# Patient Record
Sex: Female | Born: 1990 | Race: Black or African American | Hispanic: No | Marital: Single | Smoking: Former smoker
Health system: Southern US, Community
[De-identification: ages and names within clinical notes are randomized; demographics above are authoritative.]

## PROBLEM LIST (undated history)

## (undated) DIAGNOSIS — F4310 Post-traumatic stress disorder, unspecified: Secondary | ICD-10-CM

## (undated) DIAGNOSIS — Z789 Other specified health status: Secondary | ICD-10-CM

## (undated) DIAGNOSIS — F419 Anxiety disorder, unspecified: Secondary | ICD-10-CM

## (undated) DIAGNOSIS — F32A Depression, unspecified: Secondary | ICD-10-CM

## (undated) DIAGNOSIS — S4990XA Unspecified injury of shoulder and upper arm, unspecified arm, initial encounter: Secondary | ICD-10-CM

## (undated) HISTORY — DX: Depression, unspecified: F32.A

## (undated) HISTORY — PX: OTHER SURGICAL HISTORY: SHX169

## (undated) HISTORY — DX: Post-traumatic stress disorder, unspecified: F43.10

## (undated) HISTORY — PX: TONSILLECTOMY: SUR1361

## (undated) HISTORY — DX: Anxiety disorder, unspecified: F41.9

---

## 2012-08-28 ENCOUNTER — Emergency Department (HOSPITAL_COMMUNITY)
Admission: EM | Admit: 2012-08-28 | Discharge: 2012-08-29 | Disposition: A | Discharge status: Home / Self Care | Payer: Self-pay | Attending: Emergency Medicine | Admitting: Emergency Medicine

## 2012-08-28 ENCOUNTER — Encounter (HOSPITAL_COMMUNITY): Payer: Self-pay | Admitting: *Deleted

## 2012-08-28 DIAGNOSIS — F17200 Nicotine dependence, unspecified, uncomplicated: Secondary | ICD-10-CM | POA: Insufficient documentation

## 2012-08-28 DIAGNOSIS — R21 Rash and other nonspecific skin eruption: Secondary | ICD-10-CM | POA: Insufficient documentation

## 2012-08-28 MED ORDER — DIPHENHYDRAMINE HCL 25 MG PO CAPS
25.0000 mg | ORAL_CAPSULE | Freq: Once | ORAL | Status: AC
  Administered 2012-08-28: 25 mg via ORAL
  Filled 2012-08-28: qty 1

## 2012-08-28 MED ORDER — ACYCLOVIR 200 MG PO CAPS
400.0000 mg | ORAL_CAPSULE | Freq: Once | ORAL | Status: AC
  Administered 2012-08-28: 400 mg via ORAL
  Filled 2012-08-28: qty 2

## 2012-08-28 NOTE — ED Provider Notes (Signed)
History     CSN: [id number]  Arrival date & time 08/28/12  2151   First MD Initiated Contact with Patient 08/28/12 2307      Chief Complaint  Patient presents with  . Rash    (Consider location/radiation/quality/duration/timing/severity/associated sxs/prior treatment) HPI Comments: Patient with progressive rash on R arm, R breast and R upper back for the past 2 days they start as a small slightly raised area with a white tip than progress to a slightly larger raised bump with a clear fluid filled vesical that ruptures after several hours.   Denies fever, ST recent illness. Only new change is a tattoo to medial R upper arm   Patient is a 21 y.o. female presenting with rash. The history is provided by the patient.  Rash  This is a new problem. The current episode started yesterday. The problem has been gradually worsening. There has been no fever.    History reviewed. No pertinent past medical history.  History reviewed. No pertinent past surgical history.  No family history on file.  History  Substance Use Topics  . Smoking status: Current Some Day Smoker  . Smokeless tobacco: Not on file  . Alcohol Use: Yes    OB History    Grav Para Term Preterm Abortions TAB SAB Ect Mult Living                  Review of Systems  Constitutional: Negative for fever and chills.  HENT: Negative for sore throat.   Respiratory: Negative for shortness of breath.   Gastrointestinal: Negative for nausea.  Musculoskeletal: Negative for myalgias.  Skin: Positive for rash. Negative for wound.  Neurological: Negative for dizziness and headaches.    Allergies  Review of patient's allergies indicates no known allergies.  Home Medications   Current Outpatient Rx  Name  Route  Sig  Dispense  Refill  . HYDROCORTISONE 1 % EX CREA   Topical   Apply 1 application topically at bedtime as needed. For rash         . ACYCLOVIR 200 MG PO CAPS   Oral   Take 2 capsules (400 mg total) by  mouth 5 (five) times daily.   24 capsule   0     BP 115/68  Pulse 78  Temp 98.4 F (36.9 C) (Oral)  Resp 16  SpO2 100%  Physical Exam  Constitutional: She appears well-developed and well-nourished.  HENT:  Head: Normocephalic.  Eyes: Pupils are equal, round, and reactive to light.    Neck: Normal range of motion.  Cardiovascular: Normal rate.   Pulmonary/Chest: Effort normal.  Musculoskeletal: Normal range of motion.  Neurological: She is alert.  Skin: Rash noted. Rash is vesicular. No erythema.       ED Course  Procedures (including critical care time)  Labs Reviewed - No data to display No results found.   1. Rash       MDM  Vesicular rash will start acyclovir         Arman Filter, NP 08/29/12 0003

## 2012-08-28 NOTE — ED Notes (Signed)
Rash on her rt arm  For 2 days with lt eye redness also for one day.  Both itch

## 2012-08-29 ENCOUNTER — Encounter (HOSPITAL_COMMUNITY): Payer: Self-pay | Admitting: Emergency Medicine

## 2012-08-29 ENCOUNTER — Emergency Department (HOSPITAL_COMMUNITY)
Admission: EM | Admit: 2012-08-29 | Discharge: 2012-08-29 | Disposition: A | Discharge status: Home / Self Care | Payer: No Typology Code available for payment source | Attending: Emergency Medicine | Admitting: Emergency Medicine

## 2012-08-29 DIAGNOSIS — B958 Unspecified staphylococcus as the cause of diseases classified elsewhere: Secondary | ICD-10-CM | POA: Insufficient documentation

## 2012-08-29 DIAGNOSIS — F17200 Nicotine dependence, unspecified, uncomplicated: Secondary | ICD-10-CM | POA: Insufficient documentation

## 2012-08-29 MED ORDER — HYDROCODONE-ACETAMINOPHEN 5-325 MG PO TABS: 1.0000 | ORAL_TABLET | ORAL | Status: DC | PRN

## 2012-08-29 MED ORDER — ACYCLOVIR 200 MG PO CAPS: 400.0000 mg | ORAL_CAPSULE | Freq: Every day | ORAL | Status: DC

## 2012-08-29 MED ORDER — SULFAMETHOXAZOLE-TRIMETHOPRIM 800-160 MG PO TABS: 1.0000 | ORAL_TABLET | Freq: Two times a day (BID) | ORAL | Status: DC

## 2012-08-29 NOTE — ED Provider Notes (Signed)
History     CSN: [id number]  Arrival date & time 08/29/12  1845   First MD Initiated Contact with Patient 08/29/12 1913      No chief complaint on file.   (Consider location/radiation/quality/duration/timing/severity/associated sxs/prior treatment) Patient is a 21 y.o. female presenting with rash. The history is provided by the patient.  Rash  This is a new problem. The current episode started 2 days ago. There has been no fever. Associated symptoms comments: Painful rash to the right upper extremity. No swelling of extremity. No fever. She has a tattoo to the arm that was new 2 weeks ago. No fever.Marland Kitchen    History reviewed. No pertinent past medical history.  History reviewed. No pertinent past surgical history.  No family history on file.  History  Substance Use Topics  . Smoking status: Current Some Day Smoker  . Smokeless tobacco: Not on file  . Alcohol Use: Yes    OB History    Grav Para Term Preterm Abortions TAB SAB Ect Mult Living                  Review of Systems  Constitutional: Negative for fever and chills.  Respiratory: Negative.   Cardiovascular: Negative.   Skin: Positive for rash.  Neurological: Negative.     Allergies  Review of patient's allergies indicates no known allergies.  Home Medications   Current Outpatient Rx  Name  Route  Sig  Dispense  Refill  . ACYCLOVIR 200 MG PO CAPS   Oral   Take 400 mg by mouth 5 (five) times daily.         Marland Kitchen HYDROCORTISONE 1 % EX CREA   Topical   Apply 1 application topically at bedtime as needed. For rash           BP 99/60  Pulse 96  Temp 98.3 F (36.8 C)  Resp 20  SpO2 100%  LMP 08/01/2012  Physical Exam  Constitutional: She is oriented to person, place, and time. She appears well-developed and well-nourished.  Neck: Normal range of motion.  Pulmonary/Chest: Effort normal.  Neurological: She is alert and oriented to person, place, and time.  Skin: Skin is warm and dry.       Rash  consisting of singular, erythematous pustules that are tender. No crusting or drainage. Rash extends along volar surface mid-upper arm to mid-forearm. Most lesions are near the new tattoo.  Psychiatric: She has a normal mood and affect.    ED Course  Procedures (including critical care time)  Labs Reviewed - No data to display No results found.   No diagnosis found.  1. Staph infection   MDM  Patient placed on Acyclovir yesterday for ?early shingles. Today the rash appears concerning from staph infection. Will treat with oral abx and recommend topical supportive measures.         Rodena Medin, PA-C 08/29/12 2102

## 2012-08-29 NOTE — ED Provider Notes (Signed)
Medical screening examination/treatment/procedure(s) were performed by non-physician practitioner and as supervising physician I was immediately available for consultation/collaboration.  Maryanna Stuber R. Damyra Luscher, MD 08/29/12 0356 

## 2012-08-29 NOTE — ED Notes (Signed)
Pt has raised bumps on upper right arm that itch and are painful. Was seen yesterday at [facility].

## 2012-08-29 NOTE — ED Notes (Signed)
Pt started Acyclovir started today at 12pm.

## 2012-08-30 NOTE — ED Provider Notes (Signed)
Medical screening examination/treatment/procedure(s) were performed by non-physician practitioner and as supervising physician I was immediately available for consultation/collaboration.  Derwood Kaplan, MD 08/30/12 [phone]

## 2012-10-29 ENCOUNTER — Emergency Department (HOSPITAL_COMMUNITY)
Admission: EM | Admit: 2012-10-29 | Discharge: 2012-10-30 | Disposition: A | Discharge status: Home / Self Care | Payer: Self-pay | Attending: Emergency Medicine | Admitting: Emergency Medicine

## 2012-10-29 ENCOUNTER — Encounter (HOSPITAL_COMMUNITY): Payer: Self-pay | Admitting: *Deleted

## 2012-10-29 DIAGNOSIS — J40 Bronchitis, not specified as acute or chronic: Secondary | ICD-10-CM | POA: Insufficient documentation

## 2012-10-29 DIAGNOSIS — R079 Chest pain, unspecified: Secondary | ICD-10-CM | POA: Insufficient documentation

## 2012-10-29 DIAGNOSIS — R1110 Vomiting, unspecified: Secondary | ICD-10-CM | POA: Insufficient documentation

## 2012-10-29 DIAGNOSIS — Z3202 Encounter for pregnancy test, result negative: Secondary | ICD-10-CM | POA: Insufficient documentation

## 2012-10-29 DIAGNOSIS — Z87891 Personal history of nicotine dependence: Secondary | ICD-10-CM | POA: Insufficient documentation

## 2012-10-29 NOTE — ED Notes (Signed)
Pt c/o cough x 1.5 weeks.  States she coughs until he vomits and chest hurts.  Intermittent fevers at home.

## 2012-10-30 ENCOUNTER — Emergency Department (HOSPITAL_COMMUNITY): Payer: Self-pay

## 2012-10-30 LAB — POCT PREGNANCY, URINE: Preg Test, Ur: NEGATIVE

## 2012-10-30 LAB — PREGNANCY, URINE: Preg Test, Ur: NEGATIVE

## 2012-10-30 MED ORDER — HYDROCOD POLST-CHLORPHEN POLST 10-8 MG/5ML PO LQCR: 5.0000 mL | Freq: Two times a day (BID) | ORAL | Status: DC

## 2012-10-30 MED ORDER — HYDROCOD POLST-CHLORPHEN POLST 10-8 MG/5ML PO LQCR
5.0000 mL | Freq: Once | ORAL | Status: AC
  Administered 2012-10-30: 5 mL via ORAL
  Filled 2012-10-30: qty 5

## 2012-10-30 NOTE — ED Provider Notes (Signed)
History     CSN: [id number]  Arrival date & time 10/29/12  2339   First MD Initiated Contact with Patient 10/29/12 2354      Chief Complaint  Patient presents with  . Cough    (Consider location/radiation/quality/duration/timing/severity/associated sxs/prior treatment) HPI Comments: Patient states, that about a week ago.  She started with URI, symptoms, with a cough, nonproductive.  Her cold symptoms have improved, but the cough has been persistent, intermittent, not related to position.  She is coughing to the point where she is post tussive emesis and, now she's having upper bilateral chest discomfort with coughing.  Denies fever, history of, asthma, or smoking should not use any kind of birth control  Patient is a 22 y.o. female presenting with cough. The history is provided by the patient.  Cough This is a new problem. The current episode started more than 1 week ago. The problem occurs every few minutes. The cough is non-productive. There has been no fever. Associated symptoms include chest pain. Pertinent negatives include no shortness of breath and no wheezing. She has tried cough syrup for the symptoms. The treatment provided no relief.    History reviewed. No pertinent past medical history.  Past Surgical History  Procedure Date  . Tonsillectomy     History reviewed. No pertinent family history.  History  Substance Use Topics  . Smoking status: Former Games developer  . Smokeless tobacco: Not on file  . Alcohol Use: Yes    OB History    Grav Para Term Preterm Abortions TAB SAB Ect Mult Living                  Review of Systems  Constitutional: Negative for fever.  HENT: Negative.   Respiratory: Positive for cough. Negative for shortness of breath and wheezing.   Cardiovascular: Positive for chest pain.  Gastrointestinal: Positive for vomiting. Negative for nausea.  Genitourinary: Negative.   Musculoskeletal: Negative for back pain.  Neurological: Negative for  weakness and numbness.    Allergies  Review of patient's allergies indicates no known allergies.  Home Medications   Current Outpatient Rx  Name  Route  Sig  Dispense  Refill  . OVER THE COUNTER MEDICATION   Oral   Take 1 tablet by mouth every 6 (six) hours as needed. OTC cold medication         . HYDROCOD POLST-CPM POLST ER 10-8 MG/5ML PO LQCR   Oral   Take 5 mLs by mouth every 12 (twelve) hours.   140 mL   0     BP 105/65  Pulse 73  Temp 98.5 F (36.9 C) (Oral)  Resp 20  SpO2 100%  LMP 10/09/2012  Physical Exam  Constitutional: She appears well-developed.  HENT:  Head: Normocephalic.  Mouth/Throat: Oropharynx is clear and moist.  Eyes: Pupils are equal, round, and reactive to light.  Neck: Normal range of motion.  Cardiovascular: Normal rate.   Pulmonary/Chest: Effort normal and breath sounds normal. She has no wheezes. She exhibits tenderness.  Abdominal: Soft.  Musculoskeletal: Normal range of motion.  Neurological: She is alert.  Skin: Skin is warm.    ED Course  Procedures (including critical care time)   Labs Reviewed  PREGNANCY, URINE   Dg Chest 2 View  10/30/2012  *RADIOLOGY REPORT*  Clinical Data: Cough  CHEST - 2 VIEW  Comparison: None.  Findings: Lungs are clear. No pleural effusion or pneumothorax. The cardiomediastinal contours are within normal limits. The visualized bones and soft  tissues are without significant appreciable abnormality.  IMPRESSION: No radiographic evidence of acute cardiopulmonary process.   Original Report Authenticated By: Jearld Lesch, M.D.      1. Bronchitis       MDM  Her symptoms are most consistent with bronchitis.  Will obtain chest x-ray to limit the possibility of a pneumonia        Arman Filter, NP 10/30/12 [phone]

## 2012-10-30 NOTE — ED Provider Notes (Signed)
Medical screening examination/treatment/procedure(s) were performed by non-physician practitioner and as supervising physician I was immediately available for consultation/collaboration.  Tawonna Esquer, MD 10/30/12 0532 

## 2012-12-05 ENCOUNTER — Encounter (HOSPITAL_COMMUNITY): Payer: Self-pay | Admitting: Emergency Medicine

## 2012-12-05 ENCOUNTER — Emergency Department (HOSPITAL_COMMUNITY)
Admission: EM | Admit: 2012-12-05 | Discharge: 2012-12-05 | Disposition: A | Discharge status: Home / Self Care | Payer: Self-pay | Attending: Emergency Medicine | Admitting: Emergency Medicine

## 2012-12-05 DIAGNOSIS — N390 Urinary tract infection, site not specified: Secondary | ICD-10-CM | POA: Insufficient documentation

## 2012-12-05 DIAGNOSIS — R34 Anuria and oliguria: Secondary | ICD-10-CM | POA: Insufficient documentation

## 2012-12-05 DIAGNOSIS — Z87891 Personal history of nicotine dependence: Secondary | ICD-10-CM | POA: Insufficient documentation

## 2012-12-05 LAB — URINALYSIS, ROUTINE W REFLEX MICROSCOPIC
Bilirubin Urine: NEGATIVE
Ketones, ur: NEGATIVE mg/dL
Nitrite: NEGATIVE
Protein, ur: NEGATIVE mg/dL
Urobilinogen, UA: 0.2 mg/dL (ref 0.0–1.0)

## 2012-12-05 LAB — URINE MICROSCOPIC-ADD ON

## 2012-12-05 MED ORDER — CIPROFLOXACIN HCL 500 MG PO TABS: 500.0000 mg | ORAL_TABLET | Freq: Two times a day (BID) | ORAL | Status: DC

## 2012-12-05 MED ORDER — NAPROXEN 375 MG PO TABS: 375.0000 mg | ORAL_TABLET | Freq: Two times a day (BID) | ORAL | Status: DC

## 2012-12-05 NOTE — ED Notes (Addendum)
Pt reports burning after urination for the last few days. Denies fever.

## 2012-12-05 NOTE — ED Provider Notes (Signed)
History    This chart was scribed for non-physician practitioner working with Joya Gaskins, MD by Sofie Rower, ED Scribe. This patient was seen in room TR05C/TR05C and the patient's care was started at 4:21PM.   CSN: [id number]  Arrival date & time 12/05/12  1352   First MD Initiated Contact with Patient 12/05/12 1621      Chief Complaint  Patient presents with  . Dysuria    (Consider location/radiation/quality/duration/timing/severity/associated sxs/prior treatment) The history is provided by the patient. No language interpreter was used.    Rhonda [name] is a 22 y.o. female , with a hx of tonsilectomy, who presents to the Emergency Department complaining of sudden, progressively worsening, dysuria, onset yesterday (12/04/12). Associated symptoms include decreased urinary output. The pt reports she has been experiencing a painful, burning sensation while urinating, since yesterday (12/04/12). The pt has not taken any medications PTA to relieve her Dysuria.  The pt denies nausea, vomiting, back pain, and vaginal discharge. Furthermore, the pt denies any hx of allergies to any medications.  The pt does not smoke, however, she does drink alcohol.      History reviewed. No pertinent past medical history.  Past Surgical History  Procedure Laterality Date  . Tonsillectomy      History reviewed. No pertinent family history.  History  Substance Use Topics  . Smoking status: Former Games developer  . Smokeless tobacco: Not on file  . Alcohol Use: Yes    OB History   Grav Para Term Preterm Abortions TAB SAB Ect Mult Living                  Review of Systems  Genitourinary: Positive for dysuria.  All other systems reviewed and are negative.    Allergies  Review of patient's allergies indicates no known allergies.  Home Medications  No current outpatient prescriptions on file.  BP 105/71  Pulse 70  Temp(Src) 98.2 F (36.8 C) (Oral)  Resp 18  SpO2 100%  LMP  12/01/2012  Physical Exam  Nursing note and vitals reviewed. Constitutional: She is oriented to person, place, and time. She appears well-developed and well-nourished. No distress.  HENT:  Head: Normocephalic and atraumatic.  Eyes: EOM are normal. Pupils are equal, round, and reactive to light.  Neck: Neck supple. No tracheal deviation present.  Cardiovascular: Normal rate.   Pulmonary/Chest: Effort normal. No respiratory distress.  Abdominal: Soft. She exhibits no distension. There is no tenderness.  Musculoskeletal: Normal range of motion. She exhibits no edema.  Neurological: She is alert and oriented to person, place, and time. No sensory deficit.  Skin: Skin is warm and dry.  Psychiatric: She has a normal mood and affect. Her behavior is normal.    ED Course  Procedures (including critical care time)  DIAGNOSTIC STUDIES: Oxygen Saturation is 100% on room air, normal by my interpretation.    COORDINATION OF CARE:  4:42 PM- Treatment plan discussed with patient. Pt agrees with treatment.      Labs Reviewed  URINALYSIS, ROUTINE W REFLEX MICROSCOPIC - Abnormal; Notable for the following:    APPearance HAZY (*)    Hgb urine dipstick MODERATE (*)    Leukocytes, UA SMALL (*)    All other components within normal limits  URINE MICROSCOPIC-ADD ON - Abnormal; Notable for the following:    Squamous Epithelial / LPF FEW (*)    Bacteria, UA MANY (*)    All other components within normal limits  URINE CULTURE   No results found.  No diagnosis found.    MDM  UTI Pt has been diagnosed with a UTI. Pt is afebrile, no CVA tenderness, normotensive, and denies N/V. Pt to be dc home with antibiotics and instructions to follow up with PCP if symptoms persist.       I personally performed the services described in this documentation, which was scribed in my presence. The recorded information has been reviewed and is accurate.      Jaci Carrel, [place] 12/05/12 2302

## 2012-12-07 LAB — URINE CULTURE: Colony Count: >=100000

## 2012-12-07 NOTE — ED Provider Notes (Signed)
Medical screening examination/treatment/procedure(s) were performed by non-physician practitioner and as supervising physician I was immediately available for consultation/collaboration.   Joya Gaskins, MD 12/07/12 [phone]

## 2012-12-08 ENCOUNTER — Telehealth (HOSPITAL_COMMUNITY): Payer: Self-pay | Admitting: Emergency Medicine

## 2012-12-08 NOTE — ED Notes (Signed)
+  Urine. Patient treated with Cipro. Sensitive to same. Per protocol MD. °

## 2012-12-08 NOTE — ED Notes (Signed)
Patient has +Urine culture. Checking to see if treated appropriately. °

## 2013-01-18 ENCOUNTER — Emergency Department (HOSPITAL_COMMUNITY)
Admission: EM | Admit: 2013-01-18 | Discharge: 2013-01-18 | Disposition: A | Discharge status: Home / Self Care | Payer: PRIVATE HEALTH INSURANCE | Attending: Emergency Medicine | Admitting: Emergency Medicine

## 2013-01-18 ENCOUNTER — Encounter (HOSPITAL_COMMUNITY): Payer: Self-pay | Admitting: Emergency Medicine

## 2013-01-18 ENCOUNTER — Emergency Department (HOSPITAL_COMMUNITY): Payer: PRIVATE HEALTH INSURANCE

## 2013-01-18 DIAGNOSIS — Z87891 Personal history of nicotine dependence: Secondary | ICD-10-CM | POA: Insufficient documentation

## 2013-01-18 DIAGNOSIS — W230XXA Caught, crushed, jammed, or pinched between moving objects, initial encounter: Secondary | ICD-10-CM | POA: Insufficient documentation

## 2013-01-18 DIAGNOSIS — S61219A Laceration without foreign body of unspecified finger without damage to nail, initial encounter: Secondary | ICD-10-CM

## 2013-01-18 DIAGNOSIS — S6000XA Contusion of unspecified finger without damage to nail, initial encounter: Secondary | ICD-10-CM

## 2013-01-18 DIAGNOSIS — Y929 Unspecified place or not applicable: Secondary | ICD-10-CM | POA: Insufficient documentation

## 2013-01-18 DIAGNOSIS — Y939 Activity, unspecified: Secondary | ICD-10-CM | POA: Insufficient documentation

## 2013-01-18 DIAGNOSIS — S61209A Unspecified open wound of unspecified finger without damage to nail, initial encounter: Secondary | ICD-10-CM | POA: Insufficient documentation

## 2013-01-18 MED ORDER — HYDROCODONE-ACETAMINOPHEN 5-325 MG PO TABS: 1.0000 | ORAL_TABLET | ORAL | Status: DC | PRN

## 2013-01-18 MED ORDER — OXYCODONE-ACETAMINOPHEN 5-325 MG PO TABS
1.0000 | ORAL_TABLET | Freq: Once | ORAL | Status: AC
  Administered 2013-01-18: 1 via ORAL
  Filled 2013-01-18: qty 1
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
## 2013-01-19 NOTE — ED Provider Notes (Signed)
Medical screening examination/treatment/procedure(s) were performed by non-physician practitioner and as supervising physician I was immediately available for consultation/collaboration.  Raeford Razor, MD 01/19/13 [phone]

## 2013-01-26 ENCOUNTER — Emergency Department (HOSPITAL_COMMUNITY): Admission: EM | Admit: 2013-01-26 | Payer: Self-pay | Source: Home / Self Care

## 2013-02-01 ENCOUNTER — Encounter (HOSPITAL_COMMUNITY): Payer: Self-pay | Admitting: Emergency Medicine

## 2013-02-01 ENCOUNTER — Emergency Department (HOSPITAL_COMMUNITY)
Admission: EM | Admit: 2013-02-01 | Discharge: 2013-02-01 | Disposition: A | Discharge status: Home / Self Care | Payer: No Typology Code available for payment source | Attending: Emergency Medicine | Admitting: Emergency Medicine

## 2013-02-01 DIAGNOSIS — Z4802 Encounter for removal of sutures: Secondary | ICD-10-CM | POA: Insufficient documentation

## 2013-02-01 DIAGNOSIS — Z87891 Personal history of nicotine dependence: Secondary | ICD-10-CM | POA: Insufficient documentation

## 2013-02-01 NOTE — ED Notes (Signed)
Pt alert, returns to have stiches removed from left hand, area appears healed, no s/s of infection noted

## 2013-02-01 NOTE — ED Provider Notes (Signed)
History    This chart was scribed for non-physician practitioner Francee Piccolo working with Lyanne Co, MD by Quintella Reichert, ED Scribe. This patient was seen in room WTR7/WTR7 and the patient's care was started at 10:32 PM .   CSN: [id number]  Arrival date & time 02/01/13  2109      Chief Complaint  Patient presents with  . Suture / Staple Removal    The history is provided by the patient. No language interpreter was used.   Rhonda [name] is a 22 y.o. female who presents to the Emergency Department for suture removal.  Pt had sutures placed in ED on 01/18/2013, and is returning today to have them removed.  She reports mild soreness in the site, but denies numbness, tingling, or weakness in the area.  She has attempted to use warm soaks to relieve pain, with no relief.  Pt denies fever, chills, CP, SOB, abdominal pain, nausea, emesis, diarrhea, weakness, numbness or any other associated symptoms.     History reviewed. No pertinent past medical history.  Past Surgical History  Procedure Laterality Date  . Tonsillectomy      No family history on file.  History  Substance Use Topics  . Smoking status: Former Games developer  . Smokeless tobacco: Not on file  . Alcohol Use: Yes    OB History   Grav Para Term Preterm Abortions TAB SAB Ect Mult Living                  Review of Systems  Constitutional: Negative for fever and chills.  Respiratory: Negative for shortness of breath.   Cardiovascular: Negative for chest pain.  Gastrointestinal: Negative for nausea, vomiting, abdominal pain and diarrhea.  Skin:       Sutures in left hand  Neurological: Negative for weakness and numbness.  All other systems reviewed and are negative.     Allergies  Review of patient's allergies indicates no known allergies.  Home Medications   Current Outpatient Rx  Name  Route  Sig  Dispense  Refill  . ibuprofen (ADVIL,MOTRIN) 200 MG tablet   Oral   Take 600-800 mg by mouth every  8 (eight) hours as needed for pain.         Marland Kitchen HYDROcodone-acetaminophen (NORCO/VICODIN) 5-325 MG per tablet   Oral   Take 1 tablet by mouth every 6 (six) hours as needed for pain.           BP 99/58  Pulse 80  Temp(Src) 98 F (36.7 C)  Resp 16  Ht 5\' 5"  (1.651 m)  Wt 120 lb (54.432 kg)  BMI 19.97 kg/m2  SpO2 99%  LMP 01/29/2013  Physical Exam  Nursing note and vitals reviewed. Constitutional: She is oriented to person, place, and time. She appears well-developed and well-nourished. No distress.  HENT:  Head: Normocephalic and atraumatic.  Eyes: EOM are normal.  Neck: Neck supple. No tracheal deviation present.  Cardiovascular: Normal rate.   Pulmonary/Chest: Effort normal. No respiratory distress.  Musculoskeletal: Normal range of motion.  Neurological: She is alert and oriented to person, place, and time.  Skin: Skin is warm and dry.  Wounds CDI  Psychiatric: She has a normal mood and affect. Her behavior is normal.     ED Course  Procedures (including critical care time)  DIAGNOSTIC STUDIES: Oxygen Saturation is 99% on room air, normal by my interpretation.    COORDINATION OF CARE: 10:33 PM-Began to perform suture removal procedure.  Pt reported that procedure  was too painful, and refused to continue before completion.  Recommended that sutures be removed.  Offered medication for pain and anxiety management in order that procedure may be better-tolerated.  Pt declined. Pt advised she should have the stitches removed to reduce rate of infection or other complications from leaving the sutures in for prolonged period of time. Pt verbalizes understanding and still declines removal.     Labs Reviewed - No data to display No results found.   1. Visit for suture removal       MDM  Pt returns to have sutures attempted to be removed for second time. Left hand wound appears to be healed w/o signs of infection. Attempt was made to remove sutures, but d/t discomfort  pt declined to continue with suture removal. Offered medication for pain and anxiety management in order that procedure may be better-tolerated.  Pt declined. Pt advised she should have the stitches removed to reduce rate of infection or other complications from leaving the sutures in for prolonged period of time. Pt verbalizes understanding and still declines removal. Patient is stable at time of discharge        I personally performed the services described in this documentation, which was scribed in my presence. The recorded information has been reviewed and is accurate.     Lise Auer Athanasia Stanwood, PA-C 02/02/13 0101

## 2013-02-04 NOTE — ED Provider Notes (Signed)
Medical screening examination/treatment/procedure(s) were performed by non-physician practitioner and as supervising physician I was immediately available for consultation/collaboration.   Tomorrow Dehaas M Deysi Soldo, MD 02/04/13 0006 

## 2013-03-14 ENCOUNTER — Emergency Department (HOSPITAL_COMMUNITY)
Admission: EM | Admit: 2013-03-14 | Discharge: 2013-03-14 | Disposition: A | Discharge status: Home / Self Care | Payer: No Typology Code available for payment source | Attending: Emergency Medicine | Admitting: Emergency Medicine

## 2013-03-14 ENCOUNTER — Encounter (HOSPITAL_COMMUNITY): Payer: Self-pay | Admitting: *Deleted

## 2013-03-14 DIAGNOSIS — S161XXA Strain of muscle, fascia and tendon at neck level, initial encounter: Secondary | ICD-10-CM

## 2013-03-14 DIAGNOSIS — Z87891 Personal history of nicotine dependence: Secondary | ICD-10-CM | POA: Insufficient documentation

## 2013-03-14 DIAGNOSIS — S139XXA Sprain of joints and ligaments of unspecified parts of neck, initial encounter: Secondary | ICD-10-CM | POA: Insufficient documentation

## 2013-03-14 DIAGNOSIS — Y92410 Unspecified street and highway as the place of occurrence of the external cause: Secondary | ICD-10-CM | POA: Insufficient documentation

## 2013-03-14 DIAGNOSIS — S0990XA Unspecified injury of head, initial encounter: Secondary | ICD-10-CM | POA: Insufficient documentation

## 2013-03-14 DIAGNOSIS — Y9389 Activity, other specified: Secondary | ICD-10-CM | POA: Insufficient documentation

## 2013-03-14 MED ORDER — IBUPROFEN 400 MG PO TABS: 600.0000 mg | ORAL_TABLET | Freq: Once | ORAL | Status: DC

## 2013-03-14 MED ORDER — IBUPROFEN 400 MG PO TABS
600.0000 mg | ORAL_TABLET | Freq: Once | ORAL | Status: AC
  Administered 2013-03-14: 600 mg via ORAL
  Filled 2013-03-14: qty 2

## 2013-03-14 MED ORDER — IBUPROFEN 600 MG PO TABS: 600.0000 mg | ORAL_TABLET | Freq: Three times a day (TID) | ORAL | Status: DC | PRN

## 2013-03-14 MED ORDER — OXYCODONE-ACETAMINOPHEN 5-325 MG PO TABS
1.0000 | ORAL_TABLET | Freq: Once | ORAL | Status: AC
  Administered 2013-03-14: 1 via ORAL
  Filled 2013-03-14: qty 1
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
## 2014-01-18 ENCOUNTER — Encounter (HOSPITAL_COMMUNITY): Payer: Self-pay | Admitting: Emergency Medicine

## 2014-01-18 ENCOUNTER — Emergency Department (HOSPITAL_COMMUNITY)
Admission: EM | Admit: 2014-01-18 | Discharge: 2014-01-18 | Disposition: A | Discharge status: Home / Self Care | Payer: No Typology Code available for payment source | Attending: Emergency Medicine | Admitting: Emergency Medicine

## 2014-01-18 DIAGNOSIS — E860 Dehydration: Secondary | ICD-10-CM | POA: Insufficient documentation

## 2014-01-18 DIAGNOSIS — Z87891 Personal history of nicotine dependence: Secondary | ICD-10-CM | POA: Insufficient documentation

## 2014-01-18 DIAGNOSIS — K2920 Alcoholic gastritis without bleeding: Secondary | ICD-10-CM

## 2014-01-18 DIAGNOSIS — Z3202 Encounter for pregnancy test, result negative: Secondary | ICD-10-CM | POA: Insufficient documentation

## 2014-01-18 LAB — URINALYSIS, ROUTINE W REFLEX MICROSCOPIC
Bilirubin Urine: NEGATIVE
Glucose, UA: NEGATIVE mg/dL
KETONES UR: 40 mg/dL — AB
Leukocytes, UA: NEGATIVE
NITRITE: NEGATIVE
Protein, ur: NEGATIVE mg/dL
Specific Gravity, Urine: 1.014 (ref 1.005–1.030)
UROBILINOGEN UA: 0.2 mg/dL (ref 0.0–1.0)
pH: 6.5 (ref 5.0–8.0)

## 2014-01-18 LAB — COMPREHENSIVE METABOLIC PANEL
ALBUMIN: 4.3 g/dL (ref 3.5–5.2)
ALT: 43 U/L — ABNORMAL HIGH (ref 0–35)
AST: 52 U/L — ABNORMAL HIGH (ref 0–37)
Alkaline Phosphatase: 65 U/L (ref 39–117)
BILIRUBIN TOTAL: 0.4 mg/dL (ref 0.3–1.2)
BUN: 9 mg/dL (ref 6–23)
CHLORIDE: 103 meq/L (ref 96–112)
CO2: 22 mEq/L (ref 19–32)
CREATININE: 0.75 mg/dL (ref 0.50–1.10)
Calcium: 9.8 mg/dL (ref 8.4–10.5)
GFR calc non Af Amer: >90 mL/min (ref >90)
GLUCOSE: 91 mg/dL (ref 70–99)
Potassium: 4.1 mEq/L (ref 3.7–5.3)
Sodium: 143 mEq/L (ref 137–147)
Total Protein: 7.6 g/dL (ref 6.0–8.3)

## 2014-01-18 LAB — CBC WITH DIFFERENTIAL/PLATELET
BASOS PCT: 0 % (ref 0–1)
Basophils Absolute: 0 10*3/uL (ref 0.0–0.1)
Eosinophils Absolute: 0 10*3/uL (ref 0.0–0.7)
Eosinophils Relative: 0 % (ref 0–5)
HCT: 40 % (ref 36.0–46.0)
HEMOGLOBIN: 14.2 g/dL (ref 12.0–15.0)
Lymphocytes Relative: 8 % — ABNORMAL LOW (ref 12–46)
Lymphs Abs: 1.4 10*3/uL (ref 0.7–4.0)
MCH: 31.3 pg (ref 26.0–34.0)
MCHC: 35.5 g/dL (ref 30.0–36.0)
MCV: 88.1 fL (ref 78.0–100.0)
MONO ABS: 0.4 10*3/uL (ref 0.1–1.0)
Monocytes Relative: 2 % — ABNORMAL LOW (ref 3–12)
Neutro Abs: 14.8 10*3/uL — ABNORMAL HIGH (ref 1.7–7.7)
Neutrophils Relative %: 89 % — ABNORMAL HIGH (ref 43–77)
Platelets: 316 10*3/uL (ref 150–400)
RBC: 4.54 MIL/uL (ref 3.87–5.11)
RDW: 12.6 % (ref 11.5–15.5)
WBC: 16.6 10*3/uL — ABNORMAL HIGH (ref 4.0–10.5)

## 2014-01-18 LAB — POC URINE PREG, ED: Preg Test, Ur: NEGATIVE

## 2014-01-18 LAB — LIPASE, BLOOD: LIPASE: 14 U/L (ref 11–59)

## 2014-01-18 LAB — URINE MICROSCOPIC-ADD ON

## 2014-01-18 MED ORDER — MORPHINE SULFATE 4 MG/ML IJ SOLN
4.0000 mg | Freq: Once | INTRAMUSCULAR | Status: AC
  Administered 2014-01-18: 4 mg via INTRAVENOUS
  Filled 2014-01-18: qty 1

## 2014-01-18 MED ORDER — ONDANSETRON HCL 4 MG/2ML IJ SOLN
4.0000 mg | Freq: Once | INTRAMUSCULAR | Status: AC
  Administered 2014-01-18: 4 mg via INTRAVENOUS
  Filled 2014-01-18: qty 2

## 2014-01-18 MED ORDER — SODIUM CHLORIDE 0.9 % IV BOLUS (SEPSIS)
1000.0000 mL | Freq: Once | INTRAVENOUS | Status: AC
  Administered 2014-01-18: 1000 mL via INTRAVENOUS

## 2014-01-18 NOTE — ED Notes (Signed)
Patient tolerating po well.

## 2014-01-18 NOTE — ED Notes (Signed)
Pt states that she drank too much last night and began throwing up this morning.

## 2014-01-18 NOTE — Discharge Instructions (Signed)
Dehydration, Adult Dehydration means your body does not have as much fluid as it needs. Your kidneys, brain, and heart will not work properly without the right amount of fluids and salt.  HOME CARE  Ask your doctor how to replace body fluid losses (rehydrate).  Drink enough fluids to keep your pee (urine) clear or pale yellow.  Drink small amounts of fluids often if you feel sick to your stomach (nauseous) or throw up (vomit).  Eat like you normally do.  Avoid:  Foods or drinks high in sugar.  Bubbly (carbonated) drinks.  Juice.  Very hot or cold fluids.  Drinks with caffeine.  Fatty, greasy foods.  Alcohol.  Tobacco.  Eating too much.  Gelatin desserts.  Wash your hands to avoid spreading germs (bacteria, viruses).  Only take medicine as told by your doctor.  Keep all doctor visits as told. GET HELP RIGHT AWAY IF:   You cannot drink something without throwing up.  You get worse even with treatment.  Your vomit has blood in it or looks greenish.  Your poop (stool) has blood in it or looks black and tarry.  You have not peed in 6 to 8 hours.  You pee a small amount of very dark pee.  You have a fever.  You pass out (faint).  You have belly (abdominal) pain that gets worse or stays in one spot (localizes).  You have a rash, stiff neck, or bad headache.  You get easily annoyed, sleepy, or are hard to wake up.  You feel weak, dizzy, or very thirsty. MAKE SURE YOU:   Understand these instructions.  Will watch your condition.  Will get help right away if you are not doing well or get worse. Document Released: 07/16/2009 Document Revised: 12/12/2011 Document Reviewed: 05/09/2011 [facility] Patient Information 2014 [place], [place].  Gastritis, Adult Gastritis is soreness and puffiness (inflammation) of the lining of the stomach. If you do not get help, gastritis can cause bleeding and sores (ulcers) in the stomach. HOME CARE   Only take medicine as  told by your doctor.  If you were given antibiotic medicines, take them as told. Finish the medicines even if you start to feel better.  Drink enough fluids to keep your pee (urine) clear or pale yellow.  Avoid foods and drinks that make your problems worse. Foods you may want to avoid include:  Caffeine or alcohol.  Chocolate.  Mint.  Garlic and onions.  Spicy foods.  Citrus fruits, including oranges, lemons, or limes.  Food containing tomatoes, including sauce, chili, salsa, and pizza.  Fried and fatty foods.  Eat small meals throughout the day instead of large meals. GET HELP RIGHT AWAY IF:   You have black or dark red poop (stools).  You throw up (vomit) blood. It may look like coffee grounds.  You cannot keep fluids down.  Your belly (abdominal) pain gets worse.  You have a fever.  You do not feel better after 1 week.  You have any other questions or concerns. MAKE SURE YOU:   Understand these instructions.  Will watch your condition.  Will get help right away if you are not doing well or get worse. Document Released: 03/07/2008 Document Revised: 12/12/2011 Document Reviewed: 11/02/2011 [facility] Patient Information 2014 [place] Summit[place]Care, [place]LLC.   Emergency Department Resource Guide 1) Find a Doctor and Pay Out of Pocket Although you won't have to find out who is covered by your insurance plan, it is a good idea to ask around and get  recommendations. You will then need to call the office and see if the doctor you have chosen will accept you as a new patient and what types of options they offer for patients who are self-pay. Some doctors offer discounts or will set up payment plans for their patients who do not have insurance, but you will need to ask so you aren't surprised when you get to your appointment.  2) Contact Your Local Health Department Not all health departments have doctors that can see patients for sick visits, but many do, so it is worth a call to  see if yours does. If you don't know where your local health department is, you can check in your phone book. The CDC also has a tool to help you locate your state's health department, and many state websites also have listings of all of their local health departments.  3) Find a Walk-in Clinic If your illness is not likely to be very severe or complicated, you may want to try a walk in clinic. These are popping up all over the country in pharmacies, drugstores, and shopping centers. They're usually staffed by nurse practitioners or physician assistants that have been trained to treat common illnesses and complaints. They're usually fairly quick and inexpensive. However, if you have serious medical issues or chronic medical problems, these are probably not your best option.  No Primary Care Doctor: - Call Health Connect at  [phone] - they can help you locate a primary care doctor that  accepts your insurance, provides certain services, etc. - Physician Referral Service- [phone]  Chronic Pain Problems: Organization         Address  Phone   Notes  [facility]  [phone] Patients need to be referred by their primary care doctor.   Medication Assistance: Organization         Address  Phone   Notes  [facility] Medication [facility] [address] [place] [place], [place] [address] [phone] --Must be a resident of [facility] -- Must have NO insurance coverage whatsoever (no Medicaid/ Medicare, etc.) -- The pt. MUST have a primary care doctor that directs their care regularly and follows them in the community   Med[facility]ist  [phone]   [employer]  [phone]    Agencies that provide inexpensive medical care: Organization         Address  Phone   Notes  [facility]  [phone]   [facility]    [phone]   [facility] [address] [place], [place] [address] [phone]   [facility] [address] [place], [place] [phone]   Planned Parenthood    [phone]   [name]    [phone]   [facility]  [address], [place] Phone:  [phone], Fax:  [phone] Hours of Operation:  9 am - 6 pm, M-F.  Also accepts Medicaid/Medicare and self-pay.  [facility]  [address], [place] Phone: [phone], Fax: [phone]. Hours of Operation:  8:30 am - 5:30 pm, M-F.  Also accepts Medicaid and self-pay.  [facility] [place] [address], [place] Phone: [phone]   [facility] [address] Natasha Bence [place], [place] [phone], Ext. [phone] Mondays & Thursdays: 7-9 AM.  First 15 patients are seen on a first  come, first serve basis.    Medicaid-accepting [facility] Providers:  Organization         Address  Phone   Notes  [facility] [address], [place] [phone] Also accepts self-pay patients.  [facility] [address] [name] [place], [place]  [phone]   [facility] [address], [place] [phone]   [facility] [address], [place] [phone]   Renaye Rakers [address], [place]   [phone] Only accepts [place] Access [place] patients after they have their name applied to their card.   Self-Pay (no insurance) in [facility]:  Organization         Address  Phone   Notes  Sickle Cell Patients, [facility] [address] [place], [place] [phone]   [facility] [address] [place], [place] [phone]   [facility] [place]  [address], [place] [phone]   [facility]/Dr. Osei-Bonsu  [address], [place] or [address], [place] [phone] Phone number for both [place] and [place] locations is the same.  [facility] [address] [place] [phone]   [facility] [address] [place] or [address] [phone]   [facility] [address], [place] [phone], phone; [phone], fax Sees patients 1st and 3rd Saturday of every month.  Must not qualify for public or private insurance (i.e. Medicaid, Medicare, [place] Health Choice, Veterans' Benefits)  Household income should be no more than 200% of the poverty level The clinic cannot treat you if you are pregnant or think you are pregnant  Sexually transmitted diseases are not treated at the clinic.    Dental Care: Organization         Address  Phone  Notes  [facility] [address] [place], [place] [phone] Accepts children up to age 58 who are enrolled in [place] or [place] Health Choice; pregnant women with a Medicaid card; and children who have applied for Medicaid or [place] Health Choice, but were declined, whose parents can pay a reduced fee at time of service.  [facility]  [address], [place] [phone] Accepts children up to age 73 who are enrolled in [place] or [place] Health Choice; pregnant women with a Medicaid card; and children who have applied for Medicaid or [place] Health Choice, but were declined, whose parents can pay a reduced fee at time of service.  [name]  [address] [place], [place] [phone] Patients are seen by appointment only. Walk-ins are not accepted. [name] will see patients 92 years of age and older. Monday - Tuesday (8am-5pm) Most Wednesdays (8:30-5pm) $30 per visit, cash only  [facility] Adult [facility] Access PRO[facility]  [address], [facility] [phone] Patients are  seen by appointment only. Walk-ins are not accepted. [name] will see patients 2 years of age and older. One Wednesday Evening (Monthly: Volunteer Based).  $30 per visit, cash only  [employer]  [phone] for adults; Children under age 62, call Graduate Pediatric Dentistry at [phone]. Children aged 69-14,  please call [phone] to request a pediatric application.  Dental services are provided in all areas of dental care including fillings, crowns and bridges, complete and partial dentures, implants, gum treatment, root canals, and extractions. Preventive care is also provided. Treatment is provided to both adults and children. Patients are selected via a lottery and there is often a waiting list.   [facility] [address], [place]  [phone] [email or web address]   [facility] [address] [place], [place] [phone], Ext. [phone] Second and Fourth Thursday of each month, opens at 6:30 AM; Clinic ends at 9 AM.  Patients are seen on a first-come first-served basis, and a limited number are seen during each clinic.   [facility]  [address] Griffins [place], [place] [phone]   Eligibility Requirements You must have lived in [place], [place], or [place] counties for at least the last three months.   You cannot be eligible for state or federal sponsored [place], including [employer], [place], or [employer].   You generally cannot be eligible for healthcare insurance through your employer.    How to apply: Eligibility screenings are held every Tuesday and Wednesday afternoon from 1:00 pm until 4:00 pm. You do not need an appointment for the interview!  [facility] [address], [place], [place] [phone]   [facility] Health Department  [phone]   [facility] Health Department  [phone]   [facility] Health Department  [phone]     Behavioral Health Resources in the Community: Intensive Outpatient Programs Organization         Address  Phone  Notes  [facility] [address] [place], [place] [phone]   [facility] Outpatient [address] [place], [place] [phone]   [facility] [address] [place], [place]  [phone]   [facility] Mental Health [address]  [place], [place] [phone] or [phone]   Substance Abuse Resources Organization         Address  Phone  Notes  [facility] Drug Services  [phone]   [facility]  [phone]   [facility]  [phone]   Floydene Flock  [phone]   Residential & Outpatient Substance Abuse Program  [phone]   Psychological Services Organization         Address  Phone  Notes  [facility] Behavioral Health  [phone]   [facility]  [phone]   [facility] Mental Health [address], [place] [phone] or [phone]    Mobile Crisis Teams Organization         Address  Phone  Notes  [facility], Mobile Crisis Care Unit  [phone]   [facility]  [address] [place], [place] [phone]   Doristine Locks [address] [place] [phone]    Self-Help/Support Groups Organization         Address  Phone             Notes  [facility] [place] - variety of support groups  [phone]- [phone] Call for more information  Narcotics Anonymous (NA), [facility] [address], [employer] [place]  2 meetings at this location   Statistician         Address  Phone  Notes  [facility] [address],    [place]  [phone]   [facility]  [address], [place] [phone], [place], [place] [phone]   [facility] [address], [place] [phone] Admissions: 8am-3pm M-F  Incentives  [facility] Treatment [facility] [address]    [place], [place] [phone]   [facility] [address], [place], [place] [phone]   [facility] [address]  [place], [place] [phone]   [facility] - Intensive Outpatient [address], [place], [place] [phone]   [facility] ([facility] [address]  [place], [place] [phone] or [phone]   [facility] (RTS) [address] [place], [place] [phone] Accepts Medicaid  [facility] [address]  [place] [phone] Substance Abuse/Addiction Treatment   [facility] Behavioral Health Resources Organization         Address  Phone  Notes  [facility]  [phone](888) [phone]   Angie FavaJulie Brannon, PhD [address] [place], [place]   [phone] or [phone]) [phone]   [facility]   [address] [place], [place] [phone]   [facility] [address], [place], [place] [phone]) [phone] Insurance/Medicaid/sponsorship through [facility]  [facility] [address]                                    [place], [place] [phone] Therapy/tele-psych/case  [facility] [address]   [place], [place] [phone]    Dr. Lolly MustacheArfeen  [phone]) [phone]   [facility]  [employer] [facility] 1) [address], [place] 2) [address], [place] 3)  [address], [place] [phone]) [phone]  [phone]) [phone]   [facility] Child Abuse Hotline [phone](336) [phone] or [phone]) [phone] (After Hours)

## 2014-01-18 NOTE — ED Provider Notes (Addendum)
CSN: [id number]     Arrival date & time 01/18/14  1444 History   First MD Initiated Contact with Patient 01/18/14 1459     Chief Complaint  Patient presents with  . Nausea  . Emesis     (Consider location/radiation/quality/duration/timing/severity/associated sxs/prior Treatment) Patient is a 23 y.o. female presenting with vomiting. The history is provided by the patient.  Emesis Severity:  Severe Duration:  8 hours Timing:  Constant Number of daily episodes:  Numerous Quality:  Stomach contents Progression:  Unchanged Chronicity:  New Recent urination:  Normal Relieved by:  Nothing Worsened by:  Liquids Associated symptoms: abdominal pain and diarrhea   Associated symptoms: no chills, no cough, no fever and no headaches   Risk factors: alcohol use   Risk factors: no diabetes, not pregnant now, no prior abdominal surgery, no sick contacts and no suspect food intake     History reviewed. No pertinent past medical history. Past Surgical History  Procedure Laterality Date  . Tonsillectomy     No family history on file. History  Substance Use Topics  . Smoking status: Former Games developermoker  . Smokeless tobacco: Not on file  . Alcohol Use: No   OB History   Grav Para Term Preterm Abortions TAB SAB Ect Mult Living                 Review of Systems  Constitutional: Negative for chills.  Gastrointestinal: Positive for vomiting, abdominal pain and diarrhea.  Neurological: Negative for headaches.  All other systems reviewed and are negative.     Allergies  Review of patient's allergies indicates no known allergies.  Home Medications   Prior to Admission medications   Medication Sig Start Date End Date Taking? Authorizing Provider  ibuprofen (ADVIL,MOTRIN) 600 MG tablet Take 1 tablet (600 mg total) by mouth every 8 (eight) hours as needed for pain. 03/14/13   Lyanne CoKevin M Campos, MD   BP 126/87  Pulse 60  Temp(Src) 96.2 F (35.7 C) (Rectal)  Resp 18  SpO2 100%  LMP  01/15/2014 Physical Exam  Nursing note and vitals reviewed. Constitutional: She is oriented to person, place, and time. She appears well-developed and well-nourished. No distress.  HENT:  Head: Normocephalic and atraumatic.  Mouth/Throat: Oropharynx is clear and moist. Mucous membranes are dry.  Eyes: Conjunctivae and EOM are normal. Pupils are equal, round, and reactive to light.  Neck: Normal range of motion. Neck supple.  Cardiovascular: Normal rate, regular rhythm and intact distal pulses.   No murmur heard. Pulmonary/Chest: Effort normal and breath sounds normal. No respiratory distress. She has no wheezes. She has no rales.  Abdominal: Soft. She exhibits no distension. There is tenderness in the epigastric area. There is no rebound and no guarding.  Musculoskeletal: Normal range of motion. She exhibits no edema and no tenderness.  Neurological: She is alert and oriented to person, place, and time.  Skin: No rash noted. No erythema.  Cool and clammy  Psychiatric: She has a normal mood and affect. Her behavior is normal.    ED Course  Procedures (including critical care time) Labs Review Labs Reviewed  CBC WITH DIFFERENTIAL - Abnormal; Notable for the following:    WBC 16.6 (*)    Neutrophils Relative % 89 (*)    Neutro Abs 14.8 (*)    Lymphocytes Relative 8 (*)    Monocytes Relative 2 (*)    All other components within normal limits  COMPREHENSIVE METABOLIC PANEL - Abnormal; Notable for the following:  AST 52 (*)    ALT 43 (*)    All other components within normal limits  URINALYSIS, ROUTINE W REFLEX MICROSCOPIC - Abnormal; Notable for the following:    APPearance CLOUDY (*)    Hgb urine dipstick LARGE (*)    Ketones, ur 40 (*)    All other components within normal limits  LIPASE, BLOOD  URINE MICROSCOPIC-ADD ON  POC URINE PREG, ED    Imaging Review No results found.   EKG Interpretation None      MDM   Final diagnoses:  None    Pt states she drank  4-5 or more mixed drinks last night and when she woke up this am she started vomiting with nausea.  Also c/o of epigastric pain.  Symptomatically stable but appears dehydrated. She has mild epigastric tenderness but no left upper quadrant or right upper corner tenderness concerning for pancreatitis or cholecystitis. History several days ago with no suspicion for pregnancy. Will hydrate patient and po challenge.  5:15 PM Pt po challenged and started vomiting again. Will get CBC, CMP and lipase, UA and UPT.  Given second round of zofran and morphine this time.  7:22 PM Labs significant for leukocytosis and mild transaminitis.  On repeat eval pt feeling much better.  Will po challenge.   9:15 PM Pt po challenged adn feels much better.  Will d/c home.  She is also requestiong information and resources about anxiety. Gwyneth SproutWhitney Derk Doubek, MD 01/18/14 1716  Gwyneth SproutWhitney Arryana Tolleson, MD 01/18/14 [id number]  Gwyneth SproutWhitney Camillia Marcy, MD 01/18/14 2115

## 2014-09-29 ENCOUNTER — Encounter (HOSPITAL_COMMUNITY): Payer: Self-pay | Admitting: *Deleted

## 2014-09-29 ENCOUNTER — Inpatient Hospital Stay (HOSPITAL_COMMUNITY)
Admission: AD | Admit: 2014-09-29 | Discharge: 2014-09-30 | Disposition: A | Discharge status: Home / Self Care | Payer: No Typology Code available for payment source | Source: Ambulatory Visit | Attending: Obstetrics & Gynecology | Admitting: Obstetrics & Gynecology

## 2014-09-29 DIAGNOSIS — S39012A Strain of muscle, fascia and tendon of lower back, initial encounter: Secondary | ICD-10-CM | POA: Insufficient documentation

## 2014-09-29 DIAGNOSIS — B9689 Other specified bacterial agents as the cause of diseases classified elsewhere: Secondary | ICD-10-CM

## 2014-09-29 DIAGNOSIS — Z87891 Personal history of nicotine dependence: Secondary | ICD-10-CM | POA: Insufficient documentation

## 2014-09-29 DIAGNOSIS — N760 Acute vaginitis: Secondary | ICD-10-CM | POA: Insufficient documentation

## 2014-09-29 HISTORY — DX: Other specified health status: Z78.9

## 2014-09-29 LAB — POCT PREGNANCY, URINE: Preg Test, Ur: NEGATIVE

## 2014-09-29 NOTE — MAU Note (Signed)
PT SAYS SHE HAS HAD ABD  PAIN -  STARTED IN NOV-   STARTED   WITH HER  BACK-    NO MED.   HAS NOT  SEEN  DR.  LAST SEX-    YESTERDAY-    NO BIRTH  CONTROL/

## 2014-09-30 ENCOUNTER — Encounter (HOSPITAL_COMMUNITY): Payer: Self-pay | Admitting: *Deleted

## 2014-09-30 DIAGNOSIS — N760 Acute vaginitis: Secondary | ICD-10-CM

## 2014-09-30 DIAGNOSIS — A499 Bacterial infection, unspecified: Secondary | ICD-10-CM

## 2014-09-30 DIAGNOSIS — M545 Low back pain: Secondary | ICD-10-CM

## 2014-09-30 LAB — WET PREP, GENITAL
Trich, Wet Prep: NONE SEEN
WBC, Wet Prep HPF POC: NONE SEEN
YEAST WET PREP: NONE SEEN

## 2014-09-30 LAB — URINALYSIS, ROUTINE W REFLEX MICROSCOPIC
BILIRUBIN URINE: NEGATIVE
Glucose, UA: NEGATIVE mg/dL
KETONES UR: NEGATIVE mg/dL
Leukocytes, UA: NEGATIVE
Nitrite: NEGATIVE
PH: 6 (ref 5.0–8.0)
Protein, ur: NEGATIVE mg/dL
SPECIFIC GRAVITY, URINE: 1.02 (ref 1.005–1.030)
Urobilinogen, UA: 0.2 mg/dL (ref 0.0–1.0)

## 2014-09-30 LAB — GC/CHLAMYDIA PROBE AMP
CT Probe RNA: NEGATIVE
GC Probe RNA: NEGATIVE

## 2014-09-30 LAB — HIV ANTIBODY (ROUTINE TESTING W REFLEX): HIV: NONREACTIVE

## 2014-09-30 LAB — URINE MICROSCOPIC-ADD ON

## 2014-09-30 MED ORDER — METRONIDAZOLE 500 MG PO TABS: 500.0000 mg | ORAL_TABLET | Freq: Two times a day (BID) | ORAL | Status: DC

## 2014-09-30 NOTE — Discharge Instructions (Signed)
Bacterial Vaginosis Bacterial vaginosis is a vaginal infection that occurs when the normal balance of bacteria in the vagina is disrupted. It results from an overgrowth of certain bacteria. This is the most common vaginal infection in women of childbearing age. Treatment is important to prevent complications, especially in pregnant women, as it can cause a premature delivery. CAUSES  Bacterial vaginosis is caused by an increase in harmful bacteria that are normally present in smaller amounts in the vagina. Several different kinds of bacteria can cause bacterial vaginosis. However, the reason that the condition develops is not fully understood. RISK FACTORS Certain activities or behaviors can put you at an increased risk of developing bacterial vaginosis, including:  Having a new sex partner or multiple sex partners.  Douching.  Using an intrauterine device (IUD) for contraception. Women do not get bacterial vaginosis from toilet seats, bedding, swimming pools, or contact with objects around them. SIGNS AND SYMPTOMS  Some women with bacterial vaginosis have no signs or symptoms. Common symptoms include:  Grey vaginal discharge.  A fishlike odor with discharge, especially after sexual intercourse.  Itching or burning of the vagina and vulva.  Burning or pain with urination. DIAGNOSIS  Your health care provider will take a medical history and examine the vagina for signs of bacterial vaginosis. A sample of vaginal fluid may be taken. Your health care provider will look at this sample under a microscope to check for bacteria and abnormal cells. A vaginal pH test may also be done.  TREATMENT  Bacterial vaginosis may be treated with antibiotic medicines. These may be given in the form of a pill or a vaginal cream. A second round of antibiotics may be prescribed if the condition comes back after treatment.  HOME CARE INSTRUCTIONS   Only take over-the-counter or prescription medicines as  directed by your health care provider.  If antibiotic medicine was prescribed, take it as directed. Make sure you finish it even if you start to feel better.  Do not have sex until treatment is completed.  Tell all sexual partners that you have a vaginal infection. They should see their health care provider and be treated if they have problems, such as a mild rash or itching.  Practice safe sex by using condoms and only having one sex partner. SEEK MEDICAL CARE IF:   Your symptoms are not improving after 3 days of treatment.  You have increased discharge or pain.  You have a fever. MAKE SURE YOU:   Understand these instructions.  Will watch your condition.  Will get help right away if you are not doing well or get worse. FOR MORE INFORMATION  Centers for Disease Control and Prevention, Division of STD Prevention: www.cdc.gov/std American Sexual Health Association (ASHA): www.ashastd.org  Document Released: 09/19/2005 Document Revised: 07/10/2013 Document Reviewed: 05/01/2013 Exit[employer] Patient Information 2015 [employer]. This information is not intended to replace advice given to you by your health care provider. Make sure you discuss any questions you have with your health care provider.  

## 2014-09-30 NOTE — MAU Provider Note (Signed)
History     CSN: [id number]  Arrival date and time: 09/29/14 2231   First Provider Initiated Contact with Patient 09/30/14 0056      No chief complaint on file.  HPI  Rhonda [name] is a 23 y.o. G0P0000 who presents to MAU today with complaint of lower abdominal pain x 1 month and lower back pain since yesterday. She states normal LMP of 09/11/14. She denies vaginal bleeding, discharge, fever, N/V/D or constipation. She is sexually active with a female partner.   OB History    Gravida Para Term Preterm AB TAB SAB Ectopic Multiple Living   0 0 0 0 0 0 0 0 0 0       Past Medical History  Diagnosis Date  . Medical history non-contributory     Past Surgical History  Procedure Laterality Date  . Tonsillectomy      History reviewed. No pertinent family history.  History  Substance Use Topics  . Smoking status: Former Smoker    Types: Cigarettes    Quit date: 09/30/2012  . Smokeless tobacco: Not on file  . Alcohol Use: No    Allergies: No Known Allergies  Prescriptions prior to admission  Medication Sig Dispense Refill Last Dose  . ibuprofen (ADVIL,MOTRIN) 800 MG tablet Take 800 mg by mouth every 8 (eight) hours as needed.   09/29/2014 at Unknown time    Review of Systems  Constitutional: Negative for fever and malaise/fatigue.  Gastrointestinal: Positive for abdominal pain. Negative for nausea, vomiting, diarrhea and constipation.  Genitourinary: Negative for dysuria, urgency and frequency.       Neg - vaginal bleeding, discharge  Musculoskeletal: Positive for back pain.   Physical Exam   Blood pressure 114/69, pulse 65, temperature 98.8 F (37.1 C), temperature source Oral, resp. rate 16, height 5\' 5"  (1.651 m), weight 137 lb 4 oz (62.256 kg), last menstrual period 09/11/2014.  Physical Exam  Constitutional: She is oriented to person, place, and time. She appears well-developed and well-nourished. No distress.  HENT:  Head: Normocephalic.  Cardiovascular:  Normal rate.   Respiratory: Effort normal.  GI: Soft. Bowel sounds are normal. She exhibits no distension and no mass. There is tenderness (mild suprapubic tenderness to palpation). There is no rebound, no guarding and no CVA tenderness.  Genitourinary: No bleeding in the vagina. Vaginal discharge (scant thin, white discharge noted) found.  Patient refused bimanual exam  Musculoskeletal:       Lumbar back: She exhibits tenderness (mild). She exhibits normal range of motion, no bony tenderness, no swelling, no edema, no pain and no spasm.  Neurological: She is alert and oriented to person, place, and time.  Skin: Skin is warm and dry. No erythema.  Psychiatric: She has a normal mood and affect.   Results for orders placed or performed during the hospital encounter of 09/29/14 (from the past 24 hour(s))  Urinalysis, Routine w reflex microscopic     Status: Abnormal   Collection Time: 09/29/14 11:00 PM  Result Value Ref Range   Color, Urine YELLOW YELLOW   APPearance CLEAR CLEAR   Specific Gravity, Urine 1.020 1.005 - 1.030   pH 6.0 5.0 - 8.0   Glucose, UA NEGATIVE NEGATIVE mg/dL   Hgb urine dipstick SMALL (A) NEGATIVE   Bilirubin Urine NEGATIVE NEGATIVE   Ketones, ur NEGATIVE NEGATIVE mg/dL   Protein, ur NEGATIVE NEGATIVE mg/dL   Urobilinogen, UA 0.2 0.0 - 1.0 mg/dL   Nitrite NEGATIVE NEGATIVE   Leukocytes, UA NEGATIVE NEGATIVE  Urine microscopic-add on     Status: Abnormal   Collection Time: 09/29/14 11:00 PM  Result Value Ref Range   Squamous Epithelial / LPF FEW (A) RARE   WBC, UA 0-2 <3 WBC/hpf   RBC / HPF 0-2 <3 RBC/hpf   Bacteria, UA FEW (A) RARE   Urine-Other MUCOUS PRESENT   Pregnancy, urine POC     Status: None   Collection Time: 09/29/14 11:08 PM  Result Value Ref Range   Preg Test, Ur NEGATIVE NEGATIVE  Wet prep, genital     Status: Abnormal   Collection Time: 09/30/14  1:05 AM  Result Value Ref Range   Yeast Wet Prep HPF POC NONE SEEN NONE SEEN   Trich, Wet Prep  NONE SEEN NONE SEEN   Clue Cells Wet Prep HPF POC FEW (A) NONE SEEN   WBC, Wet Prep HPF POC NONE SEEN NONE SEEN    MAU Course  Procedures None  MDM UPT - negative UA, wet prep, GC/Chlamydia, HIV today  Assessment and Plan  A: Bacterial vaginosis Muscle strain of low back  P: Discharge home Rx for Flagyl given to patient Advised Ibuprofen PRN pain Discussed safe sex practices for same sex relationships GC/chlamydia pending Patient may return to MAU as needed or if her condition were to change or worsen   Marny LowensteinJulie N Wenzel, PA-C  09/30/2014, 2:10 AM

## 2014-12-30 IMAGING — CR DG CHEST 2V
2 series · 2 of 2 positions shown · non-contrast
Comparison: None.

CLINICAL DATA: Cough

CHEST - 2 VIEW

[w chest pa]
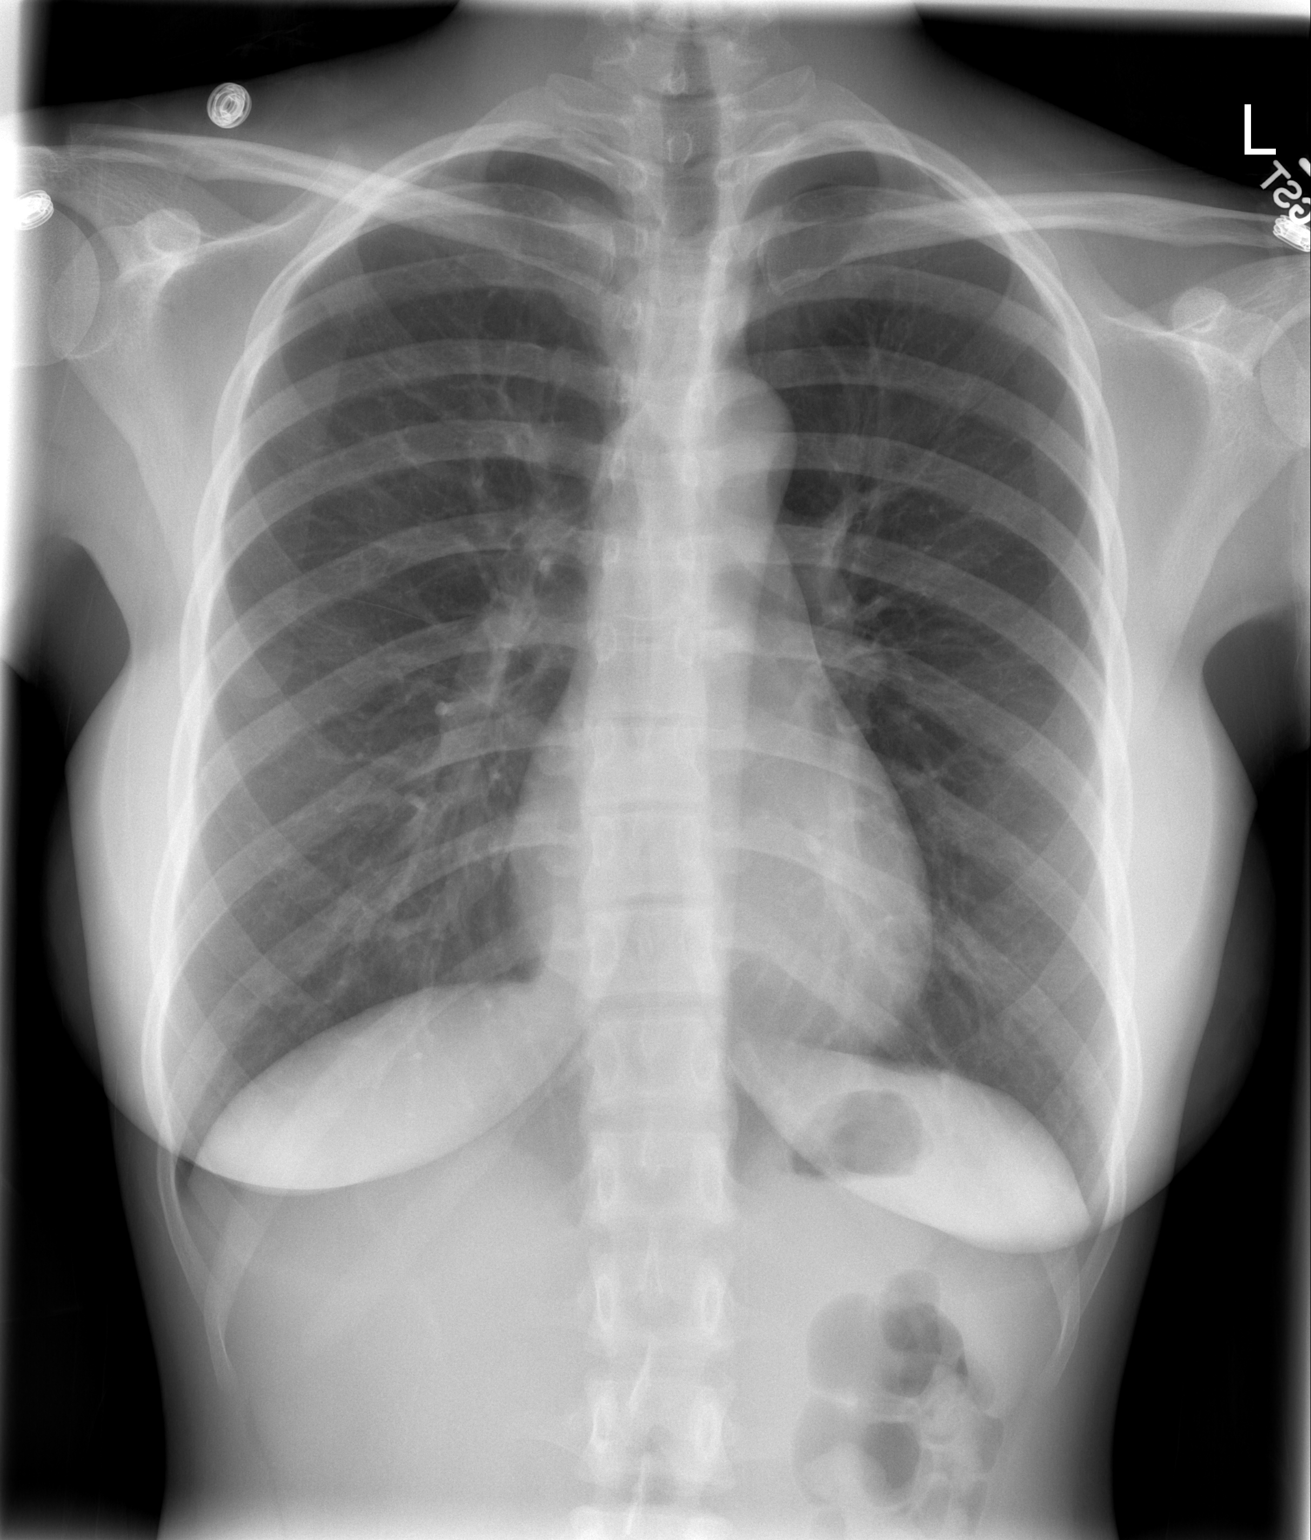

[w chest lat]
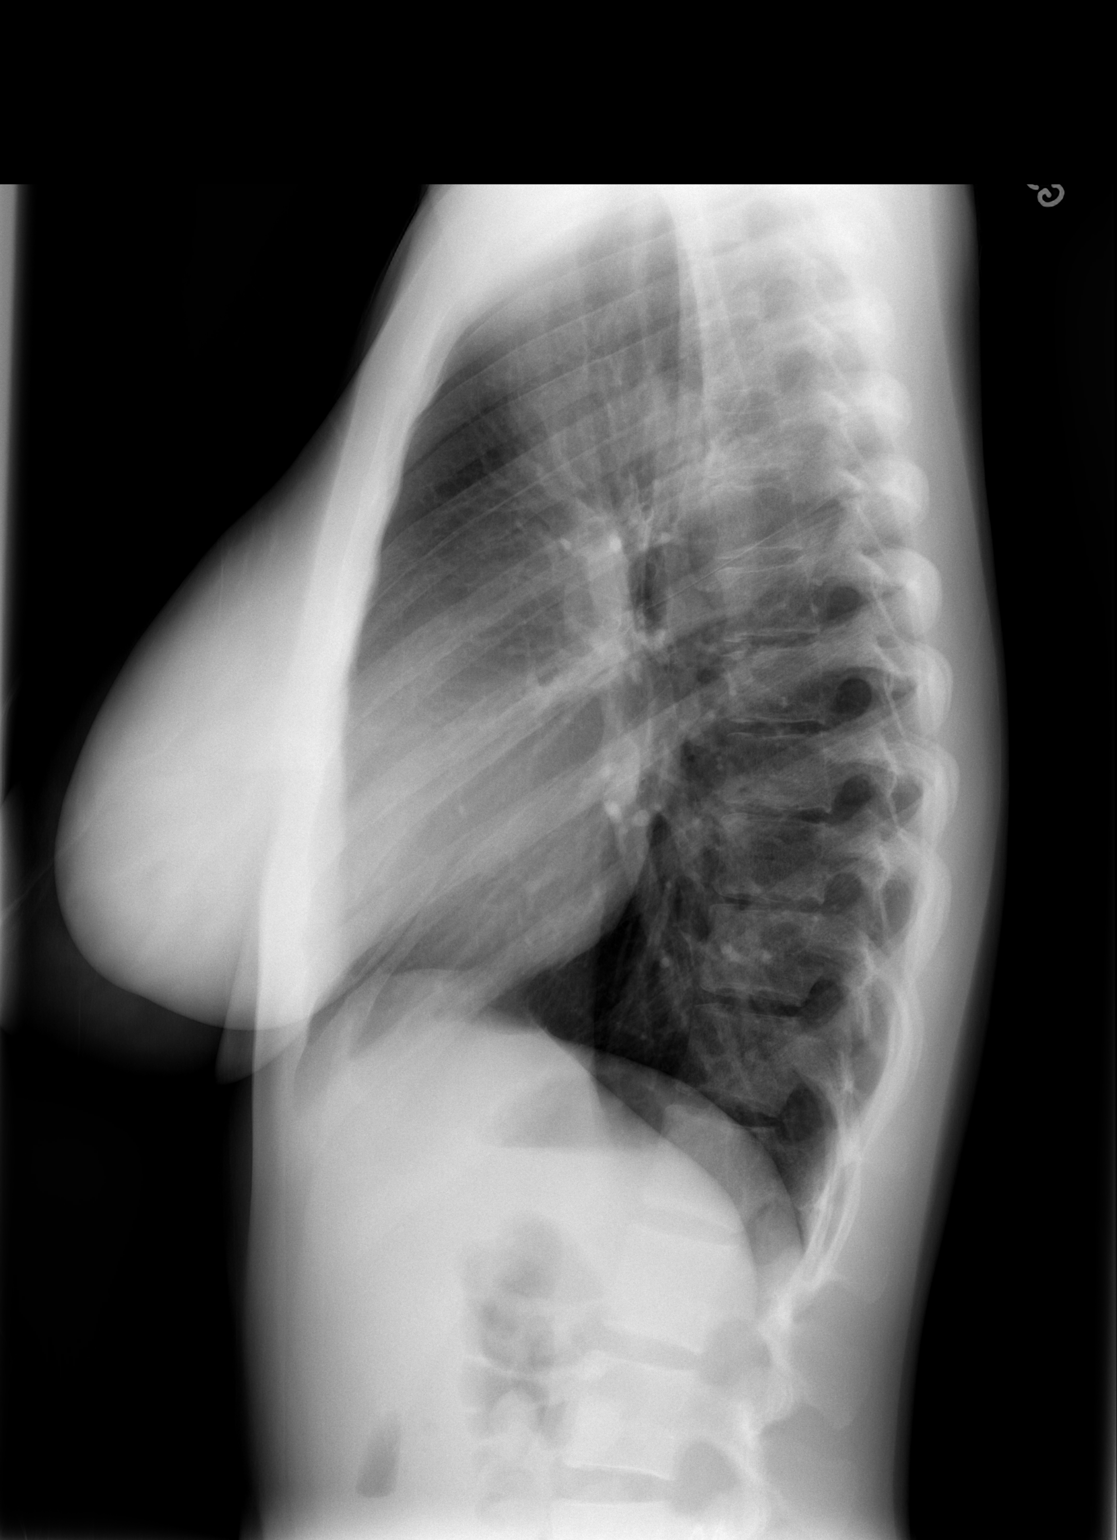

[2 of 2 positions shown; findings below may reference images not displayed]

FINDINGS: Lungs are clear. No pleural effusion or pneumothorax. The
cardiomediastinal contours are within normal limits. The visualized
bones and soft tissues are without significant appreciable
abnormality.
IMPRESSION: No radiographic evidence of acute cardiopulmonary process.

## 2015-12-12 ENCOUNTER — Emergency Department (HOSPITAL_COMMUNITY)
Admission: EM | Admit: 2015-12-12 | Discharge: 2015-12-13 | Disposition: A | Discharge status: Home / Self Care | Payer: No Typology Code available for payment source | Attending: Emergency Medicine | Admitting: Emergency Medicine

## 2015-12-12 ENCOUNTER — Encounter (HOSPITAL_COMMUNITY): Payer: Self-pay | Admitting: *Deleted

## 2015-12-12 DIAGNOSIS — R52 Pain, unspecified: Secondary | ICD-10-CM | POA: Diagnosis not present

## 2015-12-12 DIAGNOSIS — F17210 Nicotine dependence, cigarettes, uncomplicated: Secondary | ICD-10-CM | POA: Diagnosis not present

## 2015-12-12 DIAGNOSIS — R112 Nausea with vomiting, unspecified: Secondary | ICD-10-CM | POA: Insufficient documentation

## 2015-12-12 LAB — LIPASE, BLOOD: Lipase: 22 U/L (ref 11–51)

## 2015-12-12 LAB — COMPREHENSIVE METABOLIC PANEL
ALT: 60 U/L — ABNORMAL HIGH (ref 14–54)
AST: 66 U/L — ABNORMAL HIGH (ref 15–41)
Albumin: 4.6 g/dL (ref 3.5–5.0)
Alkaline Phosphatase: 52 U/L (ref 38–126)
Anion gap: 12 (ref 5–15)
BUN: 12 mg/dL (ref 6–20)
CHLORIDE: 110 mmol/L (ref 101–111)
CO2: 23 mmol/L (ref 22–32)
Calcium: 9.1 mg/dL (ref 8.9–10.3)
Creatinine, Ser: 0.79 mg/dL (ref 0.44–1.00)
Glucose, Bld: 120 mg/dL — ABNORMAL HIGH (ref 65–99)
POTASSIUM: 3.8 mmol/L (ref 3.5–5.1)
SODIUM: 145 mmol/L (ref 135–145)
Total Bilirubin: 0.7 mg/dL (ref 0.3–1.2)
Total Protein: 7.8 g/dL (ref 6.5–8.1)

## 2015-12-12 LAB — I-STAT BETA HCG BLOOD, ED (MC, WL, AP ONLY): I-stat hCG, quantitative: <5 m[IU]/mL (ref <5)

## 2015-12-12 LAB — CBC
HEMATOCRIT: 38 % (ref 36.0–46.0)
Hemoglobin: 13.5 g/dL (ref 12.0–15.0)
MCH: 31.6 pg (ref 26.0–34.0)
MCHC: 35.5 g/dL (ref 30.0–36.0)
MCV: 89 fL (ref 78.0–100.0)
Platelets: 320 10*3/uL (ref 150–400)
RBC: 4.27 MIL/uL (ref 3.87–5.11)
RDW: 12.5 % (ref 11.5–15.5)
WBC: 18.1 10*3/uL — AB (ref 4.0–10.5)

## 2015-12-12 MED ORDER — ONDANSETRON 4 MG PO TBDP
4.0000 mg | ORAL_TABLET | Freq: Once | ORAL | Status: DC | PRN
  Filled 2015-12-12: qty 1

## 2015-12-12 NOTE — ED Notes (Signed)
Pt reports n/v/d and generalized - pt states she is unsure if she has the flu or if she has alcohol poisoning as the pt admits to drink a large amount of alcohol last night. Pt denies any recent sick contacts or fever.

## 2015-12-12 NOTE — ED Notes (Signed)
Pt has been called three times,  No answer

## 2015-12-12 NOTE — ED Notes (Signed)
No answer, pt called twice

## 2016-03-03 ENCOUNTER — Emergency Department (HOSPITAL_COMMUNITY)
Admission: EM | Admit: 2016-03-03 | Discharge: 2016-03-03 | Discharge status: Against Medical Advice | Payer: No Typology Code available for payment source

## 2016-03-03 NOTE — ED Notes (Signed)
Called for two other times out in lobby, removing patient from the track board.

## 2016-03-03 NOTE — ED Notes (Signed)
Called for patient once in lobby, no response

## 2016-03-11 ENCOUNTER — Encounter (HOSPITAL_COMMUNITY): Payer: Self-pay

## 2016-03-11 ENCOUNTER — Emergency Department (HOSPITAL_COMMUNITY)
Admission: EM | Admit: 2016-03-11 | Discharge: 2016-03-11 | Discharge status: Against Medical Advice | Payer: No Typology Code available for payment source | Attending: Emergency Medicine | Admitting: Emergency Medicine

## 2016-03-11 DIAGNOSIS — M549 Dorsalgia, unspecified: Secondary | ICD-10-CM | POA: Diagnosis not present

## 2016-03-11 DIAGNOSIS — Y999 Unspecified external cause status: Secondary | ICD-10-CM | POA: Insufficient documentation

## 2016-03-11 DIAGNOSIS — M542 Cervicalgia: Secondary | ICD-10-CM | POA: Diagnosis present

## 2016-03-11 DIAGNOSIS — Y939 Activity, unspecified: Secondary | ICD-10-CM | POA: Insufficient documentation

## 2016-03-11 DIAGNOSIS — F17210 Nicotine dependence, cigarettes, uncomplicated: Secondary | ICD-10-CM | POA: Diagnosis not present

## 2016-03-11 DIAGNOSIS — Y92410 Unspecified street and highway as the place of occurrence of the external cause: Secondary | ICD-10-CM | POA: Insufficient documentation

## 2016-03-11 MED ORDER — CYCLOBENZAPRINE HCL 10 MG PO TABS
5.0000 mg | ORAL_TABLET | Freq: Once | ORAL | Status: DC
Start: 2016-03-11 — End: 2016-03-11
  Filled 2016-03-11: qty 1
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
## 2017-02-09 ENCOUNTER — Inpatient Hospital Stay (HOSPITAL_COMMUNITY)
Admission: RE | Admit: 2017-02-09 | Discharge: 2017-02-09 | Disposition: A | Discharge status: Home / Self Care | Payer: No Typology Code available for payment source | Source: Ambulatory Visit | Attending: Obstetrics and Gynecology | Admitting: Obstetrics and Gynecology

## 2017-02-09 DIAGNOSIS — N3001 Acute cystitis with hematuria: Secondary | ICD-10-CM | POA: Insufficient documentation

## 2017-02-09 DIAGNOSIS — R300 Dysuria: Secondary | ICD-10-CM | POA: Insufficient documentation

## 2017-02-09 DIAGNOSIS — Z3202 Encounter for pregnancy test, result negative: Secondary | ICD-10-CM | POA: Insufficient documentation

## 2017-02-09 DIAGNOSIS — F17210 Nicotine dependence, cigarettes, uncomplicated: Secondary | ICD-10-CM | POA: Insufficient documentation

## 2017-02-09 LAB — RAPID URINE DRUG SCREEN, HOSP PERFORMED
Amphetamines: NOT DETECTED
BARBITURATES: NOT DETECTED
Benzodiazepines: NOT DETECTED
COCAINE: NOT DETECTED
OPIATES: NOT DETECTED
TETRAHYDROCANNABINOL: POSITIVE — AB

## 2017-02-09 LAB — URINALYSIS, ROUTINE W REFLEX MICROSCOPIC
Bilirubin Urine: NEGATIVE
GLUCOSE, UA: NEGATIVE mg/dL
KETONES UR: NEGATIVE mg/dL
NITRITE: NEGATIVE
PROTEIN: 30 mg/dL — AB
Specific Gravity, Urine: 1.015 (ref 1.005–1.030)
pH: 5 (ref 5.0–8.0)

## 2017-02-09 LAB — POCT PREGNANCY, URINE: Preg Test, Ur: NEGATIVE

## 2017-02-09 MED ORDER — SULFAMETHOXAZOLE-TRIMETHOPRIM 800-160 MG PO TABS: 1.0000 | ORAL_TABLET | Freq: Two times a day (BID) | ORAL | 0 refills | Status: DC

## 2017-02-09 NOTE — MAU Provider Note (Signed)
History     CSN: [id number]  Arrival date and time: 02/09/17 1611  First Provider Initiated Contact with Patient 02/09/17 1907      Chief Complaint  Patient presents with  . Urinary Tract Infection   Rhonda [name] is a 26 y.o. Female who presents for dysuria. Symptoms began yesterday. States feels just like when she previously had a UTI. Reports dysuria & urinary frequency. Denies any other symptoms.    Urinary Tract Infection   This is a new problem. The current episode started yesterday. The problem occurs every urination. The problem has been gradually worsening. The quality of the pain is described as burning. There has been no fever. She is sexually active. There is no history of pyelonephritis. Associated symptoms include frequency. Pertinent negatives include no chills, discharge, flank pain, hematuria, nausea, possible pregnancy or vomiting. She has tried nothing for the symptoms. There is no history of catheterization, recurrent UTIs or a single kidney.   Past Medical History:  Diagnosis Date  . Medical history non-contributory     Past Surgical History:  Procedure Laterality Date  . TONSILLECTOMY      No family history on file.  Social History  Substance Use Topics  . Smoking status: Current Some Day Smoker    Types: Cigarettes    Last attempt to quit: 09/30/2012  . Smokeless tobacco: Not on file  . Alcohol use Yes    Allergies: No Known Allergies  Prescriptions Prior to Admission  Medication Sig Dispense Refill Last Dose  . ibuprofen (ADVIL,MOTRIN) 800 MG tablet Take 800 mg by mouth every 8 (eight) hours as needed.   09/29/2014 at Unknown time  . metroNIDAZOLE (FLAGYL) 500 MG tablet Take 1 tablet (500 mg total) by mouth 2 (two) times daily. 14 tablet 0     Review of Systems  Constitutional: Negative.  Negative for chills.  Gastrointestinal: Negative.  Negative for nausea and vomiting.  Genitourinary: Positive for dysuria and frequency. Negative for  difficulty urinating, dyspareunia, flank pain, hematuria, vaginal bleeding and vaginal discharge.  Musculoskeletal: Negative for back pain.   Physical Exam   Blood pressure 126/75, pulse 70, temperature 98.9 F (37.2 C), resp. rate 18, height 5' 5.5" (1.664 m), weight 132 lb 1.9 oz (59.9 kg).  Physical Exam  Nursing note and vitals reviewed. Constitutional: She is oriented to person, place, and time. She appears well-developed and well-nourished. No distress.  HENT:  Head: Normocephalic and atraumatic.  Eyes: Conjunctivae are normal. Right eye exhibits no discharge. Left eye exhibits no discharge. No scleral icterus.  Neck: Normal range of motion.  Respiratory: Effort normal. No respiratory distress.  GI: There is no CVA tenderness.  Neurological: She is alert and oriented to person, place, and time.  Skin: Skin is warm and dry. She is not diaphoretic.  Psychiatric: She has a normal mood and affect. Her behavior is normal. Judgment and thought content normal.   MAU Course  Procedures Results for orders placed or performed during the hospital encounter of 02/09/17 (from the past 24 hour(s))  Urine rapid drug screen (hosp performed)     Status: Abnormal   Collection Time: 02/09/17  5:04 PM  Result Value Ref Range   Opiates NONE DETECTED NONE DETECTED   Cocaine NONE DETECTED NONE DETECTED   Benzodiazepines NONE DETECTED NONE DETECTED   Amphetamines NONE DETECTED NONE DETECTED   Tetrahydrocannabinol POSITIVE (A) NONE DETECTED   Barbiturates NONE DETECTED NONE DETECTED  Urinalysis, Routine w reflex microscopic     Status:  Abnormal   Collection Time: 02/09/17  5:04 PM  Result Value Ref Range   Color, Urine YELLOW YELLOW   APPearance CLOUDY (A) CLEAR   Specific Gravity, Urine 1.015 1.005 - 1.030   pH 5.0 5.0 - 8.0   Glucose, UA NEGATIVE NEGATIVE mg/dL   Hgb urine dipstick SMALL (A) NEGATIVE   Bilirubin Urine NEGATIVE NEGATIVE   Ketones, ur NEGATIVE NEGATIVE mg/dL   Protein, ur 30  (A) NEGATIVE mg/dL   Nitrite NEGATIVE NEGATIVE   Leukocytes, UA LARGE (A) NEGATIVE   RBC / HPF 6-30 0 - 5 RBC/hpf   WBC, UA TOO NUMEROUS TO COUNT 0 - 5 WBC/hpf   Bacteria, UA FEW (A) NONE SEEN   Squamous Epithelial / LPF 0-5 (A) NONE SEEN   WBC Clumps PRESENT    Mucous PRESENT   Pregnancy, urine POC     Status: None   Collection Time: 02/09/17  5:46 PM  Result Value Ref Range   Preg Test, Ur NEGATIVE NEGATIVE    MDM UPT negative VSS, NAD, no CVAT Assessment and Plan  A; 1. Acute cystitis with hematuria   2. Dysuria   3. Pregnancy examination or test, negative result    P: Discharge home Rx bactrim If symptoms worsen or fever develops go to urgent care or ED Increase water intake  Judeth HornErin Tiwanda Threats 02/09/2017, 7:07 PM

## 2017-02-09 NOTE — MAU Note (Signed)
Pt reprots she has been having burning and irritation with urination since yesterday. No fever or back pain.

## 2017-02-09 NOTE — Discharge Instructions (Signed)

## 2017-07-06 ENCOUNTER — Encounter (HOSPITAL_COMMUNITY): Payer: Self-pay | Admitting: Emergency Medicine

## 2017-07-06 ENCOUNTER — Emergency Department (HOSPITAL_COMMUNITY)
Admission: EM | Admit: 2017-07-06 | Discharge: 2017-07-06 | Disposition: A | Discharge status: Home / Self Care | Payer: No Typology Code available for payment source | Attending: Emergency Medicine | Admitting: Emergency Medicine

## 2017-07-06 ENCOUNTER — Emergency Department (HOSPITAL_COMMUNITY): Payer: No Typology Code available for payment source

## 2017-07-06 DIAGNOSIS — Z79899 Other long term (current) drug therapy: Secondary | ICD-10-CM | POA: Diagnosis not present

## 2017-07-06 DIAGNOSIS — R05 Cough: Secondary | ICD-10-CM | POA: Diagnosis present

## 2017-07-06 DIAGNOSIS — J40 Bronchitis, not specified as acute or chronic: Secondary | ICD-10-CM

## 2017-07-06 DIAGNOSIS — B9789 Other viral agents as the cause of diseases classified elsewhere: Secondary | ICD-10-CM | POA: Insufficient documentation

## 2017-07-06 DIAGNOSIS — F17210 Nicotine dependence, cigarettes, uncomplicated: Secondary | ICD-10-CM | POA: Diagnosis not present

## 2017-07-06 DIAGNOSIS — J069 Acute upper respiratory infection, unspecified: Secondary | ICD-10-CM | POA: Diagnosis not present

## 2017-07-06 MED ORDER — IPRATROPIUM-ALBUTEROL 0.5-2.5 (3) MG/3ML IN SOLN
3.0000 mL | Freq: Four times a day (QID) | RESPIRATORY_TRACT | Status: DC
  Administered 2017-07-06: 3 mL via RESPIRATORY_TRACT
  Filled 2017-07-06: qty 3

## 2017-07-06 MED ORDER — ALBUTEROL SULFATE HFA 108 (90 BASE) MCG/ACT IN AERS
2.0000 | INHALATION_SPRAY | Freq: Once | RESPIRATORY_TRACT | Status: AC
  Administered 2017-07-06: 2 via RESPIRATORY_TRACT
  Filled 2017-07-06: qty 6.7

## 2017-07-06 MED ORDER — BENZONATATE 100 MG PO CAPS: 100.0000 mg | ORAL_CAPSULE | Freq: Three times a day (TID) | ORAL | 0 refills | Status: DC

## 2017-07-06 MED ORDER — PREDNISONE 50 MG PO TABS: 50.0000 mg | ORAL_TABLET | Freq: Every day | ORAL | 0 refills | Status: DC

## 2017-07-06 NOTE — Discharge Instructions (Signed)
Continue to take ibuprofen or Tylenol for headache. Take inhaler 2 puffs every 4 hours as needed for cough and shortness of breath. Take prednisone as prescribed until all gone for inflammation. Take Tessalon as needed for cough. Follow-up with a family doctor or return if worsening symptoms.

## 2017-07-06 NOTE — ED Provider Notes (Signed)
[facility] Provider Note   CSN: [id number] Arrival date & time: 07/06/17  0729     History   Chief Complaint Chief Complaint  Patient presents with  . Cough    HPI Rhonda [name] is a 26 y.o. female.  HPI Rhonda [name] is a 26 y.o. female presents to emergency department complaining of a cough. Patient states her symptoms began about a week and a half ago with flulike symptoms. She reports nasal congestion, sore throat, cough at that time. She states all of her symptoms improved except for the cough which is getting worse. She has been taking over-the-counter cold and flu medications and a cough syrup. She states she went through an entire bottle of the cough syrup and her cough has not gotten any better. She reports associated chest tightness and shortness of breath. She denies history of lung problems or asthma, but states that she has had bronchitis in the past. She denies any fever or chills. She's currently taking cough drops for cough. She states she is unable to sleep due to cough. Denies any other complaints.  Past Medical History:  Diagnosis Date  . Medical history non-contributory     There are no active problems to display for this patient.   Past Surgical History:  Procedure Laterality Date  . TONSILLECTOMY      OB History    Gravida Para Term Preterm AB Living   0 0 0 0 0 0   SAB TAB Ectopic Multiple Live Births   0 0 0 0         Home Medications    Prior to Admission medications   Medication Sig Start Date End Date Taking? Authorizing Provider  sulfamethoxazole-trimethoprim (BACTRIM DS,SEPTRA DS) 800-160 MG tablet Take 1 tablet by mouth 2 (two) times daily. 02/09/17   Judeth Horn, NP    Family History History reviewed. No pertinent family history.  Social History Social History  Substance Use Topics  . Smoking status: Current Some Day Smoker    Types: Cigarettes    Last attempt to quit: 09/30/2012  . Smokeless tobacco: Not on file  .  Alcohol use Yes     Allergies   Patient has no known allergies.   Review of Systems Review of Systems  Constitutional: Negative for chills and fever.  HENT: Negative for congestion and sore throat.   Respiratory: Positive for cough, chest tightness and shortness of breath.   Cardiovascular: Negative for chest pain, palpitations and leg swelling.  Gastrointestinal: Negative for nausea and vomiting.  Musculoskeletal: Negative for arthralgias, myalgias, neck pain and neck stiffness.  Skin: Negative for rash.  Neurological: Negative for dizziness, weakness and headaches.  All other systems reviewed and are negative.    Physical Exam Updated Vital Signs BP 111/83 (BP Location: Right Arm)   Pulse 87   Temp 97.9 F (36.6 C) (Oral)   Resp 16   Ht  (1.651 m)   Wt 64 kg (141 lb)   LMP 06/15/2017 (Approximate)   SpO2 99%   BMI 23.46 kg/m   Physical Exam  Constitutional: She appears well-developed and well-nourished. No distress.  HENT:  Head: Normocephalic.  Eyes: Conjunctivae are normal.  Neck: Neck supple.  Cardiovascular: Normal rate, regular rhythm and normal heart sounds.   Pulmonary/Chest: Effort normal. No respiratory distress. She has wheezes. She has no rales.  Expiratory wheezes bilaterally, worse on the right  Abdominal: Soft. Bowel sounds are normal. She exhibits no distension. There is no tenderness. There  is no rebound.  Musculoskeletal: She exhibits no edema.  Neurological: She is alert.  Skin: Skin is warm and dry.  Psychiatric: She has a normal mood and affect. Her behavior is normal.  Nursing note and vitals reviewed.    ED Treatments / Results  Labs (all labs ordered are listed, but only abnormal results are displayed) Labs Reviewed - No data to display  EKG  EKG Interpretation None       Radiology Dg Chest 2 View  Result Date: 07/06/2017 CLINICAL DATA:  Cough, congestion, difficulty breathing EXAM: CHEST  2 VIEW COMPARISON:   10/30/2012 FINDINGS: Lungs are clear.  No pleural effusion or pneumothorax. The heart is normal in size. Visualized osseous structures are within normal limits. IMPRESSION: Normal chest radiographs. Electronically Signed   By: Charline Bills M.D.   On: 07/06/2017 08:26    Procedures Procedures (including critical care time)  Medications Ordered in ED Medications  ipratropium-albuterol (DUONEB) 0.5-2.5 (3) MG/3ML nebulizer solution 3 mL (not administered)     Initial Impression / Assessment and Plan / ED Course  I have reviewed the triage vital signs and the nursing notes.  Pertinent labs & imaging results that were available during my care of the patient were reviewed by me and considered in my medical decision making (see chart for details).     Patient in emergency department with worsening cough. Chest x-ray obtained and is negative. Patient is wheezing, coughing almost nonstop. Breathing treatment given, states helped a little. Wheezing did improve. Will discharge home with an inhaler, prednisone, Tessalon. Advised to continue to increase fluids. Follow with family doctor as needed. Return precautions discussed.   Vitals:   07/06/17 0734  BP: 111/83  Pulse: 87  Resp: 16  Temp: 97.9 F (36.6 C)  TempSrc: Oral  SpO2: 99%  Weight: 64 kg (141 lb)  Height:  (1.651 m)     Final Clinical Impressions(s) / ED Diagnoses   Final diagnoses:  Viral URI with cough  Bronchitis    New Prescriptions New Prescriptions   BENZONATATE (TESSALON) 100 MG CAPSULE    Take 1 capsule (100 mg total) by mouth every 8 (eight) hours.   PREDNISONE (DELTASONE) 50 MG TABLET    Take 1 tablet (50 mg total) by mouth daily.     Jaynie Crumble, PA-C 07/06/17 1610    Rolan Bucco, MD 07/06/17 [phone]

## 2017-07-06 NOTE — ED Triage Notes (Signed)
Pt c/o worsening cough x 1 week. Pt states productive at times, difficulty breathing, pt states difficulty sleeping. Has attempted OTC cough meds and increasing fluids, no relief.

## 2018-09-12 ENCOUNTER — Encounter (HOSPITAL_COMMUNITY): Payer: Self-pay | Admitting: Emergency Medicine

## 2018-09-12 ENCOUNTER — Ambulatory Visit (HOSPITAL_COMMUNITY)
Admission: EM | Admit: 2018-09-12 | Discharge: 2018-09-12 | Disposition: A | Discharge status: Home / Self Care | Payer: No Typology Code available for payment source | Attending: Family Medicine | Admitting: Family Medicine

## 2018-09-12 DIAGNOSIS — N939 Abnormal uterine and vaginal bleeding, unspecified: Secondary | ICD-10-CM

## 2018-09-12 LAB — POCT PREGNANCY, URINE: PREG TEST UR: NEGATIVE

## 2018-09-12 LAB — POCT URINALYSIS DIP (DEVICE)
BILIRUBIN URINE: NEGATIVE
Glucose, UA: NEGATIVE mg/dL
Hgb urine dipstick: NEGATIVE
Ketones, ur: NEGATIVE mg/dL
Leukocytes, UA: NEGATIVE
Nitrite: NEGATIVE
Protein, ur: NEGATIVE mg/dL
SPECIFIC GRAVITY, URINE: 1.015 (ref 1.005–1.030)
Urobilinogen, UA: 0.2 mg/dL (ref 0.0–1.0)
pH: 7.5 (ref 5.0–8.0)

## 2018-09-12 NOTE — Discharge Instructions (Addendum)
Pregnancy test is negative. Urine without infection. Please follow up with GYN for further evaluation of abnormal bleeding. Monitor for weakness, dizziness, passing out, go to the emergency department for further evaluation needed.

## 2018-09-12 NOTE — ED Provider Notes (Signed)
[facility]    CSN: [id number] Arrival date & time: 09/12/18  1332     History   Chief Complaint Chief Complaint  Patient presents with  . Irregular Menstrual Cycle    HPI Rhonda [name] is a 27 y.o. female.   27 year old female comes in for 3 month history of abnormal uterine bleeding. States her normal cycle is 4-7 days every 4-5 weeks. Usually need to change her tampon twice a day. These past 3 months, she still has her regular cycles with normal flow, but after 2 weeks of cycle, has some spotting that requires need to wear a panty liner. Spotting can last anywhere from 2-7 days. She denies abdominal pain, nausea, vomiting. Denies fever, chills, night sweats. Denies urinary symptoms such as frequency, dysuria. Hematuria. Denies vaginal discharge, itching, irritation. Denies weakness, dizziness, lightheadedness. Sexually active with 1 female partner, no condom use, no birth control use.      Past Medical History:  Diagnosis Date  . Medical history non-contributory     There are no active problems to display for this patient.   Past Surgical History:  Procedure Laterality Date  . TONSILLECTOMY      OB History    Gravida  0   Para  0   Term  0   Preterm  0   AB  0   Living  0     SAB  0   TAB  0   Ectopic  0   Multiple  0   Live Births               Home Medications    Prior to Admission medications   Medication Sig Start Date End Date Taking? Authorizing Provider  benzonatate (TESSALON) 100 MG capsule Take 1 capsule (100 mg total) by mouth every 8 (eight) hours. 07/06/17   Kirichenko, Tatyana, PA-C  ibuprofen (ADVIL,MOTRIN) 200 MG tablet Take 800 mg by mouth every 6 (six) hours as needed for headache or moderate pain.    [provider]  Phenylephrine-DM-GG (MUCINEX FAST-MAX CONGEST COUGH) 2.5-5-100 MG/5ML LIQD Take 20 mLs by mouth every 6 (six) hours as needed (cough, cold).    [provider]  predniSONE (DELTASONE)  50 MG tablet Take 1 tablet (50 mg total) by mouth daily. 07/06/17   Kirichenko, Tatyana, PA-C  sulfamethoxazole-trimethoprim (BACTRIM DS,SEPTRA DS) 800-160 MG tablet Take 1 tablet by mouth 2 (two) times daily. Patient not taking: Reported on 07/06/2017 02/09/17   Judeth Horn, NP    Family History History reviewed. No pertinent family history.  Social History Social History   Tobacco Use  . Smoking status: Current Some Day Smoker    Types: Cigarettes    Last attempt to quit: 09/30/2012    Years since quitting: 5.9  . Smokeless tobacco: Never Used  Substance Use Topics  . Alcohol use: Yes  . Drug use: No     Allergies   Patient has no known allergies.   Review of Systems Review of Systems  Reason unable to perform ROS: See HPI as above.     Physical Exam Triage Vital Signs ED Triage Vitals  Enc Vitals Group     BP 09/12/18 1456 124/87     Pulse Rate 09/12/18 1456 71     Resp 09/12/18 1456 16     Temp 09/12/18 1456 98.1 F (36.7 C)     Temp Source 09/12/18 1456 Oral     SpO2 09/12/18 1456 100 %  Weight --      Height --      Head Circumference --      Peak Flow --      Pain Score 09/12/18 1459 0     Pain Loc --      Pain Edu? --      Excl. in GC? --    No data found.  Updated Vital Signs BP 124/87 (BP Location: Right Arm)   Pulse 71   Temp 98.1 F (36.7 C) (Oral)   Resp 16   SpO2 100%   Physical Exam  Constitutional: She is oriented to person, place, and time. She appears well-developed and well-nourished. No distress.  HENT:  Head: Normocephalic and atraumatic.  Eyes: Pupils are equal, round, and reactive to light. Conjunctivae are normal.  Cardiovascular: Normal rate, regular rhythm and normal heart sounds. Exam reveals no gallop and no friction rub.  No murmur heard. Pulmonary/Chest: Effort normal and breath sounds normal. She has no wheezes. She has no rales.  Abdominal: Soft. Bowel sounds are normal. She exhibits no mass. There is no  tenderness. There is no rebound, no guarding and no CVA tenderness.  Neurological: She is alert and oriented to person, place, and time.  Skin: Skin is warm and dry.  Psychiatric: She has a normal mood and affect. Her behavior is normal. Judgment normal.     UC Treatments / Results  Labs (all labs ordered are listed, but only abnormal results are displayed) Labs Reviewed  POC URINE PREG, ED  POCT URINALYSIS DIP (DEVICE)  POCT PREGNANCY, URINE    EKG None  Radiology No results found.  Procedures Procedures (including critical care time)  Medications Ordered in UC Medications - No data to display  Initial Impression / Assessment and Plan / UC Course  I have reviewed the triage vital signs and the nursing notes.  Pertinent labs & imaging results that were available during my care of the patient were reviewed by me and considered in my medical decision making (see chart for details).    Pregnancy test negative. Offered OCPs/megace to control bleeding. Patient deferred for now. Stable, without tachycardia, weakness, dizziness. Will have patient follow up with GYN for further evaluation of AUB.  Final Clinical Impressions(s) / UC Diagnoses   Final diagnoses:  Abnormal uterine bleeding (AUB)    ED Prescriptions    None        Belinda FisherYu, Darthula Desa V, PA-C 09/12/18 1542

## 2018-09-12 NOTE — ED Triage Notes (Signed)
Pt states she has been having some irregular periods, where she has bled every few weeks for 4-7 days for the last three months.  States second cycle each month is lighter.  Patient states she is not having any increase in menstrual symptoms or pain.

## 2018-10-10 ENCOUNTER — Inpatient Hospital Stay (HOSPITAL_COMMUNITY)
Admission: AD | Admit: 2018-10-10 | Discharge: 2018-10-10 | Disposition: A | Discharge status: Home / Self Care | Payer: No Typology Code available for payment source | Source: Ambulatory Visit | Attending: Obstetrics and Gynecology | Admitting: Obstetrics and Gynecology

## 2018-10-10 ENCOUNTER — Encounter (HOSPITAL_COMMUNITY): Payer: Self-pay | Admitting: Emergency Medicine

## 2018-10-10 DIAGNOSIS — Z3202 Encounter for pregnancy test, result negative: Secondary | ICD-10-CM | POA: Insufficient documentation

## 2018-10-10 DIAGNOSIS — N939 Abnormal uterine and vaginal bleeding, unspecified: Secondary | ICD-10-CM | POA: Diagnosis not present

## 2018-10-10 DIAGNOSIS — F17210 Nicotine dependence, cigarettes, uncomplicated: Secondary | ICD-10-CM | POA: Insufficient documentation

## 2018-10-10 LAB — WET PREP, GENITAL
Sperm: NONE SEEN
Trich, Wet Prep: NONE SEEN
Yeast Wet Prep HPF POC: NONE SEEN

## 2018-10-10 LAB — CBC
HCT: 40.1 % (ref 36.0–46.0)
Hemoglobin: 13.8 g/dL (ref 12.0–15.0)
MCH: 32 pg (ref 26.0–34.0)
MCHC: 34.4 g/dL (ref 30.0–36.0)
MCV: 93 fL (ref 80.0–100.0)
NRBC: 0 % (ref 0.0–0.2)
PLATELETS: 294 10*3/uL (ref 150–400)
RBC: 4.31 MIL/uL (ref 3.87–5.11)
RDW: 12.3 % (ref 11.5–15.5)
WBC: 8.9 10*3/uL (ref 4.0–10.5)

## 2018-10-10 LAB — POCT PREGNANCY, URINE: PREG TEST UR: NEGATIVE

## 2018-10-10 NOTE — MAU Note (Addendum)
Pt here for vaginal bleeding between periods. States for the last couple of months she has been getting her period every 2 weeks. States she has normal regular periods that have gotten longer-now lasting 6-7 days. States she has some spotting between periods but that has now gotten somewhat heavier. States she notices the spotting the day of or after she has intercourse.  Pt denies pain. Pt denies itching, irritation, odor, or pain.

## 2018-10-10 NOTE — MAU Provider Note (Addendum)
Chief Complaint:  No chief complaint on file.   First Provider Initiated Contact with Patient 10/10/18 2202      HPI: Rhonda [name] is a 28 y.o. [id number] who presents to maternity admissions reporting abnormal periods.  Since August has monthly cycles with periods of bleeding between, usually initiated after intercourse.  Bleeds 4 days with each episode, not heavily.  Some sharp pain with intercourse.  . She reports vaginal bleeding, no vaginal itching/burning, urinary symptoms, h/a, dizziness, n/v, or fever/chills.   At last part of visit, patient asked if Morning after pill could cause irregular bleeding   Used it in summer prior to first irregular bleeding episode.    [place] moved from [place].  Has no GYN here  Vaginal Bleeding  The patient's primary symptoms include pelvic pain (rare, intermittent) and vaginal bleeding. The patient's pertinent negatives include no genital itching, genital lesions or genital odor. This is a recurrent problem. The current episode started more than 1 month ago. The problem occurs intermittently. The pain is mild. She is not pregnant. Pertinent negatives include no chills, constipation, diarrhea, fever, frequency or headaches. The vaginal discharge was bloody. The vaginal bleeding is lighter than menses. She has not been passing clots. She has not been passing tissue. Nothing aggravates the symptoms. She has tried nothing for the symptoms.    Past Medical History: Past Medical History:  Diagnosis Date  . Medical history non-contributory     Past obstetric history: OB History  Gravida Para Term Preterm AB Living  0 0 0 0 0 0  SAB TAB Ectopic Multiple Live Births  0 0 0 0      Past Surgical History: Past Surgical History:  Procedure Laterality Date  . TONSILLECTOMY      Family History: History reviewed. No pertinent family history.  Social History: Social History   Tobacco Use  . Smoking status: Current Some Day Smoker    Types: Cigarettes   Last attempt to quit: 09/30/2012    Years since quitting: 6.0  . Smokeless tobacco: Never Used  Substance Use Topics  . Alcohol use: Yes  . Drug use: No    Allergies: No Known Allergies  Meds:  Medications Prior to Admission  Medication Sig Dispense Refill Last Dose  . benzonatate (TESSALON) 100 MG capsule Take 1 capsule (100 mg total) by mouth every 8 (eight) hours. 21 capsule 0   . ibuprofen (ADVIL,MOTRIN) 200 MG tablet Take 800 mg by mouth every 6 (six) hours as needed for headache or moderate pain.   Past Week at Unknown time  . Phenylephrine-DM-GG (MUCINEX FAST-MAX CONGEST COUGH) 2.5-5-100 MG/5ML LIQD Take 20 mLs by mouth every 6 (six) hours as needed (cough, cold).   Past Week at Unknown time  . predniSONE (DELTASONE) 50 MG tablet Take 1 tablet (50 mg total) by mouth daily. 5 tablet 0   . sulfamethoxazole-trimethoprim (BACTRIM DS,SEPTRA DS) 800-160 MG tablet Take 1 tablet by mouth 2 (two) times daily. (Patient not taking: Reported on 07/06/2017) 10 tablet 0 Completed Course at Unknown time    I have reviewed patient's Past Medical Hx, Surgical Hx, Family Hx, Social Hx, medications and allergies.  ROS:  Review of Systems  Constitutional: Negative for chills and fever.  Gastrointestinal: Negative for constipation and diarrhea.  Genitourinary: Positive for pelvic pain (rare, intermittent) and vaginal bleeding. Negative for frequency.  Neurological: Negative for headaches.   Other systems negative     Physical Exam   Patient Vitals for the past 24 hrs:  BP Temp Temp src Pulse Resp SpO2  10/10/18 2135 117/78 97.8 F (36.6 C) Oral 82 16 98 %   Constitutional: Well-developed, well-nourished female in no acute distress.  Cardiovascular: normal rate and rhythm, no ectopy audible, S1 & S2 heard, no murmur Respiratory: normal effort, no distress. Lungs CTAB with no wheezes or crackles GI: Abd soft, non-tender.  Nondistended.  No rebound, No guarding.  Bowel Sounds audible  MS:  Extremities nontender, no edema, normal ROM Neurologic: Alert and oriented x 4.   Grossly nonfocal. GU: Neg CVAT. Skin:  Warm and Dry Psych:  Affect appropriate.  PELVIC EXAM: Cervix pink, visually closed, without lesion, moderate blood in vault,  vaginal walls and external genitalia normal Bimanual exam: Cervix firm, anterior, neg CMT, uterus nontender, nonenlarged, adnexa without tenderness, enlargement, or mass      Labs: Results for orders placed or performed during the hospital encounter of 10/10/18 (from the past 24 hour(s))  Pregnancy, urine POC     Status: None   Collection Time: 10/10/18  9:52 PM  Result Value Ref Range   Preg Test, Ur NEGATIVE NEGATIVE  Wet prep, genital     Status: Abnormal   Collection Time: 10/10/18 10:24 PM  Result Value Ref Range   Yeast Wet Prep HPF POC NONE SEEN NONE SEEN   Trich, Wet Prep NONE SEEN NONE SEEN   Clue Cells Wet Prep HPF POC PRESENT (A) NONE SEEN   WBC, Wet Prep HPF POC FEW (A) NONE SEEN   Sperm NONE SEEN   CBC     Status: None   Collection Time: 10/10/18 10:33 PM  Result Value Ref Range   WBC 8.9 4.0 - 10.5 K/uL   RBC 4.31 3.87 - 5.11 MIL/uL   Hemoglobin 13.8 12.0 - 15.0 g/dL   HCT 13.0 86.5 - 78.4 %   MCV 93.0 80.0 - 100.0 fL   MCH 32.0 26.0 - 34.0 pg   MCHC 34.4 30.0 - 36.0 g/dL   RDW 69.6 29.5 - 28.4 %   Platelets 294 150 - 400 K/uL   nRBC 0.0 0.0 - 0.2 %    Imaging:  No results found.  MAU Course/MDM: I have ordered labs as follows: CUlture, CBC.  Wet prep has clue cells but no whiff. WIll not treat.  Normal hemoglobin.   Imaging ordered: none Results reviewed.  Discussed trial of Provera challenge to try to clear out lining of uterus. If fails, may need more evaluation.  Treatments in MAU included none.   Pt stable at time of discharge.  Assessment: Abnormal Uterine Bleeding Possible endometrial polyp  Plan: Discharge home Recommend Start contraception if does not want pregnancy Rx sent for Provera  for 10  day challenge Follow up in clinic if persists  Encouraged to return here or to other Urgent Care/ED if she develops worsening of symptoms, increase in pain, fever, or other concerning symptoms.   Wynelle Bourgeois CNM, MSN Certified Nurse-Midwife 10/10/2018 10:02 PM

## 2018-10-10 NOTE — Discharge Instructions (Signed)
Abnormal Uterine Bleeding Abnormal uterine bleeding is unusual bleeding from the uterus. It includes:  Bleeding or spotting between periods.  Bleeding after sex.  Bleeding that is heavier than normal.  Periods that last longer than usual.  Bleeding after menopause. Abnormal uterine bleeding can affect women at various stages in life, including teenagers, women in their reproductive years, pregnant women, and women who have reached menopause. Common causes of abnormal uterine bleeding include:  Pregnancy.  Growths of tissue (polyps).  A noncancerous tumor in the uterus (fibroid).  Infection.  Cancer.  Hormonal imbalances. Any type of abnormal bleeding should be evaluated by a health care provider. Many cases are minor and simple to treat, while others are more serious. Treatment will depend on the cause of the bleeding. Follow these instructions at home:  Monitor your condition for any changes.  Do not use tampons, douche, or have sex if told by your health care provider.  Change your pads often.  Get regular exams that include pelvic exams and cervical cancer screening.  Keep all follow-up visits as told by your health care provider. This is important. Contact a health care provider if:  Your bleeding lasts for more than one week.  You feel dizzy at times.  You feel nauseous or you vomit. Get help right away if:  You pass out.  Your bleeding soaks through a pad every hour.  You have abdominal pain.  You have a fever.  You become sweaty or weak.  You pass large blood clots from your vagina. Summary  Abnormal uterine bleeding is unusual bleeding from the uterus.  Any type of abnormal bleeding should be evaluated by a health care provider. Many cases are minor and simple to treat, while others are more serious.  Treatment will depend on the cause of the bleeding. This information is not intended to replace advice given to you by your health care provider.  Make sure you discuss any questions you have with your health care provider. Document Released: 09/19/2005 Document Revised: 10/21/2016 Document Reviewed: 10/21/2016 Elsevier Interactive Patient Education  [employer]. In late 2019, the [facility] will be moving to the [facility] campus. At that time, the MAU (Maternity Admissions Unit), where you are being seen today, will no longer take care of non-pregnant patients. We strongly encourage you to find a doctor's office before that time, so that you can be seen with any GYN concerns, like vaginal discharge, urinary tract infection, etc.. in a timely manner.  In order to make an office visit more convenient, the Center for [facility] at [facility] will be offering evening hours with same-day appointments, walk-in appointments and scheduled appointments available during this time.  Center for [facility] @ [facility] Hours: Monday - 8am - 7:30 pm with walk-in between 4pm- 7:30 pm Tuesday - 8 am - 5 pm (starting 01/02/18 we will be open late and accepting walk-ins from 4pm - 7:30pm) Wednesday - 8 am - 5 pm (starting 04/04/18 we will be open late and accepting walk-ins from 4pm - 7:30pm) Thursday 8 am - 5 pm (starting 07/05/18 we will be open late and accepting walk-ins from 4pm - 7:30pm) Friday 8 am - 5 pm  For an appointment please call the Center for [facility] @ [facility] at [phone]  For urgent needs, [facility] is also available for management of urgent GYN complaints such as vaginal discharge or urinary tract infections.

## 2018-10-10 NOTE — MAU Note (Signed)
Urine in lab 

## 2018-10-11 ENCOUNTER — Encounter: Payer: Self-pay | Admitting: Advanced Practice Midwife

## 2018-10-11 DIAGNOSIS — N939 Abnormal uterine and vaginal bleeding, unspecified: Secondary | ICD-10-CM | POA: Insufficient documentation

## 2018-10-11 LAB — GC/CHLAMYDIA PROBE AMP (~~LOC~~) NOT AT ARMC
CHLAMYDIA, DNA PROBE: NEGATIVE
NEISSERIA GONORRHEA: NEGATIVE

## 2018-10-11 LAB — TSH: TSH: 2.589 u[IU]/mL (ref 0.350–4.500)

## 2018-10-11 MED ORDER — MEDROXYPROGESTERONE ACETATE 10 MG PO TABS: 10.0000 mg | ORAL_TABLET | Freq: Every day | ORAL | 2 refills | Status: DC

## 2018-10-30 ENCOUNTER — Ambulatory Visit (HOSPITAL_COMMUNITY)
Admission: RE | Admit: 2018-10-30 | Discharge: 2018-10-30 | Disposition: A | Discharge status: Home / Self Care | Payer: No Typology Code available for payment source | Source: Ambulatory Visit | Attending: Advanced Practice Midwife | Admitting: Advanced Practice Midwife

## 2018-10-30 DIAGNOSIS — N939 Abnormal uterine and vaginal bleeding, unspecified: Secondary | ICD-10-CM | POA: Insufficient documentation

## 2018-11-07 ENCOUNTER — Telehealth: Payer: Self-pay | Admitting: *Deleted

## 2018-11-07 NOTE — Telephone Encounter (Signed)
Kassey called and left a message she is calling for results; her message cut off before she completed what she was saying.

## 2018-11-07 NOTE — Telephone Encounter (Signed)
Called patient to advise her of her results from her ultrasound, patient stated she had a ultrasound on 1/28 and wanted the results, advised patient of ultrasound recommendations and she verbalized understanding.

## 2019-01-14 ENCOUNTER — Other Ambulatory Visit: Payer: Self-pay

## 2019-01-14 ENCOUNTER — Encounter (HOSPITAL_COMMUNITY): Payer: Self-pay | Admitting: *Deleted

## 2019-01-14 ENCOUNTER — Inpatient Hospital Stay (HOSPITAL_COMMUNITY)
Admission: AD | Admit: 2019-01-14 | Discharge: 2019-01-14 | Disposition: A | Discharge status: Home / Self Care | Payer: Self-pay | Source: Ambulatory Visit | Attending: Obstetrics and Gynecology | Admitting: Obstetrics and Gynecology

## 2019-01-14 DIAGNOSIS — Z79899 Other long term (current) drug therapy: Secondary | ICD-10-CM | POA: Insufficient documentation

## 2019-01-14 DIAGNOSIS — F17210 Nicotine dependence, cigarettes, uncomplicated: Secondary | ICD-10-CM | POA: Insufficient documentation

## 2019-01-14 DIAGNOSIS — O210 Mild hyperemesis gravidarum: Secondary | ICD-10-CM | POA: Insufficient documentation

## 2019-01-14 DIAGNOSIS — Z3A08 8 weeks gestation of pregnancy: Secondary | ICD-10-CM

## 2019-01-14 DIAGNOSIS — O99611 Diseases of the digestive system complicating pregnancy, first trimester: Secondary | ICD-10-CM

## 2019-01-14 DIAGNOSIS — O219 Vomiting of pregnancy, unspecified: Secondary | ICD-10-CM

## 2019-01-14 DIAGNOSIS — O99331 Smoking (tobacco) complicating pregnancy, first trimester: Secondary | ICD-10-CM | POA: Insufficient documentation

## 2019-01-14 DIAGNOSIS — K117 Disturbances of salivary secretion: Secondary | ICD-10-CM

## 2019-01-14 LAB — URINALYSIS, ROUTINE W REFLEX MICROSCOPIC
Bilirubin Urine: NEGATIVE
Glucose, UA: 50 mg/dL — AB
Hgb urine dipstick: NEGATIVE
Ketones, ur: 80 mg/dL — AB
Leukocytes,Ua: NEGATIVE
Nitrite: NEGATIVE
Protein, ur: 30 mg/dL — AB
Specific Gravity, Urine: 1.019 (ref 1.005–1.030)
pH: 5 (ref 5.0–8.0)

## 2019-01-14 LAB — POCT PREGNANCY, URINE: Preg Test, Ur: POSITIVE — AB

## 2019-01-14 MED ORDER — PROMETHAZINE HCL 12.5 MG PO TABS: 12.5000 mg | ORAL_TABLET | Freq: Four times a day (QID) | ORAL | 0 refills | Status: DC | PRN

## 2019-01-14 MED ORDER — LACTATED RINGERS IV BOLUS
1000.0000 mL | Freq: Once | INTRAVENOUS | Status: AC
  Administered 2019-01-14: 1000 mL via INTRAVENOUS

## 2019-01-14 MED ORDER — M.V.I. ADULT IV INJ
Freq: Once | INTRAVENOUS | Status: AC
  Administered 2019-01-14: 10:00:00 via INTRAVENOUS
  Filled 2019-01-14: qty 10

## 2019-01-14 MED ORDER — GLYCOPYRROLATE 1 MG PO TABS: 1.0000 mg | ORAL_TABLET | Freq: Three times a day (TID) | ORAL | 0 refills | Status: DC

## 2019-01-14 MED ORDER — GLYCOPYRROLATE 0.2 MG/ML IJ SOLN
0.2000 mg | Freq: Once | INTRAMUSCULAR | Status: AC
  Administered 2019-01-14: 0.2 mg via INTRAVENOUS
  Filled 2019-01-14: qty 1

## 2019-01-14 MED ORDER — PROMETHAZINE HCL 25 MG/ML IJ SOLN
12.5000 mg | Freq: Once | INTRAMUSCULAR | Status: AC
  Administered 2019-01-14: 12.5 mg via INTRAVENOUS
  Filled 2019-01-14: qty 1

## 2019-01-14 NOTE — MAU Note (Addendum)
PT presents to MAU with complaints of nausea and vomiting for 3 days. Has been to [facility] and been evaluated by DR Billy Coast with this pregnancy. U/S with [facility] 08/20/19 Pt had wisdom teeth out on friday

## 2019-01-14 NOTE — Discharge Instructions (Signed)
Morning Sickness    Morning sickness is when you feel sick to your stomach (nauseous) during pregnancy. You may feel sick to your stomach and throw up (vomit). You may feel sick in the morning, but you can feel this way at any time of day. Some women feel very sick to their stomach and cannot stop throwing up (hyperemesis gravidarum).  Follow these instructions at home:  Medicines   Take over-the-counter and prescription medicines only as told by your doctor. Do not take any medicines until you talk with your doctor about them first.   Taking multivitamins before getting pregnant can stop or lessen the harshness of morning sickness.  Eating and drinking   Eat dry toast or crackers before getting out of bed.   Eat 5 or 6 small meals a day.   Eat dry and bland foods like rice and baked potatoes.   Do not eat greasy, fatty, or spicy foods.   Have someone cook for you if the smell of food causes you to feel sick or throw up.   If you feel sick to your stomach after taking prenatal vitamins, take them at night or with a snack.   Eat protein when you need a snack. Nuts, yogurt, and cheese are good choices.   Drink fluids throughout the day.   Try ginger ale made with real ginger, ginger tea made from fresh grated ginger, or ginger candies.  General instructions   Do not use any products that have nicotine or tobacco in them, such as cigarettes and e-cigarettes. If you need help quitting, ask your doctor.   Use an air purifier to keep the air in your house free of smells.   Get lots of fresh air.   Try to avoid smells that make you feel sick.   Try:  ? Wearing a bracelet that is used for seasickness (acupressure wristband).  ? Going to a doctor who puts thin needles into certain body points (acupuncture) to improve how you feel.  Contact a doctor if:   You need medicine to feel better.   You feel dizzy or light-headed.   You are losing weight.  Get help right away if:   You feel very sick to your  stomach and cannot stop throwing up.   You pass out (faint).   You have very bad pain in your belly.  Summary   Morning sickness is when you feel sick to your stomach (nauseous) during pregnancy.   You may feel sick in the morning, but you can feel this way at any time of day.   Making some changes to what you eat may help your symptoms go away.  This information is not intended to replace advice given to you by your health care provider. Make sure you discuss any questions you have with your health care provider.  Document Released: 10/27/2004 Document Revised: 10/20/2016 Document Reviewed: 10/20/2016  Elsevier Interactive Patient Education  2019 Elsevier Inc.

## 2019-01-14 NOTE — MAU Provider Note (Signed)
History     CSN: [id number]  Arrival date and time: 01/14/19 [id number]   First Provider Initiated Contact with Patient 01/14/19 [phone]      Chief Complaint  Patient presents with  . Morning Sickness   Rhonda [name] is a 28 y.o. G1P0 at 751w6d by LMP who presents to MAU with complaints of nausea and vomiting. She reports having nausea for the past 2 weeks without emesis but 2-3 days ago started having emesis. Reports emesis occurs over 10x per day. Denies being able to keep food or water down. In addition to emesis reports continued ptyalism. She denies abdominal pain or vaginal bleeding. She receives prenatal care at [facility].    OB History    Gravida  1   Para  0   Term  0   Preterm  0   AB  0   Living  0     SAB  0   TAB  0   Ectopic  0   Multiple  0   Live Births              Past Medical History:  Diagnosis Date  . Medical history non-contributory     Past Surgical History:  Procedure Laterality Date  . TONSILLECTOMY    . wisdom teeth      History reviewed. No pertinent family history.  Social History   Tobacco Use  . Smoking status: Current Some Day Smoker    Types: Cigarettes    Last attempt to quit: 09/30/2012    Years since quitting: 6.2  . Smokeless tobacco: Never Used  Substance Use Topics  . Alcohol use: Yes  . Drug use: No    Allergies: No Known Allergies  Medications Prior to Admission  Medication Sig Dispense Refill Last Dose  . Prenatal Vit-Fe Fumarate-FA (PRENATAL MULTIVITAMIN) TABS tablet Take 1 tablet by mouth daily at 12 noon.   Past Week at Unknown time  . benzonatate (TESSALON) 100 MG capsule Take 1 capsule (100 mg total) by mouth every 8 (eight) hours. 21 capsule 0   . ibuprofen (ADVIL,MOTRIN) 200 MG tablet Take 800 mg by mouth every 6 (six) hours as needed for headache or moderate pain.   Past Week at Unknown time  . medroxyPROGESTERone (PROVERA) 10 MG tablet Take 1 tablet (10 mg total) by mouth daily. Use for ten days  10 tablet 2   . Phenylephrine-DM-GG (MUCINEX FAST-MAX CONGEST COUGH) 2.5-5-100 MG/5ML LIQD Take 20 mLs by mouth every 6 (six) hours as needed (cough, cold).   Past Week at Unknown time  . predniSONE (DELTASONE) 50 MG tablet Take 1 tablet (50 mg total) by mouth daily. 5 tablet 0   . sulfamethoxazole-trimethoprim (BACTRIM DS,SEPTRA DS) 800-160 MG tablet Take 1 tablet by mouth 2 (two) times daily. (Patient not taking: Reported on 07/06/2017) 10 tablet 0 Completed Course at Unknown time    Review of Systems  Constitutional: Negative.   Respiratory: Negative.   Cardiovascular: Negative.   Gastrointestinal: Positive for nausea and vomiting. Negative for abdominal pain, constipation and diarrhea.  Genitourinary: Negative.   Musculoskeletal: Negative.    Physical Exam   Blood pressure 120/74, pulse (!) 58, temperature 97.8 F (36.6 C), resp. rate 16, last menstrual period 11/13/2018, SpO2 100 %.  Physical Exam  Nursing note and vitals reviewed. Constitutional: She is oriented to person, place, and time. She appears well-developed and well-nourished. No distress.  Cardiovascular: Normal rate, regular rhythm and normal heart sounds.  Respiratory: Effort normal and  breath sounds normal. No respiratory distress. She has no wheezes. She has no rales.  GI: Soft. She exhibits no distension. There is no abdominal tenderness. There is no rebound and no guarding.  Musculoskeletal: Normal range of motion.        General: No edema.  Neurological: She is alert and oriented to person, place, and time.   MAU Course  Procedures  MDM Orders Placed This Encounter  Procedures  . Urinalysis, Routine w reflex microscopic  . Pregnancy, urine POC  . Insert peripheral IV   Meds ordered this encounter  Medications  . lactated ringers bolus 1,000 mL  . promethazine (PHENERGAN) injection 12.5 mg  . multivitamins adult (INFUVITE ADULT) 10 mL in lactated ringers 1,000 mL infusion  . glycopyrrolate (ROBINUL)  injection 0.2 mg   Labs reviewed:  Results for orders placed or performed during the hospital encounter of 01/14/19 (from the past 24 hour(s))  Urinalysis, Routine w reflex microscopic     Status: Abnormal   Collection Time: 01/14/19  7:47 AM  Result Value Ref Range   Color, Urine YELLOW YELLOW   APPearance HAZY (A) CLEAR   Specific Gravity, Urine 1.019 1.005 - 1.030   pH 5.0 5.0 - 8.0   Glucose, UA 50 (A) NEGATIVE mg/dL   Hgb urine dipstick NEGATIVE NEGATIVE   Bilirubin Urine NEGATIVE NEGATIVE   Ketones, ur 80 (A) NEGATIVE mg/dL   Protein, ur 30 (A) NEGATIVE mg/dL   Nitrite NEGATIVE NEGATIVE   Leukocytes,Ua NEGATIVE NEGATIVE   RBC / HPF 0-5 0 - 5 RBC/hpf   WBC, UA 0-5 0 - 5 WBC/hpf   Bacteria, UA RARE (A) NONE SEEN   Squamous Epithelial / LPF 0-5 0 - 5   Mucus PRESENT   Pregnancy, urine POC     Status: Abnormal   Collection Time: 01/14/19  7:53 AM  Result Value Ref Range   Preg Test, Ur POSITIVE (A) NEGATIVE   Treatments in MAU included IV hydration due to increased ketones in UA, Phenergan and Robinul IV in addition to banana bag.   Patient passed PO challenge prior to discharge home. Able to eat crackers and drink gingerale.   Discussed reasons to return to MAU. COVID19 precautions discussed. Follow up as scheduled in the office. Rx for Phenergan and Robinul sent to pharmacy of choice. Pt stable at time of discharge.   Assessment and Plan   1. Nausea and vomiting during pregnancy   2. [redacted] weeks gestation of pregnancy   3. Ptyalism    Follow-up Information    [facility].   Why:  Follow up as scheduled for prenatal appointments  Contact information: [address] [place] [address] [phone]          Discharge home Follow up as scheduled  Return to MAU as needed  Rx for Phenergan and Robinul   Sharyon Cable CNM 01/14/2019, 12:24 PM

## 2019-01-15 ENCOUNTER — Other Ambulatory Visit: Payer: Self-pay

## 2019-01-15 ENCOUNTER — Inpatient Hospital Stay (HOSPITAL_COMMUNITY)
Admission: AD | Admit: 2019-01-15 | Discharge: 2019-01-15 | Disposition: A | Discharge status: Home / Self Care | Payer: Self-pay | Attending: Obstetrics and Gynecology | Admitting: Obstetrics and Gynecology

## 2019-01-15 ENCOUNTER — Encounter (HOSPITAL_COMMUNITY): Payer: Self-pay | Admitting: *Deleted

## 2019-01-15 DIAGNOSIS — F17210 Nicotine dependence, cigarettes, uncomplicated: Secondary | ICD-10-CM | POA: Insufficient documentation

## 2019-01-15 DIAGNOSIS — F1291 Cannabis use, unspecified, in remission: Secondary | ICD-10-CM

## 2019-01-15 DIAGNOSIS — O219 Vomiting of pregnancy, unspecified: Secondary | ICD-10-CM | POA: Insufficient documentation

## 2019-01-15 DIAGNOSIS — Z87898 Personal history of other specified conditions: Secondary | ICD-10-CM

## 2019-01-15 DIAGNOSIS — O26891 Other specified pregnancy related conditions, first trimester: Secondary | ICD-10-CM | POA: Insufficient documentation

## 2019-01-15 DIAGNOSIS — Z3A09 9 weeks gestation of pregnancy: Secondary | ICD-10-CM | POA: Insufficient documentation

## 2019-01-15 LAB — URINALYSIS, ROUTINE W REFLEX MICROSCOPIC
Bilirubin Urine: NEGATIVE
Glucose, UA: NEGATIVE mg/dL
Hgb urine dipstick: NEGATIVE
Ketones, ur: 80 mg/dL — AB
Leukocytes,Ua: NEGATIVE
Nitrite: NEGATIVE
Protein, ur: NEGATIVE mg/dL
Specific Gravity, Urine: 1.016 (ref 1.005–1.030)
pH: 6 (ref 5.0–8.0)

## 2019-01-15 MED ORDER — SCOPOLAMINE 1 MG/3DAYS TD PT72
1.0000 | MEDICATED_PATCH | Freq: Once | TRANSDERMAL | Status: DC
  Administered 2019-01-15: 12:00:00 1.5 mg via TRANSDERMAL
  Filled 2019-01-15: qty 1

## 2019-01-15 MED ORDER — PROMETHAZINE HCL 25 MG RE SUPP: 25.0000 mg | Freq: Four times a day (QID) | RECTAL | 0 refills | Status: DC | PRN

## 2019-01-15 MED ORDER — FAMOTIDINE 20 MG PO TABS: 20.0000 mg | ORAL_TABLET | Freq: Two times a day (BID) | ORAL | 0 refills | Status: DC

## 2019-01-15 MED ORDER — PROMETHAZINE HCL 25 MG/ML IJ SOLN
12.5000 mg | Freq: Once | INTRAMUSCULAR | Status: AC
  Administered 2019-01-15: 14:00:00 12.5 mg via INTRAVENOUS
  Filled 2019-01-15: qty 1

## 2019-01-15 MED ORDER — FAMOTIDINE 20 MG IN NS 100 ML IVPB
20.0000 mg | Freq: Once | INTRAVENOUS | Status: AC
  Administered 2019-01-15: 20 mg via INTRAVENOUS
  Filled 2019-01-15: qty 100

## 2019-01-15 MED ORDER — GLYCOPYRROLATE 1 MG PO TABS
1.0000 mg | ORAL_TABLET | Freq: Once | ORAL | Status: AC
  Administered 2019-01-15: 11:00:00 1 mg via ORAL
  Filled 2019-01-15: qty 1

## 2019-01-15 MED ORDER — SCOPOLAMINE 1 MG/3DAYS TD PT72: 1.0000 | MEDICATED_PATCH | TRANSDERMAL | 1 refills | Status: DC

## 2019-01-15 MED ORDER — LACTATED RINGERS IV BOLUS
1000.0000 mL | Freq: Once | INTRAVENOUS | Status: AC
  Administered 2019-01-15: 12:00:00 1000 mL via INTRAVENOUS

## 2019-01-15 MED ORDER — CALCIUM CARBONATE ANTACID 500 MG PO CHEW
400.0000 mg | CHEWABLE_TABLET | Freq: Once | ORAL | Status: AC
  Administered 2019-01-15: 400 mg via ORAL
  Filled 2019-01-15: qty 2

## 2019-01-15 MED ORDER — ONDANSETRON 4 MG PO TBDP
8.0000 mg | ORAL_TABLET | Freq: Once | ORAL | Status: AC
  Administered 2019-01-15: 10:00:00 8 mg via ORAL
  Filled 2019-01-15: qty 2

## 2019-01-15 MED ORDER — FAMOTIDINE IN NACL 20-0.9 MG/50ML-% IV SOLN: 20.0000 mg | Freq: Once | INTRAVENOUS | Status: DC

## 2019-01-15 MED ORDER — METOCLOPRAMIDE HCL 10 MG PO TABS
10.0000 mg | ORAL_TABLET | Freq: Once | ORAL | Status: AC
  Administered 2019-01-15: 11:00:00 10 mg via ORAL
  Filled 2019-01-15: qty 1

## 2019-01-15 MED ORDER — PANTOPRAZOLE SODIUM 40 MG PO TBEC
40.0000 mg | DELAYED_RELEASE_TABLET | Freq: Once | ORAL | Status: AC
  Administered 2019-01-15: 40 mg via ORAL
  Filled 2019-01-15: qty 1

## 2019-01-15 MED ORDER — PROMETHAZINE HCL 25 MG RE SUPP
12.5000 mg | Freq: Once | RECTAL | Status: AC
  Administered 2019-01-15: 09:00:00 12.5 mg via RECTAL
  Filled 2019-01-15: qty 1

## 2019-01-15 NOTE — MAU Note (Signed)
Took both her meds this morning, threw them up.  Doesn't think she can do this.  Took her meds last night, not keeping anything down. Seeing blood when she throws up.

## 2019-01-15 NOTE — MAU Provider Note (Signed)
History     CSN: [id number]  Arrival date and time: 01/15/19 [id number]   First Provider Initiated Contact with Patient 01/15/19 [phone]      Chief Complaint  Patient presents with  . Emesis   Rhonda [name] is a 28 y.o. G1P0 at 872w0d who presents for Emesis.  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  Patient reports eating ice cream last night and has been drinking water.  She reports taking promethazine this morning and last night, but threw it up both times.  She reports constant chest pain that she describes as burning.  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      OB History    Gravida  1   Para  0   Term  0   Preterm  0   AB  0   Living  0     SAB  0   TAB  0   Ectopic  0   Multiple  0   Live Births              Past Medical History:  Diagnosis Date  . Medical history non-contributory     Past Surgical History:  Procedure Laterality Date  . TONSILLECTOMY    . wisdom teeth      No family history on file.  Social History   Tobacco Use  . Smoking status: Current Some Day Smoker    Types: Cigarettes    Last attempt to quit: 09/30/2012    Years since quitting: 6.2  . Smokeless tobacco: Never Used  Substance Use Topics  . Alcohol use: Yes  . Drug use: No    Allergies: No Known Allergies  Medications Prior to Admission  Medication Sig Dispense Refill Last Dose  . benzonatate (TESSALON) 100 MG capsule Take 1 capsule (100 mg total) by mouth every 8 (eight) hours. 21 capsule 0   . glycopyrrolate (ROBINUL) 1 MG tablet Take 1 tablet (1 mg total) by mouth 3 (three) times daily. 30 tablet 0   . Phenylephrine-DM-GG (MUCINEX FAST-MAX CONGEST COUGH) 2.5-5-100 MG/5ML LIQD Take 20 mLs by  mouth every 6 (six) hours as needed (cough, cold).   Past Week at Unknown time  . Prenatal Vit-Fe Fumarate-FA (PRENATAL MULTIVITAMIN) TABS tablet Take 1 tablet by mouth daily at 12 noon.   Past Week at Unknown time  . promethazine (PHENERGAN) 12.5 MG tablet Take 1 tablet (12.5 mg total) by mouth every 6 (six) hours as needed for nausea or vomiting. 30 tablet 0     Review of Systems  Constitutional: Positive for chills. Negative for fever.  Respiratory: Negative for cough and shortness of breath.   Gastrointestinal: Positive for nausea and vomiting. Negative for abdominal pain, constipation and diarrhea.  Genitourinary: Negative for vaginal bleeding and vaginal discharge.  Neurological: Negative for dizziness, light-headedness and headaches.   Physical Exam   Blood pressure 115/60, pulse 66, temperature 98.1 F (36.7 C), temperature source Oral, resp. rate 16, weight 65.9 kg, last menstrual period 11/13/2018, SpO2 99 %.  Physical Exam  Constitutional: She is oriented to person, place, and time. She appears well-developed and well-nourished. She appears distressed.  HENT:  Head:  Normocephalic and atraumatic.  Mouth/Throat: Mucous membranes are dry.  Eyes: Conjunctivae are normal.  Neck: Normal range of motion.  Cardiovascular: Normal rate.  Respiratory: Effort normal.  Musculoskeletal: Normal range of motion.  Neurological: She is alert and oriented to person, place, and time.  Skin: Skin is warm and dry.  Psychiatric: She has a normal mood and affect. Her behavior is normal.    MAU Course  Procedures Results for orders placed or performed during the hospital encounter of 01/15/19 (from the past 24 hour(s))  Urinalysis, Routine w reflex microscopic     Status: Abnormal   Collection Time: 01/15/19  8:20 AM  Result Value Ref Range   Color, Urine YELLOW YELLOW   APPearance HAZY (A) CLEAR   Specific Gravity, Urine 1.016 1.005 - 1.030   pH 6.0 5.0 - 8.0   Glucose, UA NEGATIVE  NEGATIVE mg/dL   Hgb urine dipstick NEGATIVE NEGATIVE   Bilirubin Urine NEGATIVE NEGATIVE   Ketones, ur 80 (A) NEGATIVE mg/dL   Protein, ur NEGATIVE NEGATIVE mg/dL   Nitrite NEGATIVE NEGATIVE   Leukocytes,Ua NEGATIVE NEGATIVE    MDM PE Antiemetics Proton Pump Inhibitors Oral Hydration and Intake Start IV Education & Encouragement Assessment and Plan  28 year old G1P0 at 25 weeks N/V H/O MJ Usage  -Exam findings discussed. -Educated on how prior MJ usage can cause intense symptoms of n/v during pregnancy. -Informed that would attempt oral/rectal administration of medications. -patient agreeable and requests ice chips and ginger ale. -Will given phenergan 12.5mg  rectally, Zofran 8mg  ODT, f/b Protonix 40mg . -Will reassess after protonix administration.   Follow Up (10:55 AM) Patient tearful in bed reports inability to keep protonix down, but states the nausea has improved.  However, she states that she does not feel any better since arrival. States that "whatever they did yesterday really helped." Explained to patient that the goal today is for oral medication administration to promote at home management.  Discussed need for proper diet and taking of medication for stability of symptoms.  Patient verbalizes understanding, but remains upset.  Follow Up (11:48 PM)  -Patient reports vomiting s/p dosing of medications -Will start IV and give fluids with pepcid. -Will apply scopolamine patch. -Discussed initiation of IV therapy. -Patient reports immense fear and states pain is "too much."  Reports pain in chest in form of burning. -Requests that provider remain in room with her.  Informed this is not possible for provider to stay for extended periods of time. -Patient reports she "doesn't know if she can do this" and is considering returning to [place] with family. -Reassurances given that things/symptoms will improve...eventually. -No further q/c -Will reassess upon completion  of IV fluids.   Follow Up (2:06 PM) -Patient reports continued nausea despite IV phenergan dosing.  Questions "when will the nausea end." -Educated on expectation of N/V during pregnancy and how goal is to have manageable N/V. -Encouraged to take medication as prescribed and contact primary ob for further evaluation and management. -Patient without further questions or concerns. -Rx for phenergan 25 mg Rectal Suppository, 20mg  Pepcid, and Scopolamine Patches sent to pharmacy on file.  -Encouraged to call primary ob or return to MAU if symptoms worsen or with the onset of new symptoms. -Discharged to home in stable condition  Cherre Robins MSN, CNM 01/15/2019, 8:38 AM

## 2019-01-15 NOTE — Discharge Instructions (Signed)
Cannabinoid Hyperemesis Syndrome °Cannabinoid hyperemesis syndrome (CHS) is a condition that causes repeated nausea, vomiting, and abdominal pain after long-term (chronic) use of marijuana (cannabis). People with CHS typically use marijuana 3-5 times a day for many years before they have symptoms, although it is possible to develop CHS with as little as 1 use per day. °Symptoms of CHS may be mild at first but can get worse and more frequent. In some cases, CHS may cause vomiting many times a day, which can lead to weight loss and dehydration. CHS may go away and come back many times (recur). People may not have symptoms or may otherwise be healthy in between CHS attacks. °What are the causes? °The exact cause of this condition is not known. Long-term use of marijuana may over-stimulate certain proteins in the brain that react with chemicals in marijuana (cannabinoid receptors). This over-stimulation may cause CHS. °What are the signs or symptoms? °Symptoms of this condition are often mild during the first few attacks, but they can get worse over time. Symptoms may include: °· Frequent nausea, especially early in the morning. °· Vomiting. °· Abdominal pain. °Taking several hot showers throughout the day can also be a sign of this condition. People with CHS may do this because it relieves symptoms. °How is this diagnosed? °This condition may be diagnosed based on: °· Your symptoms and medical history, including any drug use. °· A physical exam. °You may have tests done to rule out other problems. These tests may include: °· Blood tests. °· Urine tests. °· Imaging tests, such as an X-ray or CT scan. °How is this treated? °Treatment for this condition involves stopping marijuana use. Your health care provider may recommend: °· A drug rehabilitation program, if you have trouble stopping marijuana use. °· Medicines for nausea. °· Hot showers to help relieve symptoms. °Certain creams that contain a substance called  capsaicin may improve symptoms when applied to the abdomen. Ask your health care provider before starting any medicines or other treatments. °Severe nausea and vomiting may require you to stay at the hospital. You may need IV fluids to prevent or treat dehydration. You may also need certain medicines that must be given at the hospital. °Follow these instructions at home: °During an attack ° °· Stay in bed and rest in a dark, quiet room. °· Take anti-nausea medicine as told by your health care provider. °· Try taking hot showers to relieve your symptoms. °After an attack °· Drink small amounts of clear fluids slowly. Gradually add more. °· Once you are able to eat without vomiting, eat soft foods in small amounts every 3-4 hours. °General instructions ° °· Do not use any products that contain marijuana.If you need help quitting, ask your health care provider for resources and treatment options. °· Drink enough fluid to keep your urine pale yellow. Avoid drinking fluids that have a lot of sugar or caffeine, such as coffee and soda. °· Take and apply over-the-counter and prescription medicines only as told by your health care provider. Ask your health care provider before starting any new medicines or treatments. °· Keep all follow-up visits as told by your health care provider. This is important. °Contact a health care provider if: °· Your symptoms get worse. °· You cannot drink fluids without vomiting. °· You have pain and trouble swallowing after an attack. °Get help right away if: °· You cannot stop vomiting. °· You have blood in your vomit or your vomit looks like coffee grounds. °· You have   severe abdominal pain.  You have stools that are bloody or black, or stools that look like tar.  You have symptoms of dehydration, such as: ? Sunken eyes. ? Inability to make tears. ? Cracked lips. ? Dry mouth. ? Decreased urine production. ? Weakness. ? Sleepiness. ? Fainting. Summary  Cannabinoid hyperemesis  syndrome (CHS) is a condition that causes repeated nausea, vomiting, and abdominal pain after long-term use of marijuana.  People with CHS typically use marijuana 3-5 times a day for many years before they have symptoms, although it is possible to develop CHS with as little as 1 use per day.  Treatment for this condition involves stopping marijuana use. Hot showers and capsaicin creams may also help relieve symptoms. Ask your health care provider before starting any medicines or other treatments.  Your health care provider may prescribe medicines to help with nausea.  Get help right away if you have signs of dehydration, such as dry mouth, decreased urine production, or weakness. This information is not intended to replace advice given to you by your health care provider. Make sure you discuss any questions you have with your health care provider. Document Released: 12/28/2016 Document Revised: 12/28/2016 Document Reviewed: 12/28/2016 Elsevier Interactive Patient Education  2019 [employer]   Parke SimmersBland Diet A bland diet consists of foods that are often soft and do not have a lot of fat, fiber, or extra seasonings. Foods without fat, fiber, or seasoning are easier for the body to digest. They are also less likely to irritate your mouth, throat, stomach, and other parts of your digestive system. A bland diet is sometimes called a BRAT diet. What is my plan? Your health care provider or food and nutrition specialist (dietitian) may recommend specific changes to your diet to prevent symptoms or to treat your symptoms. These changes may include:  Eating small meals often.  Cooking food until it is soft enough to chew easily.  Chewing your food well.  Drinking fluids slowly.  Not eating foods that are very spicy, sour, or fatty.  Not eating citrus fruits, such as oranges and grapefruit. What do I need to know about this diet?  Eat a variety of foods from the bland diet food list.  Do not  follow a bland diet longer than needed.  Ask your health care provider whether you should take vitamins or supplements. What foods can I eat? Grains  Hot cereals, such as cream of wheat. Rice. Bread, crackers, or tortillas made from refined white flour. Vegetables Canned or cooked vegetables. Mashed or boiled potatoes. Fruits  Bananas. Applesauce. Other types of cooked or canned fruit with the skin and seeds removed, such as canned peaches or pears. Meats and other proteins  Scrambled eggs. Creamy peanut butter or other nut butters. Lean, well-cooked meats, such as chicken or fish. Tofu. Soups or broths. Dairy Low-fat dairy products, such as milk, cottage cheese, or yogurt. Beverages  Water. Herbal tea. Apple juice. Fats and oils Mild salad dressings. Canola or olive oil. Sweets and desserts Pudding. Custard. Fruit gelatin. Ice cream. The items listed above may not be a complete list of recommended foods and beverages. Contact a dietitian for more options. What foods are not recommended? Grains Whole grain breads and cereals. Vegetables Raw vegetables. Fruits Raw fruits, especially citrus, berries, or dried fruits. Dairy Whole fat dairy foods. Beverages Caffeinated drinks. Alcohol. Seasonings and condiments Strongly flavored seasonings or condiments. Hot sauce. Salsa. Other foods Spicy foods. Fried foods. Sour foods, such as pickled or  fermented foods. Foods with high sugar content. Foods high in fiber. The items listed above may not be a complete list of foods and beverages to avoid. Contact a dietitian for more information. Summary  A bland diet consists of foods that are often soft and do not have a lot of fat, fiber, or extra seasonings.  Foods without fat, fiber, or seasoning are easier for the body to digest.  Check with your health care provider to see how long you should follow this diet plan. It is not meant to be followed for long periods. This information is  not intended to replace advice given to you by your health care provider. Make sure you discuss any questions you have with your health care provider. Document Released: 01/11/2016 Document Revised: 10/18/2017 Document Reviewed: 10/18/2017 Elsevier Interactive Patient Education  2019 [employer].

## 2019-01-16 ENCOUNTER — Encounter (HOSPITAL_COMMUNITY): Payer: Self-pay

## 2019-01-16 ENCOUNTER — Inpatient Hospital Stay (HOSPITAL_COMMUNITY)
Admission: AD | Admit: 2019-01-16 | Discharge: 2019-01-17 | Disposition: A | Discharge status: Home / Self Care | Payer: Self-pay | Attending: Obstetrics and Gynecology | Admitting: Obstetrics and Gynecology

## 2019-01-16 ENCOUNTER — Other Ambulatory Visit: Payer: Self-pay

## 2019-01-16 DIAGNOSIS — O99331 Smoking (tobacco) complicating pregnancy, first trimester: Secondary | ICD-10-CM | POA: Insufficient documentation

## 2019-01-16 DIAGNOSIS — O99611 Diseases of the digestive system complicating pregnancy, first trimester: Secondary | ICD-10-CM

## 2019-01-16 DIAGNOSIS — K117 Disturbances of salivary secretion: Secondary | ICD-10-CM | POA: Insufficient documentation

## 2019-01-16 DIAGNOSIS — F17210 Nicotine dependence, cigarettes, uncomplicated: Secondary | ICD-10-CM | POA: Insufficient documentation

## 2019-01-16 DIAGNOSIS — R079 Chest pain, unspecified: Secondary | ICD-10-CM | POA: Insufficient documentation

## 2019-01-16 DIAGNOSIS — O219 Vomiting of pregnancy, unspecified: Secondary | ICD-10-CM | POA: Insufficient documentation

## 2019-01-16 DIAGNOSIS — O26891 Other specified pregnancy related conditions, first trimester: Secondary | ICD-10-CM | POA: Insufficient documentation

## 2019-01-16 DIAGNOSIS — Z3A09 9 weeks gestation of pregnancy: Secondary | ICD-10-CM | POA: Insufficient documentation

## 2019-01-16 DIAGNOSIS — O210 Mild hyperemesis gravidarum: Secondary | ICD-10-CM | POA: Insufficient documentation

## 2019-01-16 MED ORDER — DEXAMETHASONE SODIUM PHOSPHATE 10 MG/ML IJ SOLN
10.0000 mg | Freq: Once | INTRAMUSCULAR | Status: AC
  Administered 2019-01-17: 10 mg via INTRAVENOUS
  Filled 2019-01-16: qty 1

## 2019-01-16 MED ORDER — SODIUM CHLORIDE 0.9 % IV SOLN
25.0000 mg | Freq: Once | INTRAVENOUS | Status: AC
  Administered 2019-01-17: 25 mg via INTRAVENOUS
  Filled 2019-01-16 (×2): qty 1

## 2019-01-16 MED ORDER — LACTATED RINGERS IV SOLN: INTRAVENOUS | Status: DC

## 2019-01-16 NOTE — MAU Note (Signed)
Pt not actively vomiting, just spitting. Asking for ice chips.

## 2019-01-16 NOTE — MAU Provider Note (Signed)
Chief Complaint: Emesis   First Provider Initiated Contact with Patient 01/16/19 2340        SUBJECTIVE HPI: Rhonda [name] is a 28 y.o. G1P0000 at [redacted]w[redacted]d by LMP who presents to maternity admissions reporting persistent vomiting.  States Dr Billy Coast told her to come to the hospital.  States has been taking all of her medicines and is unable to keep anything down or stop vomiting.  States she wants the Promethazine "that makes me go to sleep".  States she takes the promethazine suppositories but they don't make her go to sleep as well as the IV kind.  States "nothing is working, I cannot do this, I cannot go home".  . She denies vaginal bleeding, vaginal itching/burning, urinary symptoms, h/a, dizziness, or fever/chills.    Emesis   This is a recurrent problem. The problem occurs more than 10 times per day. The problem has been gradually worsening. There has been no fever. Associated symptoms include chest pain (burning in esophagus). Pertinent negatives include no abdominal pain, chills, coughing, diarrhea, dizziness, fever or headaches. Treatments tried: "all the meds" The treatment provided no relief.   RN Note: Pt here for vomiting. States she is unable to keep anything down. States she called her OB and he told her to come in for IV. Pt states was RX'd promethazine, famotid, robinol and zofran. States she last took them a few hours ago and she threw them back up. States she used promethazine suppository and it has not helped. States "the only thing that has helped is IV promethazine." Reports 10/10 burning pain in chest.   Past Medical History:  Diagnosis Date  . Medical history non-contributory    Past Surgical History:  Procedure Laterality Date  . TONSILLECTOMY    . wisdom teeth     Social History   Socioeconomic History  . Marital status: Single    Spouse name: Not on file  . Number of children: Not on file  . Years of education: Not on file  . Highest education level: Not on file   Occupational History  . Not on file  Social Needs  . Financial resource strain: Not on file  . Food insecurity:    Worry: Not on file    Inability: Not on file  . Transportation needs:    Medical: Not on file    Non-medical: Not on file  Tobacco Use  . Smoking status: Current Some Day Smoker    Types: Cigarettes    Last attempt to quit: 09/30/2012    Years since quitting: 6.2  . Smokeless tobacco: Never Used  Substance and Sexual Activity  . Alcohol use: Yes  . Drug use: No  . Sexual activity: Yes    Birth control/protection: None  Lifestyle  . Physical activity:    Days per week: Not on file    Minutes per session: Not on file  . Stress: Not on file  Relationships  . Social connections:    Talks on phone: Not on file    Gets together: Not on file    Attends religious service: Not on file    Active member of club or organization: Not on file    Attends meetings of clubs or organizations: Not on file    Relationship status: Not on file  . Intimate partner violence:    Fear of current or ex partner: Not on file    Emotionally abused: Not on file    Physically abused: Not on file  Forced sexual activity: Not on file  Other Topics Concern  . Not on file  Social History Narrative  . Not on file   No current facility-administered medications on file prior to encounter.    Current Outpatient Medications on File Prior to Encounter  Medication Sig Dispense Refill  . famotidine (PEPCID) 20 MG tablet Take 1 tablet (20 mg total) by mouth 2 (two) times daily. 30 tablet 0  . glycopyrrolate (ROBINUL) 1 MG tablet Take 1 tablet (1 mg total) by mouth 3 (three) times daily. 30 tablet 0  . Prenatal Vit-Fe Fumarate-FA (PRENATAL MULTIVITAMIN) TABS tablet Take 1 tablet by mouth daily at 12 noon.    . promethazine (PHENERGAN) 25 MG suppository Place 1 suppository (25 mg total) rectally every 6 (six) hours as needed for nausea or vomiting. 12 each 0  . scopolamine (TRANSDERM-SCOP) 1  MG/3DAYS Place 1 patch (1.5 mg total) onto the skin every 3 (three) days. 4 patch 1   No Known Allergies  I have reviewed patient's Past Medical Hx, Surgical Hx, Family Hx, Social Hx, medications and allergies.   ROS:  Review of Systems  Constitutional: Negative for chills and fever.  Respiratory: Negative for cough.   Cardiovascular: Positive for chest pain (burning in esophagus).  Gastrointestinal: Positive for vomiting. Negative for abdominal pain and diarrhea.  Neurological: Negative for dizziness and headaches.   Review of Systems  Other systems negative   Physical Exam  Physical Exam Vitals:   01/16/19 2346 01/17/19 0500  BP: 103/63 116/71  Pulse: (!) 57 77  Resp: 16 16  Temp: 98.3 F (36.8 C)   TempSrc: Oral   SpO2: 100%    Constitutional: Well-developed female in no acute distress.  Cardiovascular: normal rate and no tachycardia.   Respiratory: normal effort GI: Abd soft, non-tender. Pos BS x 4 MS: Extremities nontender, no edema, normal ROM Neurologic: Alert and oriented x 4.  GU: Neg CVAT.  PELVIC EXAM: deferred  LAB RESULTS Results for orders placed or performed during the hospital encounter of 01/16/19 (from the past 24 hour(s))  Urinalysis, Routine w reflex microscopic     Status: Abnormal   Collection Time: 01/16/19 11:18 PM  Result Value Ref Range   Color, Urine YELLOW YELLOW   APPearance CLEAR CLEAR   Specific Gravity, Urine 1.018 1.005 - 1.030   pH 6.0 5.0 - 8.0   Glucose, UA NEGATIVE NEGATIVE mg/dL   Hgb urine dipstick NEGATIVE NEGATIVE   Bilirubin Urine NEGATIVE NEGATIVE   Ketones, ur 80 (A) NEGATIVE mg/dL   Protein, ur NEGATIVE NEGATIVE mg/dL   Nitrite NEGATIVE NEGATIVE   Leukocytes,Ua NEGATIVE NEGATIVE     IMAGING No results found.  MAU Management/MDM: IV inserted Phenergan IV infusion (in NS) infused x 1 liter.  A second liter of LR was given. Decadron given IV. Robinul given for ptyalism. States is taking it at home around the  clock.  Patient spits frequently .   Pyridoxine 100mg  given as supplement to other antiemetics. Discussed we may want to add this to daily regimen as 50mg  bid  Observed to vomit once on floor.  States vomited 2-3 other times but no evidence of this was found.  Frequent spitting into emesis bag.  Patient found sleeping every time someone went in.  Tolerated PO fluids well but did "guzzle" it according to RN. Advised to sip water.   Upon discharge, pt asked RN what criteria she needed in order to get admitted and to get a feeding tube.  States wants a feeding tube "so I can eat".    ASSESSMENT Single intrauterine pregnancy at 4464w2d Hyperemesis Ptyalism  PLAN Discharge home Rx Pyridoxine 50mg  bid Discussed PO intake and medication balancing. Advised to call Dr Billy Coastaavon this morning to update him and to ask about feeding tube and her desire for admission.   Pt stable at time of discharge. Encouraged to return here or to other Urgent Care/ED if she develops worsening of symptoms, increase in pain, fever, or other concerning symptoms.    Wynelle BourgeoisMarie Williams CNM, MSN Certified Nurse-Midwife 01/16/2019  11:40 PM

## 2019-01-16 NOTE — MAU Note (Addendum)
Pt here for vomiting. States she is unable to keep anything down. States she called her OB and he told her to come in for IV. Pt states was RX'd promethazine, famotid, robinol and zofran. States she last took them a few hours ago and she threw them back up. States she used promethazine suppository and it has not helped. States "the only thing that has helped is IV promethazine." Reports 10/10 burning pain in chest.

## 2019-01-17 ENCOUNTER — Other Ambulatory Visit: Payer: Self-pay | Admitting: Obstetrics and Gynecology

## 2019-01-17 LAB — URINALYSIS, ROUTINE W REFLEX MICROSCOPIC
Bilirubin Urine: NEGATIVE
Glucose, UA: NEGATIVE mg/dL
Hgb urine dipstick: NEGATIVE
Ketones, ur: 80 mg/dL — AB
Leukocytes,Ua: NEGATIVE
Nitrite: NEGATIVE
Protein, ur: NEGATIVE mg/dL
Specific Gravity, Urine: 1.018 (ref 1.005–1.030)
pH: 6 (ref 5.0–8.0)

## 2019-01-17 MED ORDER — GLYCOPYRROLATE 0.2 MG/ML IJ SOLN
0.1000 mg | Freq: Once | INTRAMUSCULAR | Status: AC
  Administered 2019-01-17: 04:00:00 0.1 mg via INTRAVENOUS
  Filled 2019-01-17: qty 1

## 2019-01-17 MED ORDER — VITAMIN B-6 50 MG PO TABS: 50.0000 mg | ORAL_TABLET | Freq: Two times a day (BID) | ORAL | 0 refills | Status: DC

## 2019-01-17 MED ORDER — PYRIDOXINE HCL 100 MG/ML IJ SOLN
100.0000 mg | Freq: Once | INTRAMUSCULAR | Status: AC
  Administered 2019-01-17: 02:00:00 100 mg via INTRAVENOUS
  Filled 2019-01-17: qty 1

## 2019-01-17 NOTE — MAU Note (Addendum)
Upon entering room, patient is sound asleep. Pt reports chest is burning and she almost threw up again but did not vomit. Pt then went back to sleep.

## 2019-01-17 NOTE — Discharge Instructions (Signed)
Hyperemesis Gravidarum  Hyperemesis gravidarum is a severe form of nausea and vomiting that happens during pregnancy. Hyperemesis is worse than morning sickness. It may cause you to have nausea or vomiting all day for many days. It may keep you from eating and drinking enough food and liquids, which can lead to dehydration, malnutrition, and weight loss. Hyperemesis usually occurs during the first half (the first 20 weeks) of pregnancy. It often goes away once a woman is in her second half of pregnancy. However, sometimes hyperemesis continues through an entire pregnancy.  What are the causes?  The cause of this condition is not known. It may be related to changes in chemicals (hormones) in the body during pregnancy, such as the high level of pregnancy hormone (human chorionic gonadotropin) or the increase in the female sex hormone (estrogen).  What are the signs or symptoms?  Symptoms of this condition include:  Nausea that does not go away.  Vomiting that does not allow you to keep any food down.  Weight loss.  Body fluid loss (dehydration).  Having no desire to eat, or not liking food that you have previously enjoyed.  How is this diagnosed?  This condition may be diagnosed based on:  A physical exam.  Your medical history.  Your symptoms.  Blood tests.  Urine tests.  How is this treated?  This condition is managed by controlling symptoms. This may include:  Following an eating plan. This can help lessen nausea and vomiting.  Taking prescription medicines.  An eating plan and medicines are often used together to help control symptoms. If medicines do not help relieve nausea and vomiting, you may need to receive fluids through an IV at the hospital.  Follow these instructions at home:  Eating and drinking    Avoid the following:  Drinking fluids with meals. Try not to drink anything during the 30 minutes before and after your meals.  Drinking more than 1 cup of fluid at a time.  Eating foods that trigger your  symptoms. These may include spicy foods, coffee, high-fat foods, very sweet foods, and acidic foods.  Skipping meals. Nausea can be more intense on an empty stomach. If you cannot tolerate food, do not force it. Try sucking on ice chips or other frozen items and make up for missed calories later.  Lying down within 2 hours after eating.  Being exposed to environmental triggers. These may include food smells, smoky rooms, closed spaces, rooms with strong smells, warm or humid places, overly loud and noisy rooms, and rooms with motion or flickering lights. Try eating meals in a well-ventilated area that is free of strong smells.  Quick and sudden changes in your movement.  Taking iron pills and multivitamins that contain iron. If you take prescription iron pills, do not stop taking them unless your health care provider approves.  Preparing food. The smell of food can spoil your appetite or trigger nausea.  To help relieve your symptoms:  Listen to your body. Everyone is different and has different preferences. Find what works best for you.  Eat and drink slowly.  Eat 5-6 small meals daily instead of 3 large meals. Eating small meals and snacks can help you avoid an empty stomach.  In the morning, before getting out of bed, eat a couple of crackers to avoid moving around on an empty stomach.  Try eating starchy foods as these are usually tolerated well. Examples include cereal, toast, bread, potatoes, pasta, rice, and pretzels.  Include at   least 1 serving of protein with your meals and snacks. Protein options include lean meats, poultry, seafood, beans, nuts, nut butters, eggs, cheese, and yogurt.  Try eating a protein-rich snack before bed. Examples of a protein-rick snack include cheese and crackers or a peanut butter sandwich made with 1 slice of whole-wheat bread and 1 tsp (5 g) of peanut butter.  Eat or suck on things that have ginger in them. It may help relieve nausea. Add  tsp ground ginger to hot tea or  choose ginger tea.  Try drinking 100% fruit juice or an electrolyte drink. An electrolyte drink contains sodium, potassium, and chloride.  Drink fluids that are cold, clear, and carbonated or sour. Examples include lemonade, ginger ale, lemon-lime soda, ice water, and sparkling water.  Brush your teeth or use a mouth rinse after meals.  Talk with your health care provider about starting a supplement of vitamin B6.  General instructions  Take over-the-counter and prescription medicines only as told by your health care provider.  Follow instructions from your health care provider about eating or drinking restrictions.  Continue to take your prenatal vitamins as told by your health care provider. If you are having trouble taking your prenatal vitamins, talk with your health care provider about different options.  Keep all follow-up and pre-birth (prenatal) visits as told by your health care provider. This is important.  Contact a health care provider if:  You have pain in your abdomen.  You have a severe headache.  You have vision problems.  You are losing weight.  You feel weak or dizzy.  Get help right away if:  You cannot drink fluids without vomiting.  You vomit blood.  You have constant nausea and vomiting.  You are very weak.  You faint.  You have a fever and your symptoms suddenly get worse.  Summary  Hyperemesis gravidarum is a severe form of nausea and vomiting that happens during pregnancy.  Making some changes to your eating habits may help relieve nausea and vomiting.  This condition may be managed with medicine.  If medicines do not help relieve nausea and vomiting, you may need to receive fluids through an IV at the hospital.  This information is not intended to replace advice given to you by your health care provider. Make sure you discuss any questions you have with your health care provider.  Document Released: 09/19/2005 Document Revised: 10/09/2017 Document Reviewed: 05/18/2016  [employer] Interactive  Patient Education  2019 [employer]

## 2019-01-18 ENCOUNTER — Other Ambulatory Visit: Payer: Self-pay | Admitting: Obstetrics and Gynecology

## 2019-01-22 ENCOUNTER — Other Ambulatory Visit: Payer: Self-pay | Admitting: Obstetrics and Gynecology

## 2019-01-23 ENCOUNTER — Other Ambulatory Visit: Payer: Self-pay | Admitting: Obstetrics and Gynecology

## 2020-01-07 ENCOUNTER — Emergency Department (HOSPITAL_COMMUNITY)
Admission: EM | Admit: 2020-01-07 | Discharge: 2020-01-07 | Disposition: A | Discharge status: Home / Self Care | Payer: Self-pay | Attending: Emergency Medicine | Admitting: Emergency Medicine

## 2020-01-07 ENCOUNTER — Other Ambulatory Visit: Payer: Self-pay

## 2020-01-07 DIAGNOSIS — Z5321 Procedure and treatment not carried out due to patient leaving prior to being seen by health care provider: Secondary | ICD-10-CM | POA: Insufficient documentation

## 2020-01-07 DIAGNOSIS — O219 Vomiting of pregnancy, unspecified: Secondary | ICD-10-CM | POA: Insufficient documentation

## 2020-01-07 DIAGNOSIS — Z3A00 Weeks of gestation of pregnancy not specified: Secondary | ICD-10-CM | POA: Insufficient documentation

## 2020-01-07 NOTE — ED Notes (Signed)
Before triage could be completed. Patient states that she is too bad off to stay here. She was encouraged to stay, but stated that she was going to go get something to eat because being here was making her worse.

## 2020-01-07 NOTE — ED Notes (Signed)
During triage, pt made staff aware she could not sit and wait because she was "so bad off" and "waiting was making it worse". Pt encouraged to stay in ED to be seen but pt states "I am going to get something to eat because being here is making it worse". Pt ambulatory out of ED.

## 2020-01-07 NOTE — ED Triage Notes (Signed)
Pt reports nausea and emesis starting yesterday. She states that she is coughing up blood. Pt coughed up clear saliva in triage. She also states that she is pregnant, but doesn't know how far along.

## 2020-01-08 ENCOUNTER — Other Ambulatory Visit (HOSPITAL_COMMUNITY): Payer: PRIVATE HEALTH INSURANCE

## 2020-01-08 ENCOUNTER — Emergency Department (HOSPITAL_COMMUNITY): Payer: PRIVATE HEALTH INSURANCE

## 2020-01-08 ENCOUNTER — Other Ambulatory Visit: Payer: Self-pay

## 2020-01-08 ENCOUNTER — Observation Stay (HOSPITAL_COMMUNITY)
Admission: EM | Admit: 2020-01-08 | Discharge: 2020-01-09 | Discharge status: Against Medical Advice | Payer: PRIVATE HEALTH INSURANCE | Attending: Internal Medicine | Admitting: Internal Medicine

## 2020-01-08 ENCOUNTER — Encounter (HOSPITAL_COMMUNITY): Payer: Self-pay

## 2020-01-08 DIAGNOSIS — O210 Mild hyperemesis gravidarum: Secondary | ICD-10-CM

## 2020-01-08 DIAGNOSIS — Z79899 Other long term (current) drug therapy: Secondary | ICD-10-CM | POA: Insufficient documentation

## 2020-01-08 DIAGNOSIS — D72829 Elevated white blood cell count, unspecified: Secondary | ICD-10-CM | POA: Diagnosis present

## 2020-01-08 DIAGNOSIS — R1011 Right upper quadrant pain: Secondary | ICD-10-CM

## 2020-01-08 DIAGNOSIS — Z3A10 10 weeks gestation of pregnancy: Secondary | ICD-10-CM | POA: Insufficient documentation

## 2020-01-08 DIAGNOSIS — Z8249 Family history of ischemic heart disease and other diseases of the circulatory system: Secondary | ICD-10-CM | POA: Insufficient documentation

## 2020-01-08 DIAGNOSIS — Z87891 Personal history of nicotine dependence: Secondary | ICD-10-CM | POA: Insufficient documentation

## 2020-01-08 DIAGNOSIS — Z3A01 Less than 8 weeks gestation of pregnancy: Secondary | ICD-10-CM

## 2020-01-08 DIAGNOSIS — Z20822 Contact with and (suspected) exposure to covid-19: Secondary | ICD-10-CM | POA: Insufficient documentation

## 2020-01-08 DIAGNOSIS — R112 Nausea with vomiting, unspecified: Secondary | ICD-10-CM

## 2020-01-08 DIAGNOSIS — O26891 Other specified pregnancy related conditions, first trimester: Principal | ICD-10-CM | POA: Insufficient documentation

## 2020-01-08 DIAGNOSIS — O99111 Other diseases of the blood and blood-forming organs and certain disorders involving the immune mechanism complicating pregnancy, first trimester: Secondary | ICD-10-CM | POA: Insufficient documentation

## 2020-01-08 HISTORY — DX: Mild hyperemesis gravidarum: O21.0

## 2020-01-08 LAB — CBC WITH DIFFERENTIAL/PLATELET
Abs Immature Granulocytes: 0.13 10*3/uL — ABNORMAL HIGH (ref 0.00–0.07)
Basophils Absolute: 0 10*3/uL (ref 0.0–0.1)
Basophils Relative: 0 %
Eosinophils Absolute: 0 10*3/uL (ref 0.0–0.5)
Eosinophils Relative: 0 %
HCT: 35.5 % — ABNORMAL LOW (ref 36.0–46.0)
Hemoglobin: 12.7 g/dL (ref 12.0–15.0)
Immature Granulocytes: 1 %
Lymphocytes Relative: 5 %
Lymphs Abs: 1.2 10*3/uL (ref 0.7–4.0)
MCH: 32.4 pg (ref 26.0–34.0)
MCHC: 35.8 g/dL (ref 30.0–36.0)
MCV: 90.6 fL (ref 80.0–100.0)
Monocytes Absolute: 0.8 10*3/uL (ref 0.1–1.0)
Monocytes Relative: 3 %
Neutro Abs: 22.3 10*3/uL — ABNORMAL HIGH (ref 1.7–7.7)
Neutrophils Relative %: 91 %
Platelets: 361 10*3/uL (ref 150–400)
RBC: 3.92 MIL/uL (ref 3.87–5.11)
RDW: 12 % (ref 11.5–15.5)
WBC: 24.5 10*3/uL — ABNORMAL HIGH (ref 4.0–10.5)
nRBC: 0 % (ref 0.0–0.2)

## 2020-01-08 LAB — COMPREHENSIVE METABOLIC PANEL
ALT: 41 U/L (ref 0–44)
AST: 30 U/L (ref 15–41)
Albumin: 4.2 g/dL (ref 3.5–5.0)
Alkaline Phosphatase: 51 U/L (ref 38–126)
Anion gap: 11 (ref 5–15)
BUN: 13 mg/dL (ref 6–20)
CO2: 24 mmol/L (ref 22–32)
Calcium: 9.8 mg/dL (ref 8.9–10.3)
Chloride: 103 mmol/L (ref 98–111)
Creatinine, Ser: 0.79 mg/dL (ref 0.44–1.00)
GFR calc Af Amer: >60 mL/min (ref >60)
GFR calc non Af Amer: >60 mL/min (ref >60)
Glucose, Bld: 115 mg/dL — ABNORMAL HIGH (ref 70–99)
Potassium: 3.6 mmol/L (ref 3.5–5.1)
Sodium: 138 mmol/L (ref 135–145)
Total Bilirubin: 0.5 mg/dL (ref 0.3–1.2)
Total Protein: 7.3 g/dL (ref 6.5–8.1)

## 2020-01-08 LAB — TYPE AND SCREEN
ABO/RH(D): O POS
Antibody Screen: NEGATIVE

## 2020-01-08 LAB — LIPASE, BLOOD: Lipase: 20 U/L (ref 11–51)

## 2020-01-08 LAB — ABO/RH: ABO/RH(D): O POS

## 2020-01-08 LAB — HCG, QUANTITATIVE, PREGNANCY: hCG, Beta Chain, Quant, S: 148161 m[IU]/mL — ABNORMAL HIGH

## 2020-01-08 LAB — SARS CORONAVIRUS 2 (TAT 6-24 HRS): SARS Coronavirus 2: NEGATIVE

## 2020-01-08 MED ORDER — POTASSIUM CHLORIDE IN NACL 20-0.9 MEQ/L-% IV SOLN
INTRAVENOUS | Status: DC
  Filled 2020-01-08 (×2): qty 1000

## 2020-01-08 MED ORDER — ONDANSETRON HCL 4 MG/2ML IJ SOLN
4.0000 mg | Freq: Once | INTRAMUSCULAR | Status: AC
  Administered 2020-01-08: 4 mg via INTRAVENOUS
  Filled 2020-01-08: qty 2

## 2020-01-08 MED ORDER — SODIUM CHLORIDE 0.9 % IV BOLUS
1000.0000 mL | Freq: Once | INTRAVENOUS | Status: AC
  Administered 2020-01-08: 1000 mL via INTRAVENOUS

## 2020-01-08 MED ORDER — STERILE WATER FOR INJECTION IJ SOLN
INTRAMUSCULAR | Status: AC
  Administered 2020-01-08: 10 mL
  Filled 2020-01-08: qty 10

## 2020-01-08 MED ORDER — ONDANSETRON HCL 4 MG/2ML IJ SOLN: 4.0000 mg | Freq: Four times a day (QID) | INTRAMUSCULAR | Status: DC | PRN

## 2020-01-08 MED ORDER — PROMETHAZINE HCL 25 MG/ML IJ SOLN
12.5000 mg | INTRAMUSCULAR | Status: DC | PRN
  Administered 2020-01-08 – 2020-01-09 (×3): 12.5 mg via INTRAVENOUS
  Filled 2020-01-08 (×4): qty 1

## 2020-01-08 MED ORDER — ONDANSETRON HCL 4 MG PO TABS: 4.0000 mg | ORAL_TABLET | Freq: Four times a day (QID) | ORAL | Status: DC | PRN

## 2020-01-08 MED ORDER — VITAMIN B-6 50 MG PO TABS
50.0000 mg | ORAL_TABLET | Freq: Every day | ORAL | Status: DC
  Filled 2020-01-08: qty 1

## 2020-01-08 MED ORDER — ACETAMINOPHEN 325 MG PO TABS: 650.0000 mg | ORAL_TABLET | Freq: Four times a day (QID) | ORAL | Status: DC | PRN

## 2020-01-08 MED ORDER — ACETAMINOPHEN 650 MG RE SUPP: 650.0000 mg | Freq: Four times a day (QID) | RECTAL | Status: DC | PRN

## 2020-01-08 MED ORDER — PROMETHAZINE HCL 25 MG/ML IJ SOLN: 25.0000 mg | Freq: Once | INTRAMUSCULAR | Status: DC

## 2020-01-08 MED ORDER — PROMETHAZINE HCL 25 MG/ML IJ SOLN
25.0000 mg | Freq: Once | INTRAMUSCULAR | Status: AC
  Administered 2020-01-08: 25 mg via INTRAVENOUS
  Filled 2020-01-08: qty 1

## 2020-01-08 MED ORDER — SODIUM CHLORIDE 0.9 % IV BOLUS
1000.0000 mL | Freq: Once | INTRAVENOUS | Status: AC
  Administered 2020-01-08: 10:00:00 1000 mL via INTRAVENOUS

## 2020-01-08 MED ORDER — PANTOPRAZOLE SODIUM 40 MG IV SOLR
40.0000 mg | Freq: Once | INTRAVENOUS | Status: AC
  Administered 2020-01-08: 40 mg via INTRAVENOUS
  Filled 2020-01-08: qty 40

## 2020-01-08 MED ORDER — ONDANSETRON 8 MG PO TBDP
8.0000 mg | ORAL_TABLET | Freq: Once | ORAL | Status: AC
  Administered 2020-01-08: 8 mg via ORAL
  Filled 2020-01-08: qty 1

## 2020-01-08 NOTE — ED Notes (Signed)
NS KCL 20 sent up with patient. Has not been started yet.

## 2020-01-08 NOTE — ED Notes (Signed)
Ultrasound at bedside

## 2020-01-08 NOTE — H&P (Signed)
History and Physical    Rhonda [name] OYD:[id number] DOB: April 24, 1991 DOA: 01/08/2020  PCP: Patient, No Pcp Per   Patient coming from: Home.  I have personally briefly reviewed patient's old medical records in [facility]  Chief Complaint: Vomiting.  HPI: Rhonda [name] is a 29 y.o. female with no significant past medical history who is [redacted] weeks pregnant who came into the ER with epigastric abdominal pain due to having multiple episodes of nausea and emesis for the past 2 days.  She denies diarrhea, constipation, melena or hematochezia.  No dysuria, frequency or hematuria.  She denies fever, but complains of fatigue and chills.  No headache, sore throat, rhinorrhea, dyspnea, wheezing or hemoptysis.  Denies chest pain, palpitations, dizziness, diaphoresis, PND, orthopnea or pitting edema of the lower extremities.  No polyuria, polydipsia, polyphagia or blurred vision.  ED Course: Initial vital signs were temperature 98.3 F, pulse 68, respirations 16, blood pressure 113/73 mmHg O2 sat 95% on room air.  Patient was given pantoprazole 40 mg IVP, promethazine 25 mg IVPB and 2000 mL of NS bolus.  White count was 24.5, hemoglobin 12.7 g/dL and platelets 672.  Lipase was normal.  CMP showed a glucose of 150 mg/dL, but all other values are within normal range.  Abdominal and obstetric ultrasound imaging did not show any acute abnormalities.  Please see images and full evaluation for for further detail.  Review of Systems: As per HPI otherwise 10 point review of systems negative.   Past Medical History:  Diagnosis Date  . Medical history non-contributory     Past Surgical History:  Procedure Laterality Date  . TONSILLECTOMY    . wisdom teeth       reports that she quit smoking about 7 years ago. Her smoking use included cigarettes. She has never used smokeless tobacco. She reports previous alcohol use. She reports previous drug use.  No Known Allergies  Family History  Problem Relation Age  of Onset  . Hypertension Other    Prior to Admission medications   Medication Sig Start Date End Date Taking? Authorizing Provider  Prenatal Vit-Fe Fumarate-FA (PRENATAL PO) Take 1 tablet by mouth daily.   Yes [provider]  famotidine (PEPCID) 20 MG tablet Take 1 tablet (20 mg total) by mouth 2 (two) times daily. Patient not taking: Reported on 01/08/2020 01/15/19   Gerrit Heck, CNM  glycopyrrolate (ROBINUL) 1 MG tablet Take 1 tablet (1 mg total) by mouth 3 (three) times daily. Patient not taking: Reported on 01/08/2020 01/14/19   Sharyon Cable, CNM  promethazine (PHENERGAN) 25 MG suppository Place 1 suppository (25 mg total) rectally every 6 (six) hours as needed for nausea or vomiting. Patient not taking: Reported on 01/08/2020 01/15/19   Gerrit Heck, CNM  pyridOXINE (VITAMIN B-6) 50 MG tablet Take 1 tablet (50 mg total) by mouth every 12 (twelve) hours. Patient not taking: Reported on 01/08/2020 01/17/19   Aviva Signs, CNM  scopolamine (TRANSDERM-SCOP) 1 MG/3DAYS Place 1 patch (1.5 mg total) onto the skin every 3 (three) days. Patient not taking: Reported on 01/08/2020 01/15/19   Gerrit Heck, CNM    Physical Exam: Vitals:   01/08/20 1015 01/08/20 1030 01/08/20 1200 01/08/20 1556  BP: 112/72 96/61 107/66 117/73  Pulse:  71 62 74  Resp:  14 14 15   Temp:      TempSrc:      SpO2:  100% 100% 100%  Weight:      Height:  Constitutional: NAD, calm, comfortable Eyes: PERRL, lids and conjunctivae normal ENMT: Mucous membranes are dry. Posterior pharynx clear of any exudate or lesions. Neck: normal, supple, no masses, no thyromegaly Respiratory: clear to auscultation bilaterally, no wheezing, no crackles. Normal respiratory effort. No accessory muscle use.  Cardiovascular: Regular rate and rhythm, no murmurs / rubs / gallops. No extremity edema. 2+ pedal pulses. No carotid bruits.  Abdomen: Gravid, nondistended.  BS positive.  Soft, positive epigastric tenderness, no  masses palpated. No hepatosplenomegaly. Musculoskeletal: no clubbing / cyanosis. Good ROM, no contractures. Normal muscle tone.  Skin: no rashes, lesions, ulcers on limited dermatological examination. Neurologic: CN 2-12 grossly intact. Sensation intact, DTR normal. Strength 5/5 in all 4.  Psychiatric: Normal judgment and insight. Alert and oriented x 3. Normal mood.   Labs on Admission: I have personally reviewed following labs and imaging studies  CBC: Recent Labs  Lab 01/08/20 0930  WBC 24.5*  NEUTROABS 22.3*  HGB 12.7  HCT 35.5*  MCV 90.6  PLT 361   Basic Metabolic Panel: Recent Labs  Lab 01/08/20 0930  NA 138  K 3.6  CL 103  CO2 24  GLUCOSE 115*  BUN 13  CREATININE 0.79  CALCIUM 9.8   GFR: Estimated Creatinine Clearance: 95 mL/min (by C-G formula based on SCr of 0.79 mg/dL). Liver Function Tests: Recent Labs  Lab 01/08/20 0930  AST 30  ALT 41  ALKPHOS 51  BILITOT 0.5  PROT 7.3  ALBUMIN 4.2   Recent Labs  Lab 01/08/20 0930  LIPASE 20   No results for input(s): AMMONIA in the last 168 hours. Coagulation Profile: No results for input(s): INR, PROTIME in the last 168 hours. Cardiac Enzymes: No results for input(s): CKTOTAL, CKMB, CKMBINDEX, TROPONINI in the last 168 hours. BNP (last 3 results) No results for input(s): PROBNP in the last 8760 hours. HbA1C: No results for input(s): HGBA1C in the last 72 hours. CBG: No results for input(s): GLUCAP in the last 168 hours. Lipid Profile: No results for input(s): CHOL, HDL, LDLCALC, TRIG, CHOLHDL, LDLDIRECT in the last 72 hours. Thyroid Function Tests: No results for input(s): TSH, T4TOTAL, FREET4, T3FREE, THYROIDAB in the last 72 hours. Anemia Panel: No results for input(s): VITAMINB12, FOLATE, FERRITIN, TIBC, IRON, RETICCTPCT in the last 72 hours. Urine analysis:    Component Value Date/Time   COLORURINE YELLOW 01/16/2019 2318   APPEARANCEUR CLEAR 01/16/2019 2318   LABSPEC 1.018 01/16/2019 2318    PHURINE 6.0 01/16/2019 2318   GLUCOSEU NEGATIVE 01/16/2019 2318   HGBUR NEGATIVE 01/16/2019 2318   BILIRUBINUR NEGATIVE 01/16/2019 2318   KETONESUR 80 (A) 01/16/2019 2318   PROTEINUR NEGATIVE 01/16/2019 2318   UROBILINOGEN 0.2 09/12/2018 1516   NITRITE NEGATIVE 01/16/2019 2318   LEUKOCYTESUR NEGATIVE 01/16/2019 2318    Radiological Exams on Admission: US OB Comp < 14 Wks  Result Date: 01/08/2020 CLINICAL DATA:  Nausea and vomiting.  Pregnant. EXAM: OBSTETRIC <14 WK [name] AND TRANSVAGINAL OB US TECHNIQUE: Both transabdominal and transvaginal ultrasound examinations were performed for complete evaluation of the gestation as well as the maternal uterus, adnexal regions, and pelvic cul-de-sac. Transvaginal technique was performed to assess early pregnancy. COMPARISON:  None. FINDINGS: Intrauterine gestational sac: Present Yolk sac:  Present Embryo:  Present Cardiac Activity: Breast Heart Rate: 168 bpm CRL:  12.2 mm   7 w   3 d                  [name] EDC: 08/23/2020 Subchorionic hemorrhage:  None visualized.  Maternal uterus/adnexae: Ovaries not visualized. Patient unable to tolerate the transvaginal examination. No free pelvic fluid. IMPRESSION: 1. Single low living intrauterine embryo estimated at 7 weeks and 3 days gestation. 2. No subchorionic hemorrhage identified. 3. Ovaries not visualized. 4. No free pelvic fluid collections. Electronically Signed   By: Marijo Sanes M.D.   On: 01/08/2020 12:38   US OB Transvaginal  Result Date: 01/08/2020 CLINICAL DATA:  Nausea and vomiting.  Pregnant. EXAM: OBSTETRIC <14 WK [name] AND TRANSVAGINAL OB US TECHNIQUE: Both transabdominal and transvaginal ultrasound examinations were performed for complete evaluation of the gestation as well as the maternal uterus, adnexal regions, and pelvic cul-de-sac. Transvaginal technique was performed to assess early pregnancy. COMPARISON:  None. FINDINGS: Intrauterine gestational sac: Present Yolk sac:  Present Embryo:  Present Cardiac  Activity: Breast Heart Rate: 168 bpm CRL:  12.2 mm   7 w   3 d                  [name] EDC: 08/23/2020 Subchorionic hemorrhage:  None visualized. Maternal uterus/adnexae: Ovaries not visualized. Patient unable to tolerate the transvaginal examination. No free pelvic fluid. IMPRESSION: 1. Single low living intrauterine embryo estimated at 7 weeks and 3 days gestation. 2. No subchorionic hemorrhage identified. 3. Ovaries not visualized. 4. No free pelvic fluid collections. Electronically Signed   By: Marijo Sanes M.D.   On: 01/08/2020 12:38   US Abdomen Limited RUQ  Result Date: 01/08/2020 CLINICAL DATA:  Vomiting, nausea and abdominal pain in a patient who is [redacted] weeks pregnant. EXAM: ULTRASOUND ABDOMEN LIMITED RIGHT UPPER QUADRANT COMPARISON:  None. FINDINGS: Gallbladder: No gallstones or wall thickening visualized. No sonographic Murphy sign noted by sonographer. Common bile duct: Diameter: 0.2 cm. Liver: No focal lesion identified. Within normal limits in parenchymal echogenicity. Portal vein is patent on color Doppler imaging with normal direction of blood flow towards the liver. Other: None. IMPRESSION: Negative for gallstones.  Normal exam. Electronically Signed   By: Inge Rise M.D.   On: 01/08/2020 14:19    EKG: Independently reviewed.  Assessment/Plan Principal Problem:   Hyperemesis gravidarum Observation/MedSurg. Keep n.p.o. for now. Advance to clear liquids when nausea subsides. Continue IVF hydration. Antiemetics as needed. Resume vitamin B6 at bedtime.  Active Problems:   Leukocytosis No fever. Continue IV fluids. Monitor WBC.   DVT prophylaxis: SCDs. Code Status: Full code. Family Communication: Disposition Plan: Observation for IV hydration and nausea management. Consults called: Admission status: Observation/MedSurg.   Reubin Milan MD [facility]  If 7PM-7AM, please contact night-coverage [email or web address]  01/08/2020, 5:33 PM   This document was  prepared using Dragon voice recognition software and may contain some unintended transcription errors.

## 2020-01-08 NOTE — ED Provider Notes (Signed)
[facility] DEPT Provider Note   CSN: [id number] Arrival date & time: 01/08/20  3016     History No chief complaint on file.   Rhonda [name] is a 29 y.o. female.  HPI      29 year old female, approximately [redacted] weeks pregnant, presents with nausea and vomiting.  She notes some abdominal burning which she states is from "being empty."  She denies any fevers, chills, hematemesis, hematochezia, melena.  She denies any diarrhea.  She denies any lower abdominal pain, vaginal bleeding.   Of note patient was triaged yesterday morning and left without being seen.  Triage note states that she was coughing up blood.  However patient denies that this morning.  Past Medical History:  Diagnosis Date  . Medical history non-contributory     Patient Active Problem List   Diagnosis Date Noted  . Abnormal uterine bleeding 10/11/2018    Past Surgical History:  Procedure Laterality Date  . TONSILLECTOMY    . wisdom teeth       OB History    Gravida  2   Para  0   Term  0   Preterm  0   AB  0   Living  0     SAB  0   TAB  0   Ectopic  0   Multiple  0   Live Births              No family history on file.  Social History   Tobacco Use  . Smoking status: Former Smoker    Types: Cigarettes    Quit date: 09/30/2012    Years since quitting: 7.2  . Smokeless tobacco: Never Used  Substance Use Topics  . Alcohol use: Not Currently  . Drug use: Not Currently    Home Medications Prior to Admission medications   Medication Sig Start Date End Date Taking? Authorizing Provider  famotidine (PEPCID) 20 MG tablet Take 1 tablet (20 mg total) by mouth 2 (two) times daily. 01/15/19   Gavin Pound, CNM  glycopyrrolate (ROBINUL) 1 MG tablet Take 1 tablet (1 mg total) by mouth 3 (three) times daily. 01/14/19   Lajean Manes, CNM  Prenatal Vit-Fe Fumarate-FA (PRENATAL MULTIVITAMIN) TABS tablet Take 1 tablet by mouth daily at 12 noon.     [provider]  promethazine (PHENERGAN) 25 MG suppository Place 1 suppository (25 mg total) rectally every 6 (six) hours as needed for nausea or vomiting. 01/15/19   Gavin Pound, CNM  pyridOXINE (VITAMIN B-6) 50 MG tablet Take 1 tablet (50 mg total) by mouth every 12 (twelve) hours. 01/17/19   Seabron Spates, CNM  scopolamine (TRANSDERM-SCOP) 1 MG/3DAYS Place 1 patch (1.5 mg total) onto the skin every 3 (three) days. 01/15/19   Gavin Pound, CNM    Allergies    Patient has no known allergies.  Review of Systems   Review of Systems  Constitutional: Negative for chills and fever.  Respiratory: Negative for shortness of breath.   Cardiovascular: Negative for chest pain.  Gastrointestinal: Positive for abdominal pain, nausea and vomiting.  Genitourinary: Negative for dysuria, vaginal bleeding and vaginal discharge.  All other systems reviewed and are negative.   Physical Exam Updated Vital Signs BP 103/73 (BP Location: Right Arm)   Pulse 68   Temp 98.3 F (36.8 C) (Oral)   Resp 16   Ht 5\' 5"  (1.651 m)   Wt 65.8 kg   SpO2 95%   BMI 24.13 kg/m  Physical Exam Vitals and nursing note reviewed.  Constitutional:      Appearance: She is well-developed.  HENT:     Head: Normocephalic and atraumatic.  Eyes:     Conjunctiva/sclera: Conjunctivae normal.  Cardiovascular:     Rate and Rhythm: Normal rate and regular rhythm.     Heart sounds: Normal heart sounds. No murmur.  Pulmonary:     Effort: Pulmonary effort is normal. No respiratory distress.     Breath sounds: Normal breath sounds. No wheezing or rales.  Abdominal:     General: Bowel sounds are normal. There is no distension.     Palpations: Abdomen is soft.     Tenderness: There is generalized abdominal tenderness. Negative signs include Murphy's sign, McBurney's sign and psoas sign.  Musculoskeletal:        General: No tenderness or deformity. Normal range of motion.     Cervical back: Neck supple.  Skin:     General: Skin is warm and dry.     Findings: No erythema or rash.  Neurological:     Mental Status: She is alert and oriented to person, place, and time.  Psychiatric:        Behavior: Behavior normal.     ED Results / Procedures / Treatments   Labs (all labs ordered are listed, but only abnormal results are displayed) Labs Reviewed  CBC WITH DIFFERENTIAL/PLATELET  COMPREHENSIVE METABOLIC PANEL  LIPASE, BLOOD    EKG None  Radiology No results found.  Procedures Procedures (including critical care time)  Medications Ordered in ED Medications  ondansetron (ZOFRAN-ODT) disintegrating tablet 8 mg (has no administration in time range)  sodium chloride 0.9 % bolus 1,000 mL (has no administration in time range)    ED Course  I have reviewed the triage vital signs and the nursing notes.  Pertinent labs & imaging results that were available during my care of the patient were reviewed by me and considered in my medical decision making (see chart for details).    MDM Rules/Calculators/A&P                      Patient presents with nausea, vomiting in pregnancy.  She also had diffuse abdominal pain.  Vital signs stable.  White count of 24.5.  OB ultrasound confirms intrauterine pregnancy.  No evidence of acute cholecystitis on ultrasound.  Patient notes the symptoms are similar to she had hyperemesis with her last pregnancy.  Patient is been given fluids, Zofran x2 and Phenergan.  She continues to vomit.  She is requesting admission.  Discussed with hospitalist, Dr. Robb Matar, who will admit the patient.  Rhonda [name] was evaluated in Emergency Department on 01/08/2020 for the symptoms described in the history of present illness. She was evaluated in the context of the global COVID-19 pandemic, which necessitated consideration that the patient might be at risk for infection with the SARS-CoV-2 virus that causes COVID-19. Institutional protocols and algorithms that pertain to the evaluation  of patients at risk for COVID-19 are in a state of rapid change based on information released by regulatory bodies including the CDC and federal and state organizations. These policies and algorithms were followed during the patient's care in the ED.    Final Clinical Impression(s) / ED Diagnoses Final diagnoses:  None    Rx / DC Orders ED Discharge Orders    None       Rueben Bash 01/08/20 1546    Milagros Loll, MD 01/13/20  1529  

## 2020-01-08 NOTE — ED Triage Notes (Addendum)
Patient currently [redacted] weeks pregnant, c/o extreme nausea, vomiting, and abdominal pain Was pregnant last year and dx with hyperemesis gravidarum, no living children Has 10/10 upper abdominal pain from throwing up, states it feels like its "burning"  No prenatal care per patient

## 2020-01-09 MED ORDER — CALCIUM CARBONATE ANTACID 500 MG PO CHEW
200.0000 mg | CHEWABLE_TABLET | Freq: Three times a day (TID) | ORAL | Status: DC
  Administered 2020-01-09: 200 mg via ORAL
  Filled 2020-01-09: qty 1

## 2020-01-09 NOTE — Progress Notes (Signed)
Pt has signed a AMA form and has been educated on what this means. Pt stated that she understands. On call has been notified.

## 2021-06-26 ENCOUNTER — Encounter (HOSPITAL_COMMUNITY): Payer: Self-pay | Admitting: Emergency Medicine

## 2021-06-26 ENCOUNTER — Emergency Department (HOSPITAL_COMMUNITY): Payer: BC Managed Care – PPO

## 2021-06-26 ENCOUNTER — Other Ambulatory Visit: Payer: Self-pay

## 2021-06-26 ENCOUNTER — Emergency Department (HOSPITAL_COMMUNITY)
Admission: EM | Admit: 2021-06-26 | Discharge: 2021-06-27 | Disposition: A | Discharge status: Home / Self Care | Payer: BC Managed Care – PPO | Attending: Emergency Medicine | Admitting: Emergency Medicine

## 2021-06-26 DIAGNOSIS — Y92410 Unspecified street and highway as the place of occurrence of the external cause: Secondary | ICD-10-CM | POA: Insufficient documentation

## 2021-06-26 DIAGNOSIS — S161XXA Strain of muscle, fascia and tendon at neck level, initial encounter: Secondary | ICD-10-CM | POA: Diagnosis not present

## 2021-06-26 DIAGNOSIS — S46912A Strain of unspecified muscle, fascia and tendon at shoulder and upper arm level, left arm, initial encounter: Secondary | ICD-10-CM | POA: Diagnosis not present

## 2021-06-26 DIAGNOSIS — M542 Cervicalgia: Secondary | ICD-10-CM | POA: Insufficient documentation

## 2021-06-26 DIAGNOSIS — Z87891 Personal history of nicotine dependence: Secondary | ICD-10-CM | POA: Diagnosis not present

## 2021-06-26 DIAGNOSIS — M25512 Pain in left shoulder: Secondary | ICD-10-CM | POA: Diagnosis not present

## 2021-06-26 DIAGNOSIS — S199XXA Unspecified injury of neck, initial encounter: Secondary | ICD-10-CM | POA: Diagnosis present

## 2021-06-26 LAB — I-STAT BETA HCG BLOOD, ED (MC, WL, AP ONLY): I-stat hCG, quantitative: <5 m[IU]/mL (ref <5)
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
## 2021-06-27 MED ORDER — CYCLOBENZAPRINE HCL 10 MG PO TABS
10.0000 mg | ORAL_TABLET | Freq: Once | ORAL | Status: AC
  Administered 2021-06-27: 10 mg via ORAL
  Filled 2021-06-27: qty 1

## 2021-06-27 MED ORDER — CYCLOBENZAPRINE HCL 10 MG PO TABS: 10.0000 mg | ORAL_TABLET | Freq: Two times a day (BID) | ORAL | 0 refills | Status: DC | PRN

## 2021-06-27 MED ORDER — IBUPROFEN 400 MG PO TABS
600.0000 mg | ORAL_TABLET | Freq: Once | ORAL | Status: AC
  Administered 2021-06-27: 600 mg via ORAL
  Filled 2021-06-27: qty 1

## 2021-06-27 MED ORDER — IBUPROFEN 600 MG PO TABS: 600.0000 mg | ORAL_TABLET | Freq: Four times a day (QID) | ORAL | 0 refills | Status: DC | PRN
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
## 2021-06-28 ENCOUNTER — Other Ambulatory Visit (HOSPITAL_BASED_OUTPATIENT_CLINIC_OR_DEPARTMENT_OTHER): Payer: Self-pay | Admitting: Orthopaedic Surgery

## 2021-06-28 DIAGNOSIS — M25512 Pain in left shoulder: Secondary | ICD-10-CM

## 2021-06-29 ENCOUNTER — Ambulatory Visit (HOSPITAL_BASED_OUTPATIENT_CLINIC_OR_DEPARTMENT_OTHER)
Admission: RE | Admit: 2021-06-29 | Discharge: 2021-06-29 | Disposition: A | Discharge status: Home / Self Care | Payer: BC Managed Care – PPO | Source: Ambulatory Visit | Attending: Orthopaedic Surgery | Admitting: Orthopaedic Surgery

## 2021-06-29 ENCOUNTER — Ambulatory Visit (INDEPENDENT_AMBULATORY_CARE_PROVIDER_SITE_OTHER): Payer: Self-pay | Admitting: Orthopaedic Surgery

## 2021-06-29 ENCOUNTER — Other Ambulatory Visit (HOSPITAL_BASED_OUTPATIENT_CLINIC_OR_DEPARTMENT_OTHER): Payer: Self-pay

## 2021-06-29 ENCOUNTER — Other Ambulatory Visit: Payer: Self-pay

## 2021-06-29 VITALS — Ht 65.0 in | Wt 180.0 lb

## 2021-06-29 DIAGNOSIS — M25312 Other instability, left shoulder: Secondary | ICD-10-CM

## 2021-06-29 DIAGNOSIS — M25512 Pain in left shoulder: Secondary | ICD-10-CM | POA: Diagnosis present

## 2021-06-29 MED ORDER — MELOXICAM 15 MG PO TABS
15.0000 mg | ORAL_TABLET | Freq: Every day | ORAL | 2 refills | Status: DC
  Filled 2021-06-29: qty 30, 30d supply, fill #0
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
## 2021-07-20 ENCOUNTER — Ambulatory Visit (HOSPITAL_BASED_OUTPATIENT_CLINIC_OR_DEPARTMENT_OTHER): Payer: BC Managed Care – PPO | Admitting: Orthopaedic Surgery

## 2021-07-24 ENCOUNTER — Ambulatory Visit (HOSPITAL_BASED_OUTPATIENT_CLINIC_OR_DEPARTMENT_OTHER)
Admission: RE | Admit: 2021-07-24 | Discharge: 2021-07-24 | Disposition: A | Discharge status: Home / Self Care | Payer: BC Managed Care – PPO | Source: Ambulatory Visit | Attending: Orthopaedic Surgery | Admitting: Orthopaedic Surgery

## 2021-07-24 ENCOUNTER — Other Ambulatory Visit: Payer: Self-pay

## 2021-07-24 DIAGNOSIS — M25312 Other instability, left shoulder: Secondary | ICD-10-CM | POA: Diagnosis not present

## 2021-07-26 ENCOUNTER — Other Ambulatory Visit (HOSPITAL_BASED_OUTPATIENT_CLINIC_OR_DEPARTMENT_OTHER): Payer: Self-pay

## 2021-07-27 ENCOUNTER — Ambulatory Visit (INDEPENDENT_AMBULATORY_CARE_PROVIDER_SITE_OTHER): Payer: BC Managed Care – PPO | Admitting: Orthopaedic Surgery

## 2021-07-27 ENCOUNTER — Other Ambulatory Visit: Payer: Self-pay

## 2021-07-27 ENCOUNTER — Other Ambulatory Visit (HOSPITAL_BASED_OUTPATIENT_CLINIC_OR_DEPARTMENT_OTHER): Payer: Self-pay

## 2021-07-27 DIAGNOSIS — S46012A Strain of muscle(s) and tendon(s) of the rotator cuff of left shoulder, initial encounter: Secondary | ICD-10-CM

## 2021-07-27 MED ORDER — METHYLPREDNISOLONE 4 MG PO TBPK
ORAL_TABLET | ORAL | 0 refills | Status: DC
  Filled 2021-07-27: qty 21, 6d supply, fill #0
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
## 2021-07-31 ENCOUNTER — Other Ambulatory Visit: Payer: BC Managed Care – PPO

## 2021-08-09 ENCOUNTER — Other Ambulatory Visit (HOSPITAL_BASED_OUTPATIENT_CLINIC_OR_DEPARTMENT_OTHER): Payer: Self-pay

## 2021-08-11 ENCOUNTER — Ambulatory Visit (HOSPITAL_BASED_OUTPATIENT_CLINIC_OR_DEPARTMENT_OTHER): Payer: Self-pay | Admitting: Physical Therapy

## 2021-08-24 ENCOUNTER — Ambulatory Visit (INDEPENDENT_AMBULATORY_CARE_PROVIDER_SITE_OTHER): Payer: BC Managed Care – PPO | Admitting: Orthopaedic Surgery

## 2021-08-24 ENCOUNTER — Other Ambulatory Visit: Payer: Self-pay

## 2021-08-24 ENCOUNTER — Other Ambulatory Visit (HOSPITAL_BASED_OUTPATIENT_CLINIC_OR_DEPARTMENT_OTHER): Payer: Self-pay | Admitting: Orthopaedic Surgery

## 2021-08-24 ENCOUNTER — Ambulatory Visit (HOSPITAL_BASED_OUTPATIENT_CLINIC_OR_DEPARTMENT_OTHER)
Admission: RE | Admit: 2021-08-24 | Discharge: 2021-08-24 | Disposition: A | Discharge status: Home / Self Care | Payer: BC Managed Care – PPO | Source: Ambulatory Visit | Attending: Orthopaedic Surgery | Admitting: Orthopaedic Surgery

## 2021-08-24 DIAGNOSIS — M25512 Pain in left shoulder: Secondary | ICD-10-CM

## 2021-08-24 DIAGNOSIS — M25532 Pain in left wrist: Secondary | ICD-10-CM

## 2021-08-24 DIAGNOSIS — S46012A Strain of muscle(s) and tendon(s) of the rotator cuff of left shoulder, initial encounter: Secondary | ICD-10-CM | POA: Diagnosis not present

## 2021-08-24 MED ORDER — LIDOCAINE HCL 1 % IJ SOLN
4.0000 mL | INTRAMUSCULAR | Status: AC | PRN
  Administered 2021-08-24: 4 mL

## 2021-08-24 MED ORDER — TRIAMCINOLONE ACETONIDE 40 MG/ML IJ SUSP
80.0000 mg | INTRAMUSCULAR | Status: AC | PRN
  Administered 2021-08-24: 80 mg via INTRA_ARTICULAR
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
## 2021-08-25 ENCOUNTER — Encounter (HOSPITAL_BASED_OUTPATIENT_CLINIC_OR_DEPARTMENT_OTHER): Payer: Self-pay | Admitting: Physical Therapy

## 2021-08-25 ENCOUNTER — Ambulatory Visit (HOSPITAL_BASED_OUTPATIENT_CLINIC_OR_DEPARTMENT_OTHER): Payer: BC Managed Care – PPO | Attending: Orthopaedic Surgery | Admitting: Physical Therapy

## 2021-08-25 DIAGNOSIS — M25512 Pain in left shoulder: Secondary | ICD-10-CM | POA: Diagnosis not present

## 2021-08-25 DIAGNOSIS — M6281 Muscle weakness (generalized): Secondary | ICD-10-CM | POA: Diagnosis present

## 2021-09-01 ENCOUNTER — Other Ambulatory Visit: Payer: Self-pay

## 2021-09-01 ENCOUNTER — Encounter (HOSPITAL_BASED_OUTPATIENT_CLINIC_OR_DEPARTMENT_OTHER): Payer: Self-pay | Admitting: Physical Therapy

## 2021-09-01 ENCOUNTER — Ambulatory Visit (HOSPITAL_BASED_OUTPATIENT_CLINIC_OR_DEPARTMENT_OTHER): Payer: BC Managed Care – PPO | Admitting: Physical Therapy

## 2021-09-01 DIAGNOSIS — M6281 Muscle weakness (generalized): Secondary | ICD-10-CM

## 2021-09-01 DIAGNOSIS — M25512 Pain in left shoulder: Secondary | ICD-10-CM

## 2021-09-01 NOTE — Therapy (Signed)
PHYSICAL THERAPY TREATMENT NOTE   Patient Name: Rhonda [name] MRN: [id number] DOB:04/10/1991, 30 y.o., female Today's Date: 09/01/2021   PT End of Session - 09/01/21 1438     Visit Number 2    Number of Visits 21    Date for PT Re-Evaluation 11/23/21    Authorization Type BCBS    PT Start Time 1435    PT Stop Time 1515    PT Time Calculation (min) 40 min    Activity Tolerance Patient tolerated treatment well    Behavior During Therapy [facility] for tasks assessed/performed              Past Medical History:  Diagnosis Date   Medical history non-contributory    Past Surgical History:  Procedure Laterality Date   TONSILLECTOMY     wisdom teeth     Patient Active Problem List   Diagnosis Date Noted   Hyperemesis gravidarum 01/08/2020   Leukocytosis 01/08/2020   Abnormal uterine bleeding 10/11/2018    PCP: Patient, No Pcp Per (Inactive)  REFERRING PROVIDER: Huel Cote, MD  REFERRING DIAG: S46.012A (ICD-10-CM) - Rotator cuff strain, left, initial encounter   Left shoulder rotator cuff program, particularly assistance with subscapularis strain  THERAPY DIAG:  Acute pain of left shoulder  Muscle weakness (generalized)   ONSET DATE: 06/26/2021  SUBJECTIVE:                                                                                                                                                                                      SUBJECTIVE STATEMENT: Pt states it is getting stronger. She had no pain with HEP. She states the shoulder still feels "tight."   PERTINENT HISTORY: N/A  PAIN:  Are you having pain? Yes VAS scale: 5/10 Pain location: posterior and anterior shoulder, feels deeper Pain orientation: Left  PAIN TYPE: sharp and shooting Pain description: intermittent  Aggravating factors: reaching, carrying/lifting anything heavier, rowing Relieving factors: Tylenol   PATIENT GOALS : Pt she states she wants to get back normal and get back to  "being strong and fit." She wants to go back heavy lifting at the gym.   OBJECTIVE:   DIAGNOSTIC FINDINGS:  IMPRESSION: 1. Focal bone marrow edema within the humeral head anteriorly with possible nondisplaced subchondral fracture. 2. Findings suspicious for a posteroinferior labral tear. 3. Mild supraspinatus tendinosis or strain. No rotator cuff tear. 4. Mild intra-articular biceps tendinosis or strain.  IMPRESSION: No fracture or subluxation of the left shoulder.      TODAY'S TREATMENT:   STM: L UT, infra, supra, ant deltoid, L wrist extensor group  Exercises Table/counter push Up -  2 sets - 10  reps Isometric Shoulder Internal Rotation  GTB-  10 reps - 3 hold Shoulder Internal Rotation with Resistance -  2 sets - 10 reps GTB rowing 3x10  PATIENT EDUCATION: Education details: MOI, diagnosis, prognosis, anatomy, exercise progression, DOMS expectations, muscle firing,  envelope of function, HEP, POC  Person educated: Patient Education method: Explanation, Demonstration, Tactile cues, Verbal cues, and Handouts Education comprehension: verbalized understanding, returned demonstration, verbal cues required, and tactile cues required   HOME EXERCISE PROGRAM: Access Code: [email or web address] URL: [email or web address] Date: 08/25/2021 Prepared by: Zebedee Iba    ASSESSMENT:  CLINICAL IMPRESSION: Pt with improved tolerance to OH ROM following manual therapy at today's session. Pt with report of decreased stiffness following manual therapy and able to progress HEP at this time. Pt able to perform rowing today and progress CKC exercise without pain. Plan to introduce more OH stability and prone exercise at next session. Pt would benefit from continued skilled therapy in order to reach goals and maximize functional L UE strength and ROM for full return to PLOF.   Objective impairments include decreased activity tolerance, decreased ROM, decreased strength, hypomobility,  increased muscle spasms, impaired flexibility, impaired UE functional use, improper body mechanics, postural dysfunction, and pain. These impairments are limiting patient from cleaning, community activity, driving, occupation, laundry, shopping, and exercise/recreation . Personal factors including Time since onset of injury/illness/exacerbation are also affecting patient's functional outcome. Patient will benefit from skilled PT to address above impairments and improve overall function.  REHAB POTENTIAL: Good  CLINICAL DECISION MAKING: Stable/uncomplicated  EVALUATION COMPLEXITY: Low   GOALS:   SHORT TERM GOALS:  STG Name Target Date Goal status  1 Pt will become independent with HEP in order to demonstrate synthesis of PT education.  Baseline:  09/15/2021 INITIAL  2 Pt will report at least 2 pt reduction on NPRS scale for pain in order to demonstrate functional improvement with household activity, self care, and ADL.  Baseline:  10/13/2021 INITIAL  3 Pt will be able to demonstrate normal bilateral UE BHB reaching without pain in order to demonstrate functional improvement in UE function for self-care and house hold duties.   10/13/2021 INITIAL   LONG TERM GOALS:   LTG Name Target Date Goal status  1 Pt  will become independent with final HEP in order to demonstrate synthesis of PT education.  Baseline: 11/24/2021 INITIAL  2 Pt will be able to demonstrate ability to perform push up without pain in order to demonstrate functional improvement in UE function for self-care and house hold duties.  Baseline: 11/24/2021 INITIAL  3 Pt will be able to reach [facility] and carry/hold >10 lbs in order to demonstrate functional improvement in L UE strength for return to PLOF and exercise.  Baseline: 11/24/2021 INITIAL  4 FOTO goal to be set when pt is able to perform outcome measure* 11/24/2021 INITIAL   PLAN: PT FREQUENCY: 1-2x/week  PT DURATION: 12 weeks (likely D/C by 8 weeks)  PLANNED  INTERVENTIONS: Therapeutic exercises, Therapeutic activity, Neuro Muscular re-education, Balance training, Patient/Family education, Joint mobilization, Aquatic Therapy, Dry Needling, Electrical stimulation, Spinal mobilization, Cryotherapy, Moist heat, Taping, Vasopneumatic device, Traction, Ultrasound, Ionotophoresis 4mg /ml Dexamethasone, and Manual therapy  PLAN FOR NEXT SESSION: review HEP, prone I Y T, UBE warm up  PT, DPT 09/01/21 3:12 PM

## 2021-09-17 ENCOUNTER — Ambulatory Visit (HOSPITAL_BASED_OUTPATIENT_CLINIC_OR_DEPARTMENT_OTHER): Payer: BC Managed Care – PPO | Attending: Orthopaedic Surgery | Admitting: Physical Therapy

## 2021-09-17 ENCOUNTER — Other Ambulatory Visit: Payer: Self-pay

## 2021-09-17 ENCOUNTER — Encounter (HOSPITAL_BASED_OUTPATIENT_CLINIC_OR_DEPARTMENT_OTHER): Payer: Self-pay | Admitting: Physical Therapy

## 2021-09-17 DIAGNOSIS — M6281 Muscle weakness (generalized): Secondary | ICD-10-CM | POA: Diagnosis present

## 2021-09-17 DIAGNOSIS — M25512 Pain in left shoulder: Secondary | ICD-10-CM | POA: Insufficient documentation

## 2021-09-17 NOTE — Therapy (Signed)
PHYSICAL THERAPY TREATMENT NOTE   Patient Name: Rhonda [name] MRN: [id number] DOB:[birth year], 30 y.o., female Today's Date: 09/17/2021   PT End of Session - 09/17/21 0901     Visit Number 3    Number of Visits 21    Date for PT Re-Evaluation 11/23/21    Authorization Type BCBS    PT Start Time 0900   pt arrives late   PT Stop Time 0930    PT Time Calculation (min) 30 min    Activity Tolerance Patient tolerated treatment well    Behavior During Therapy [facility] for tasks assessed/performed              Past Medical History:  Diagnosis Date   Medical history non-contributory    Past Surgical History:  Procedure Laterality Date   TONSILLECTOMY     wisdom teeth     Patient Active Problem List   Diagnosis Date Noted   Hyperemesis gravidarum 01/08/2020   Leukocytosis 01/08/2020   Abnormal uterine bleeding 10/11/2018    PCP: Patient, No Pcp Per (Inactive)  REFERRING PROVIDER: Huel Cote, MD  REFERRING DIAG: S46.012A (ICD-10-CM) - Rotator cuff strain, left, initial encounter   Left shoulder rotator cuff program, particularly assistance with subscapularis strain  THERAPY DIAG:  Acute pain of left shoulder  Muscle weakness (generalized)   ONSET DATE: 06/26/2021  SUBJECTIVE:                                                                                                                                                                                      SUBJECTIVE STATEMENT: Pt states it is getting better. She states the shoulder feels like soreness in the shoulder- feels like a workout.   PERTINENT HISTORY: N/A  PAIN:  Are you having pain? Yes VAS scale: 4/10 Pain location: posterior and anterior shoulder, feels deeper Pain orientation: Left  PAIN TYPE: sharp and shooting Pain description: intermittent  Aggravating factors: reaching, carrying/lifting anything heavier, rowing Relieving factors: Tylenol   PATIENT GOALS : Pt she states she wants to get  back normal and get back to "being strong and fit." She wants to go back heavy lifting at the gym.   OBJECTIVE:   DIAGNOSTIC FINDINGS:  IMPRESSION: 1. Focal bone marrow edema within the humeral head anteriorly with possible nondisplaced subchondral fracture. 2. Findings suspicious for a posteroinferior labral tear. 3. Mild supraspinatus tendinosis or strain. No rotator cuff tear. 4. Mild intra-articular biceps tendinosis or strain.  IMPRESSION: No fracture or subluxation of the left shoulder.      TODAY'S TREATMENT:   UBE L1 5 min retro  Exercises  Cable rowing  25lbs 10x 40lbs 2x10 Lat pull  down 3x10 45lbs  Kneeling push ups 10x 3lb DB scaption and OHP neutral 2x10   IR strap stretch 10s 10x  PATIENT EDUCATION: Education details: MOI, diagnosis, prognosis, anatomy, exercise progression, DOMS expectations, muscle firing,  envelope of function, HEP, POC  Person educated: Patient Education method: Explanation, Demonstration, Tactile cues, Verbal cues, and Handouts Education comprehension: verbalized understanding, returned demonstration, verbal cues required, and tactile cues required   HOME EXERCISE PROGRAM: Access Code: [email or web address] URL: [email or web address] Date: 08/25/2021 Prepared by: Zebedee Iba    ASSESSMENT:  CLINICAL IMPRESSION: Pt able to progress to gym based exercise today with no pain or discomfort. Pt does have mild anterior shoulder pain with pressing movements so advised to avoid pressing movements in the gym and stay with kneeling push ups for now. Pt able to perform OKC exercise and CKC with increased resistance. OH motion is symmetrical bilaterally. IR reaching is near symmetrical as well. Plan to decrease frequency of visits and testing more gym based movements at next session. Session limited by pt arrival time. Pt would benefit from continued skilled therapy in order to reach goals and maximize functional L UE strength and ROM for full  return to PLOF.   Objective impairments include decreased activity tolerance, decreased ROM, decreased strength, hypomobility, increased muscle spasms, impaired flexibility, impaired UE functional use, improper body mechanics, postural dysfunction, and pain. These impairments are limiting patient from cleaning, community activity, driving, occupation, laundry, shopping, and exercise/recreation . Personal factors including Time since onset of injury/illness/exacerbation are also affecting patient's functional outcome. Patient will benefit from skilled PT to address above impairments and improve overall function.  REHAB POTENTIAL: Good  CLINICAL DECISION MAKING: Stable/uncomplicated  EVALUATION COMPLEXITY: Low   GOALS:   SHORT TERM GOALS:  STG Name Target Date Goal status  1 Pt will become independent with HEP in order to demonstrate synthesis of PT education.  Baseline:  10/01/2021 INITIAL  2 Pt will report at least 2 pt reduction on NPRS scale for pain in order to demonstrate functional improvement with household activity, self care, and ADL.  Baseline:  10/29/2021 INITIAL  3 Pt will be able to demonstrate normal bilateral UE BHB reaching without pain in order to demonstrate functional improvement in UE function for self-care and house hold duties.   10/29/2021 INITIAL   LONG TERM GOALS:   LTG Name Target Date Goal status  1 Pt  will become independent with final HEP in order to demonstrate synthesis of PT education.  Baseline: 12/10/2021 INITIAL  2 Pt will be able to demonstrate ability to perform push up without pain in order to demonstrate functional improvement in UE function for self-care and house hold duties.  Baseline: 12/10/2021 INITIAL  3 Pt will be able to reach [facility] and carry/hold >10 lbs in order to demonstrate functional improvement in L UE strength for return to PLOF and exercise.  Baseline: 12/10/2021 INITIAL  4 FOTO goal to be set when pt is able to perform outcome  measure* 12/10/2021 INITIAL   PLAN: PT FREQUENCY: 1-2x/week  PT DURATION: 12 weeks (likely D/C by 8 weeks)  PLANNED INTERVENTIONS: Therapeutic exercises, Therapeutic activity, Neuro Muscular re-education, Balance training, Patient/Family education, Joint mobilization, Aquatic Therapy, Dry Needling, Electrical stimulation, Spinal mobilization, Cryotherapy, Moist heat, Taping, Vasopneumatic device, Traction, Ultrasound, Ionotophoresis 4mg /ml Dexamethasone, and Manual therapy  PLAN FOR NEXT SESSION: review HEP, prone I PT, DPT 09/17/21 9:35 AM

## 2021-09-22 ENCOUNTER — Encounter (HOSPITAL_BASED_OUTPATIENT_CLINIC_OR_DEPARTMENT_OTHER): Payer: Self-pay | Admitting: Physical Therapy

## 2021-09-29 ENCOUNTER — Encounter (HOSPITAL_BASED_OUTPATIENT_CLINIC_OR_DEPARTMENT_OTHER): Payer: Self-pay | Admitting: Physical Therapy

## 2021-09-29 ENCOUNTER — Ambulatory Visit (HOSPITAL_BASED_OUTPATIENT_CLINIC_OR_DEPARTMENT_OTHER): Payer: BC Managed Care – PPO | Admitting: Physical Therapy

## 2021-09-29 ENCOUNTER — Other Ambulatory Visit: Payer: Self-pay

## 2021-09-29 DIAGNOSIS — M25512 Pain in left shoulder: Secondary | ICD-10-CM | POA: Diagnosis not present

## 2021-09-29 DIAGNOSIS — M6281 Muscle weakness (generalized): Secondary | ICD-10-CM

## 2021-09-29 NOTE — Therapy (Signed)
PHYSICAL THERAPY TREATMENT NOTE   Patient Name: Rhonda [name] MRN: [id number] DOB:28-Oct-1990, 30 y.o., female Today's Date: 09/29/2021   PT End of Session - 09/29/21 1025     Visit Number 4    Number of Visits 21    Date for PT Re-Evaluation 11/23/21    Authorization Type BCBS    PT Start Time 1015    PT Stop Time 1055    PT Time Calculation (min) 40 min    Activity Tolerance Patient tolerated treatment well    Behavior During Therapy [facility] for tasks assessed/performed               Past Medical History:  Diagnosis Date   Medical history non-contributory    Past Surgical History:  Procedure Laterality Date   TONSILLECTOMY     wisdom teeth     Patient Active Problem List   Diagnosis Date Noted   Hyperemesis gravidarum 01/08/2020   Leukocytosis 01/08/2020   Abnormal uterine bleeding 10/11/2018    PCP: Patient, No Pcp Per (Inactive)  REFERRING PROVIDER: Huel Cote, MD  REFERRING DIAG: S46.012A (ICD-10-CM) - Rotator cuff strain, left, initial encounter   Left shoulder rotator cuff program, particularly assistance with subscapularis strain  THERAPY DIAG:  Acute pain of left shoulder  Muscle weakness (generalized)   ONSET DATE: 06/26/2021  SUBJECTIVE:                                                                                                                                                                                      SUBJECTIVE STATEMENT: Pt states it is getting better. She states she might have done to much too much. Pt states she did too many push ups and was sore. It went away after 2 days. She feels she is 75% back to normal. She still not able to lift normally.  2-3x a week in the gym.   PERTINENT HISTORY: N/A  PAIN:  Are you having pain? no VAS scale: 010 Pain location: posterior and anterior shoulder, feels deeper Pain orientation: Left  PAIN TYPE: sharp and shooting Pain description: intermittent  Aggravating factors:  reaching, carrying/lifting anything heavier, rowing Relieving factors: Tylenol   PATIENT GOALS : Pt she states she wants to get back normal and get back to "being strong and fit." She wants to go back heavy lifting at the gym.   OBJECTIVE:   DIAGNOSTIC FINDINGS:  IMPRESSION: 1. Focal bone marrow edema within the humeral head anteriorly with possible nondisplaced subchondral fracture. 2. Findings suspicious for a posteroinferior labral tear. 3. Mild supraspinatus tendinosis or strain. No rotator cuff tear. 4. Mild intra-articular biceps tendinosis or strain.  IMPRESSION: No fracture  or subluxation of the left shoulder.      TODAY'S TREATMENT:   UBE L3 3 min retro and fwd  Exercises  Sidelying ER 3lbs 3x10 Kneeling push ups 2x10 Scapular push up 2x10 in quadruped kneeling plank shoulder taps 20x Stars RTB 2x5     PATIENT EDUCATION: Education details: MOI, diagnosis, prognosis, anatomy, exercise progression, DOMS expectations, muscle firing,  envelope of function, HEP, POC  Person educated: Patient Education method: Explanation, Demonstration, Tactile cues, Verbal cues, and Handouts Education comprehension: verbalized understanding, returned demonstration, verbal cues required, and tactile cues required   HOME EXERCISE PROGRAM: Access Code: [email or web address] URL: [email or web address] Date: 08/25/2021 Prepared by: Zebedee Iba    ASSESSMENT:  CLINICAL IMPRESSION: Pt with good continued tolerance to progression of gym based activity. Pt without pain during session but is limited by muscle endurance and scapular strength at this time. Pt given more advanced scapular stability and cuff based strengthening for home and advised to continue with regular gym program. Pt with ER strength limitation likely causing impaired L UE mechanics in CKC position. Pt likely able to D/C within 2-3 visits if there is no exacerbation in pain.  Pt would benefit from continued skilled  therapy in order to reach goals and maximize functional L UE strength and ROM for full return to PLOF.   Objective impairments include decreased activity tolerance, decreased ROM, decreased strength, hypomobility, increased muscle spasms, impaired flexibility, impaired UE functional use, improper body mechanics, postural dysfunction, and pain. These impairments are limiting patient from cleaning, community activity, driving, occupation, laundry, shopping, and exercise/recreation . Personal factors including Time since onset of injury/illness/exacerbation are also affecting patient's functional outcome. Patient will benefit from skilled PT to address above impairments and improve overall function.  REHAB POTENTIAL: Good  CLINICAL DECISION MAKING: Stable/uncomplicated  EVALUATION COMPLEXITY: Low   GOALS:   SHORT TERM GOALS:  STG Name Target Date Goal status  1 Pt will become independent with HEP in order to demonstrate synthesis of PT education.  Baseline:  10/13/2021 INITIAL  2 Pt will report at least 2 pt reduction on NPRS scale for pain in order to demonstrate functional improvement with household activity, self care, and ADL.  Baseline:  11/10/2021 INITIAL  3 Pt will be able to demonstrate normal bilateral UE BHB reaching without pain in order to demonstrate functional improvement in UE function for self-care and house hold duties.   11/10/2021 INITIAL   LONG TERM GOALS:   LTG Name Target Date Goal status  1 Pt  will become independent with final HEP in order to demonstrate synthesis of PT education.  Baseline: 12/22/2021 INITIAL  2 Pt will be able to demonstrate ability to perform push up without pain in order to demonstrate functional improvement in UE function for self-care and house hold duties.  Baseline: 12/22/2021 INITIAL  3 Pt will be able to reach [facility] and carry/hold >10 lbs in order to demonstrate functional improvement in L UE strength for return to PLOF and  exercise.  Baseline: 12/22/2021 INITIAL  4 FOTO goal to be set when pt is able to perform outcome measure* 12/22/2021 INITIAL   PLAN: PT FREQUENCY: 1-2x/week  PT DURATION: 12 weeks (likely D/C by 8 weeks)  PLANNED INTERVENTIONS: Therapeutic exercises, Therapeutic activity, Neuro Muscular re-education, Balance training, Patient/Family education, Joint mobilization, Aquatic Therapy, Dry Needling, Electrical stimulation, Spinal mobilization, Cryotherapy, Moist heat, Taping, Vasopneumatic device, Traction, Ultrasound, Ionotophoresis 4mg /ml Dexamethasone, and Manual therapy  PLAN FOR NEXT SESSION: review HEP, increase OH strength and scap  stability   Zebedee Iba PT, DPT 09/29/21 10:57 AM

## 2021-10-05 ENCOUNTER — Ambulatory Visit (HOSPITAL_BASED_OUTPATIENT_CLINIC_OR_DEPARTMENT_OTHER): Payer: BC Managed Care – PPO | Admitting: Orthopaedic Surgery

## 2021-10-06 ENCOUNTER — Encounter (HOSPITAL_BASED_OUTPATIENT_CLINIC_OR_DEPARTMENT_OTHER): Payer: Self-pay | Admitting: Physical Therapy

## 2021-10-07 ENCOUNTER — Other Ambulatory Visit: Payer: Self-pay

## 2021-10-07 ENCOUNTER — Ambulatory Visit (INDEPENDENT_AMBULATORY_CARE_PROVIDER_SITE_OTHER): Payer: BC Managed Care – PPO | Admitting: Orthopaedic Surgery

## 2021-10-07 DIAGNOSIS — S46012A Strain of muscle(s) and tendon(s) of the rotator cuff of left shoulder, initial encounter: Secondary | ICD-10-CM
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
## 2021-10-13 ENCOUNTER — Other Ambulatory Visit: Payer: Self-pay

## 2021-10-13 ENCOUNTER — Ambulatory Visit (HOSPITAL_BASED_OUTPATIENT_CLINIC_OR_DEPARTMENT_OTHER): Payer: BC Managed Care – PPO | Attending: Orthopaedic Surgery | Admitting: Physical Therapy

## 2021-10-13 ENCOUNTER — Encounter (HOSPITAL_BASED_OUTPATIENT_CLINIC_OR_DEPARTMENT_OTHER): Payer: Self-pay | Admitting: Physical Therapy

## 2021-10-13 DIAGNOSIS — M25512 Pain in left shoulder: Secondary | ICD-10-CM | POA: Insufficient documentation

## 2021-10-13 DIAGNOSIS — M6281 Muscle weakness (generalized): Secondary | ICD-10-CM | POA: Insufficient documentation

## 2021-10-13 NOTE — Therapy (Signed)
PHYSICAL THERAPY TREATMENT NOTE   Patient Name: Rhonda [name] MRN: [id number] DOB:1991/03/03, 31 y.o., female Today's Date: 10/13/2021   PT End of Session - 10/13/21 1438     Visit Number 5    Number of Visits 21    Date for PT Re-Evaluation 11/23/21    Authorization Type BCBS    PT Start Time 1432    PT Stop Time 1510    PT Time Calculation (min) 38 min    Activity Tolerance Patient tolerated treatment well    Behavior During Therapy [facility] for tasks assessed/performed               Past Medical History:  Diagnosis Date   Medical history non-contributory    Past Surgical History:  Procedure Laterality Date   TONSILLECTOMY     wisdom teeth     Patient Active Problem List   Diagnosis Date Noted   Hyperemesis gravidarum 01/08/2020   Leukocytosis 01/08/2020   Abnormal uterine bleeding 10/11/2018    PCP: Patient, No Pcp Per (Inactive)  REFERRING PROVIDER: [name], MD  REFERRING DIAG: S46.012A (ICD-10-CM) - Rotator cuff strain, left, initial encounter   Left shoulder rotator cuff program, particularly assistance with subscapularis strain  THERAPY DIAG:  Acute pain of left shoulder  Muscle weakness (generalized)   ONSET DATE: 06/26/2021  SUBJECTIVE:                                                                                                                                                                                      SUBJECTIVE STATEMENT: Pt states it is getting better. She is only has pain with BHB reaching at this time. She is trying to work out a few times a week. She did have the flu recently and is getting back to working out.   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   PERTINENT HISTORY: N/A  PAIN:  Are you having pain? no VAS scale: 010 Pain location: posterior and anterior shoulder, feels deeper Pain orientation: Left  PAIN  TYPE: sharp and shooting Pain description: intermittent  Aggravating factors: reaching, carrying/lifting anything heavier, rowing Relieving factors: Tylenol   PATIENT GOALS : Pt she states she wants to get back normal and get back to "being strong and fit." She wants to go back heavy lifting at the gym.   OBJECTIVE:   DIAGNOSTIC FINDINGS:  IMPRESSION: 1. Focal bone marrow edema within the humeral head anteriorly with possible nondisplaced subchondral fracture. 2. Findings suspicious for a posteroinferior labral tear. 3. Mild supraspinatus tendinosis or  strain. No rotator cuff tear. 4. Mild intra-articular biceps tendinosis or strain.  IMPRESSION: No fracture or subluxation of the left shoulder.      TODAY'S TREATMENT:    Exercises  Sidelying ER 3lbs 3x10 90/90 ER and IR RTB 2x10 10 lb OH KB press 2x10 Bent/supported rowing 15lbs 2x10  HEP update and review- gym exercise progression      PATIENT EDUCATION: Education details: exercise progression, DOMS expectations, muscle firing,  envelope of function, HEP, POC  Person educated: Patient Education method: Explanation, Demonstration, Tactile cues, Verbal cues, and Handouts Education comprehension: verbalized understanding, returned demonstration, verbal cues required, and tactile cues required   HOME EXERCISE PROGRAM: Access Code: [email or web address] URL: [email or web address] Date: 08/25/2021 Prepared by: Daleen Bo    ASSESSMENT:  CLINICAL IMPRESSION: Pt with good tolerance to UE 90/90 and OH strength at today's session without pain. HEP has been updated and pt given clearance to begin moderate OH strength workouts at the gym as she is transitioning back after her recent illness. Pt with increased L UE instability with 10lb KB pressing once fatigue has set in but able to perform with good form in clinic today. Plan to see pt for another visit in order to assess OH strength. At this time, pt is likely unable to  safely put 25lbs or more OH while at occupation. Plan to assess for OH stabilty at next session and plan for D/C. Pt likely able to continue with current trajectory of rehab as she is very good about HEP and her own gym based routine.    Pt would benefit from continued skilled therapy in order to reach goals and maximize functional L UE strength and ROM for full return to PLOF.   Objective impairments include decreased activity tolerance, decreased ROM, decreased strength, hypomobility, increased muscle spasms, impaired flexibility, impaired UE functional use, improper body mechanics, postural dysfunction, and pain. These impairments are limiting patient from cleaning, community activity, driving, occupation, laundry, shopping, and exercise/recreation . Personal factors including Time since onset of injury/illness/exacerbation are also affecting patient's functional outcome. Patient will benefit from skilled PT to address above impairments and improve overall function.  REHAB POTENTIAL: Good  CLINICAL DECISION MAKING: Stable/uncomplicated  EVALUATION COMPLEXITY: Low   GOALS:   SHORT TERM GOALS:  STG Name Target Date Goal status  1 Pt will become independent with HEP in order to demonstrate synthesis of PT education.  Baseline:  10/27/2021 MET  2 Pt will report at least 2 pt reduction on NPRS scale for pain in order to demonstrate functional improvement with household activity, self care, and ADL.  Baseline:  11/24/2021 MET  3 Pt will be able to demonstrate normal bilateral UE BHB reaching without pain in order to demonstrate functional improvement in UE function for self-care and house hold duties.   11/24/2021 Partially met   LONG TERM GOALS:   LTG Name Target Date Goal status  1 Pt  will become independent with final HEP in order to demonstrate synthesis of PT education.  Baseline: 01/05/2022 INITIAL  2 Pt will be able to demonstrate ability to perform push up without pain in order  to demonstrate functional improvement in UE function for self-care and house hold duties.  Baseline: 01/05/2022 Partially met  3 Pt will be able to reach [facility] and carry/hold >10 lbs in order to demonstrate functional improvement in L UE strength for return to PLOF and exercise.  Baseline: 01/05/2022 MET  4 FOTO goal to be set when pt is able to  perform outcome measure* 01/05/2022 Unable to assess   PLAN: PT FREQUENCY: 1-2x/week  PT DURATION: 12 weeks (likely D/C by 8 weeks)  PLANNED INTERVENTIONS: Therapeutic exercises, Therapeutic activity, Neuro Muscular re-education, Balance training, Patient/Family education, Joint mobilization, Aquatic Therapy, Dry Needling, Electrical stimulation, Spinal mobilization, Cryotherapy, Moist heat, Taping, Vasopneumatic device, Traction, Ultrasound, Ionotophoresis 33m/ml Dexamethasone, and Manual therapy  PLAN FOR NEXT SESSION: review HEP, increase OH strength and scap stability   ADaleen BoPT, DPT 10/13/21 3:15 PM

## 2021-10-27 ENCOUNTER — Encounter (HOSPITAL_BASED_OUTPATIENT_CLINIC_OR_DEPARTMENT_OTHER): Payer: Self-pay | Admitting: Physical Therapy

## 2021-10-27 ENCOUNTER — Ambulatory Visit (HOSPITAL_BASED_OUTPATIENT_CLINIC_OR_DEPARTMENT_OTHER): Payer: BC Managed Care – PPO | Admitting: Physical Therapy

## 2021-10-27 ENCOUNTER — Other Ambulatory Visit: Payer: Self-pay

## 2021-10-27 DIAGNOSIS — M25512 Pain in left shoulder: Secondary | ICD-10-CM

## 2021-10-27 DIAGNOSIS — M6281 Muscle weakness (generalized): Secondary | ICD-10-CM

## 2021-10-27 NOTE — Therapy (Addendum)
PHYSICAL THERAPY D/C NOTE   Patient Name: Rhonda [name] MRN: [id number] DOB:1991/03/27, 31 y.o., female Today's Date: 10/27/2021   PT End of Session - 10/27/21 0939     Visit Number 6    Number of Visits 21    Date for PT Re-Evaluation 11/23/21    Authorization Type BCBS    PT Start Time 0935    PT Stop Time 1005    PT Time Calculation (min) 30 min    Activity Tolerance Patient tolerated treatment well    Behavior During Therapy [facility] for tasks assessed/performed                Past Medical History:  Diagnosis Date   Medical history non-contributory    Past Surgical History:  Procedure Laterality Date   TONSILLECTOMY     wisdom teeth     Patient Active Problem List   Diagnosis Date Noted   Hyperemesis gravidarum 01/08/2020   Leukocytosis 01/08/2020   Abnormal uterine bleeding 10/11/2018    PCP: Patient, No Pcp Per (Inactive)  REFERRING PROVIDER: [name], MD  REFERRING DIAG: S46.012A (ICD-10-CM) - Rotator cuff strain, left, initial encounter   Left shoulder rotator cuff program, particularly assistance with subscapularis strain  THERAPY DIAG:  Acute pain of left shoulder  Muscle weakness (generalized)   ONSET DATE: 06/26/2021  SUBJECTIVE:                                                                                                                                                                                      SUBJECTIVE STATEMENT: Pt states the shoulder is continuing to do better. She does has not had pain into the shoulder since last session and feels she can manage the strengthening herself at this time for return to work. Pt states she is about 90% at this time.   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   PERTINENT HISTORY: N/A  PAIN:  Are you having pain? no VAS scale: 010 Pain location: posterior and anterior shoulder, feels  deeper Pain orientation: Left  PAIN TYPE: sharp and shooting Pain description: intermittent  Aggravating factors: reaching, carrying/lifting anything heavier, rowing Relieving factors: Tylenol   PATIENT GOALS : Pt she states she wants to get back normal and get back to "being strong and fit." She wants to go back heavy lifting at the gym.   OBJECTIVE:   DIAGNOSTIC FINDINGS:  IMPRESSION: 1. Focal bone marrow edema within the humeral head anteriorly with possible nondisplaced subchondral fracture. 2. Findings suspicious for a posteroinferior labral tear.  3. Mild supraspinatus tendinosis or strain. No rotator cuff tear. 4. Mild intra-articular biceps tendinosis or strain.  IMPRESSION: No fracture or subluxation of the left shoulder.   C/S WNL pain with flexion and ext   A/PROM Right 08/25/2021 Left 08/25/2021  Shoulder flexion [facility]  Shoulder extension [facility]  Shoulder abduction [facility]  Shoulder extension [facility]  Shoulder internal rotation [facility]  Shoulder external rotation [facility] WFL  Elbow flexion WFL WFL  Elbow extension [facility] WFL  Wrist extension WFL WFL  (Blank rows = not tested)   UPPER EXTREMITY MMT:   MMT Right 08/25/2021 Left 08/25/2021  Shoulder flexion 4+/5 4+/5   Shoulder extension      Shoulder abduction 4+/5 4+/5   Shoulder adduction 4+/5 4+/5   Shoulder extension 4+/5 4+/5   (Blank rows = not tested)    TODAY'S TREATMENT:    Exercises L2 3 min fwd and retro  Sidelying ER 3lbs 2x10 3lb OH DB press 2x10  10 lb OH KB press 2x10 44lb KB OH bilat UE lift and place on shelf   HEP review- gym exercise progression      PATIENT EDUCATION: Education details: exercise progression, D/C planning,  HEP, POC  Person educated: Patient Education method: Explanation, Demonstration, Tactile cues, Verbal cues, and Handouts Education comprehension: verbalized understanding, returned demonstration, verbal cues required, and tactile cues  required   HOME EXERCISE PROGRAM: Access Code: [email or web address] URL: [email or web address] Date: 08/25/2021 Prepared by: Daleen Bo    ASSESSMENT:  CLINICAL IMPRESSION: Pt able to demonstrate good OH strength and stability with increased pain at today's session. Given pt's concerns with transportation as well as family related commitments, pt would like to make today last PT appointment. Testing suggests pt is able to independently progress strength for return to work. Pt demos good understanding of self progression as well as good self awareness of deficits. Pt likely able to continue with current trajectory of rehab as she is very good about HEP and her own gym strength routine. D/C this episode of care.     Objective impairments include decreased activity tolerance, decreased ROM, decreased strength, hypomobility, increased muscle spasms, impaired flexibility, impaired UE functional use, improper body mechanics, postural dysfunction, and pain. These impairments are limiting patient from cleaning, community activity, driving, occupation, laundry, shopping, and exercise/recreation . Personal factors including Time since onset of injury/illness/exacerbation are also affecting patient's functional outcome. Patient will benefit from skilled PT to address above impairments and improve overall function.  REHAB POTENTIAL: Good  CLINICAL DECISION MAKING: Stable/uncomplicated  EVALUATION COMPLEXITY: Low   GOALS:   SHORT TERM GOALS:  STG Name Target Date Goal status  1 Pt will become independent with HEP in order to demonstrate synthesis of PT education.  Baseline:  11/10/2021 MET  2 Pt will report at least 2 pt reduction on NPRS scale for pain in order to demonstrate functional improvement with household activity, self care, and ADL.  Baseline:  12/08/2021 MET  3 Pt will be able to demonstrate normal bilateral UE BHB reaching without pain in order to demonstrate functional  improvement in UE function for self-care and house hold duties.   12/08/2021 MET   LONG TERM GOALS:   LTG Name Target Date Goal status  1 Pt  will become independent with final HEP in order to demonstrate synthesis of PT education.  Baseline: 01/19/2022 MET  2 Pt will be able to demonstrate ability to perform push up without pain in order to demonstrate functional improvement  in UE function for self-care and house hold duties.  Baseline: 01/19/2022 MET  3 Pt will be able to reach [facility] and carry/hold >10 lbs in order to demonstrate functional improvement in L UE strength for return to PLOF and exercise.  Baseline: 01/19/2022 MET  4 FOTO goal to be set when pt is able to perform outcome measure* 01/19/2022 Unable to assess; unavailable at eval   PLAN: D/C  Daleen Bo PT, DPT 10/27/21 10:07 AM   PHYSICAL THERAPY DISCHARGE SUMMARY  Visits from Start of Care: 6   Plan: Patient agrees to discharge.  Patient goals were met. Patient is being discharged due to being satisfied with current level of PT progression.

## 2021-11-03 ENCOUNTER — Encounter (HOSPITAL_BASED_OUTPATIENT_CLINIC_OR_DEPARTMENT_OTHER): Payer: BC Managed Care – PPO | Admitting: Physical Therapy

## 2021-11-04 ENCOUNTER — Ambulatory Visit (HOSPITAL_BASED_OUTPATIENT_CLINIC_OR_DEPARTMENT_OTHER): Payer: Self-pay | Admitting: Orthopaedic Surgery

## 2021-11-04 ENCOUNTER — Other Ambulatory Visit: Payer: Self-pay

## 2021-11-04 ENCOUNTER — Ambulatory Visit (INDEPENDENT_AMBULATORY_CARE_PROVIDER_SITE_OTHER): Payer: BC Managed Care – PPO | Admitting: Orthopaedic Surgery

## 2021-11-04 ENCOUNTER — Other Ambulatory Visit (HOSPITAL_BASED_OUTPATIENT_CLINIC_OR_DEPARTMENT_OTHER): Payer: Self-pay

## 2021-11-04 DIAGNOSIS — S46012A Strain of muscle(s) and tendon(s) of the rotator cuff of left shoulder, initial encounter: Secondary | ICD-10-CM | POA: Diagnosis not present

## 2021-11-04 DIAGNOSIS — S43432A Superior glenoid labrum lesion of left shoulder, initial encounter: Secondary | ICD-10-CM

## 2021-11-04 MED ORDER — OXYCODONE HCL 5 MG PO TABS
5.0000 mg | ORAL_TABLET | ORAL | 0 refills | Status: DC | PRN
  Filled 2021-11-04: qty 20, 4d supply, fill #0

## 2021-11-04 MED ORDER — IBUPROFEN 800 MG PO TABS
800.0000 mg | ORAL_TABLET | Freq: Three times a day (TID) | ORAL | 0 refills | Status: AC
  Filled 2021-11-04: qty 30, 10d supply, fill #0

## 2021-11-04 MED ORDER — ACETAMINOPHEN 500 MG PO TABS
500.0000 mg | ORAL_TABLET | Freq: Three times a day (TID) | ORAL | 0 refills | Status: AC
  Filled 2021-11-04: qty 30, 10d supply, fill #0

## 2021-11-04 MED ORDER — ASPIRIN EC 325 MG PO TBEC
325.0000 mg | DELAYED_RELEASE_TABLET | Freq: Every day | ORAL | 0 refills | Status: DC
  Filled 2021-11-04: qty 30, 30d supply, fill #0
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
## 2021-11-05 ENCOUNTER — Encounter (HOSPITAL_BASED_OUTPATIENT_CLINIC_OR_DEPARTMENT_OTHER): Payer: Self-pay | Admitting: Orthopaedic Surgery

## 2021-11-08 ENCOUNTER — Encounter (HOSPITAL_BASED_OUTPATIENT_CLINIC_OR_DEPARTMENT_OTHER): Payer: Self-pay

## 2021-11-09 ENCOUNTER — Other Ambulatory Visit (HOSPITAL_BASED_OUTPATIENT_CLINIC_OR_DEPARTMENT_OTHER): Payer: Self-pay

## 2021-11-10 ENCOUNTER — Encounter (HOSPITAL_BASED_OUTPATIENT_CLINIC_OR_DEPARTMENT_OTHER): Payer: BC Managed Care – PPO | Admitting: Physical Therapy

## 2021-11-11 ENCOUNTER — Other Ambulatory Visit (HOSPITAL_BASED_OUTPATIENT_CLINIC_OR_DEPARTMENT_OTHER): Payer: Self-pay

## 2021-11-11 ENCOUNTER — Other Ambulatory Visit: Payer: Self-pay

## 2021-11-11 ENCOUNTER — Ambulatory Visit (HOSPITAL_BASED_OUTPATIENT_CLINIC_OR_DEPARTMENT_OTHER)
Admission: RE | Admit: 2021-11-11 | Discharge: 2021-11-11 | Disposition: A | Discharge status: Home / Self Care | Payer: BC Managed Care – PPO | Attending: Orthopaedic Surgery | Admitting: Orthopaedic Surgery

## 2021-11-11 ENCOUNTER — Ambulatory Visit (HOSPITAL_BASED_OUTPATIENT_CLINIC_OR_DEPARTMENT_OTHER): Payer: BC Managed Care – PPO | Admitting: Certified Registered"

## 2021-11-11 ENCOUNTER — Encounter (HOSPITAL_BASED_OUTPATIENT_CLINIC_OR_DEPARTMENT_OTHER): Payer: Self-pay | Admitting: Orthopaedic Surgery

## 2021-11-11 ENCOUNTER — Encounter (HOSPITAL_BASED_OUTPATIENT_CLINIC_OR_DEPARTMENT_OTHER)
Admission: RE | Disposition: A | Discharge status: Home / Self Care | Payer: Self-pay | Source: Home / Self Care | Attending: Orthopaedic Surgery

## 2021-11-11 DIAGNOSIS — Z87891 Personal history of nicotine dependence: Secondary | ICD-10-CM | POA: Diagnosis not present

## 2021-11-11 DIAGNOSIS — S43492A Other sprain of left shoulder joint, initial encounter: Secondary | ICD-10-CM | POA: Insufficient documentation

## 2021-11-11 DIAGNOSIS — S43432A Superior glenoid labrum lesion of left shoulder, initial encounter: Secondary | ICD-10-CM | POA: Diagnosis not present

## 2021-11-11 HISTORY — DX: Unspecified injury of shoulder and upper arm, unspecified arm, initial encounter: S49.90XA

## 2021-11-11 HISTORY — PX: SHOULDER ARTHROSCOPY WITH LABRAL REPAIR: SHX5691

## 2021-11-11 LAB — POCT PREGNANCY, URINE: Preg Test, Ur: NEGATIVE

## 2021-11-11 SURGERY — ARTHROSCOPY, SHOULDER, WITH GLENOID LABRUM REPAIR
Anesthesia: General | Site: Shoulder | Laterality: Left

## 2021-11-11 MED ORDER — FENTANYL CITRATE (PF) 100 MCG/2ML IJ SOLN
INTRAMUSCULAR | Status: AC
  Filled 2021-11-11: qty 2

## 2021-11-11 MED ORDER — BUPIVACAINE HCL (PF) 0.5 % IJ SOLN
INTRAMUSCULAR | Status: DC | PRN
  Administered 2021-11-11: 20 mL via PERINEURAL

## 2021-11-11 MED ORDER — AMISULPRIDE (ANTIEMETIC) 5 MG/2ML IV SOLN: 10.0000 mg | Freq: Once | INTRAVENOUS | Status: DC | PRN

## 2021-11-11 MED ORDER — FENTANYL CITRATE (PF) 100 MCG/2ML IJ SOLN
100.0000 ug | Freq: Once | INTRAMUSCULAR | Status: AC
  Administered 2021-11-11: 100 ug via INTRAVENOUS

## 2021-11-11 MED ORDER — FENTANYL CITRATE (PF) 100 MCG/2ML IJ SOLN
INTRAMUSCULAR | Status: DC | PRN
  Administered 2021-11-11 (×2): 25 ug via INTRAVENOUS

## 2021-11-11 MED ORDER — ACETAMINOPHEN 500 MG PO TABS
1000.0000 mg | ORAL_TABLET | Freq: Once | ORAL | Status: AC
  Administered 2021-11-11: 1000 mg via ORAL

## 2021-11-11 MED ORDER — BUPIVACAINE LIPOSOME 1.3 % IJ SUSP
INTRAMUSCULAR | Status: DC | PRN
Start: 2021-11-11 — End: 2021-11-11
  Administered 2021-11-11: 10 mL via PERINEURAL

## 2021-11-11 MED ORDER — DEXAMETHASONE SODIUM PHOSPHATE 10 MG/ML IJ SOLN
INTRAMUSCULAR | Status: DC | PRN
  Administered 2021-11-11: 10 mg via INTRAVENOUS

## 2021-11-11 MED ORDER — TRANEXAMIC ACID-NACL 1000-0.7 MG/100ML-% IV SOLN
1000.0000 mg | INTRAVENOUS | Status: AC
  Administered 2021-11-11: 1000 mg via INTRAVENOUS

## 2021-11-11 MED ORDER — MIDAZOLAM HCL 2 MG/2ML IJ SOLN
INTRAMUSCULAR | Status: AC
  Filled 2021-11-11: qty 2

## 2021-11-11 MED ORDER — MIDAZOLAM HCL 2 MG/2ML IJ SOLN
2.0000 mg | Freq: Once | INTRAMUSCULAR | Status: AC
Start: 2021-11-11 — End: 2021-11-11
  Administered 2021-11-11: 2 mg via INTRAVENOUS

## 2021-11-11 MED ORDER — CEFAZOLIN SODIUM-DEXTROSE 2-4 GM/100ML-% IV SOLN
2.0000 g | INTRAVENOUS | Status: AC
  Administered 2021-11-11: 2 g via INTRAVENOUS

## 2021-11-11 MED ORDER — LIDOCAINE HCL (CARDIAC) PF 100 MG/5ML IV SOSY
PREFILLED_SYRINGE | INTRAVENOUS | Status: DC | PRN
Start: 2021-11-11 — End: 2021-11-11
  Administered 2021-11-11: 40 mg via INTRAVENOUS

## 2021-11-11 MED ORDER — PHENYLEPHRINE HCL (PRESSORS) 10 MG/ML IV SOLN
INTRAVENOUS | Status: DC | PRN
  Administered 2021-11-11 (×2): 40 ug via INTRAVENOUS

## 2021-11-11 MED ORDER — OXYCODONE HCL 5 MG/5ML PO SOLN: 5.0000 mg | Freq: Once | ORAL | Status: DC | PRN

## 2021-11-11 MED ORDER — MEPERIDINE HCL 25 MG/ML IJ SOLN: 6.2500 mg | INTRAMUSCULAR | Status: DC | PRN

## 2021-11-11 MED ORDER — TRANEXAMIC ACID-NACL 1000-0.7 MG/100ML-% IV SOLN
INTRAVENOUS | Status: AC
  Filled 2021-11-11: qty 100

## 2021-11-11 MED ORDER — CEFAZOLIN SODIUM-DEXTROSE 2-4 GM/100ML-% IV SOLN
INTRAVENOUS | Status: AC
  Filled 2021-11-11: qty 100

## 2021-11-11 MED ORDER — ONDANSETRON HCL 4 MG/2ML IJ SOLN
INTRAMUSCULAR | Status: DC | PRN
  Administered 2021-11-11: 4 mg via INTRAVENOUS

## 2021-11-11 MED ORDER — ACETAMINOPHEN 500 MG PO TABS
ORAL_TABLET | ORAL | Status: AC
  Filled 2021-11-11: qty 2

## 2021-11-11 MED ORDER — SODIUM CHLORIDE 0.9 % IR SOLN: Status: DC | PRN

## 2021-11-11 MED ORDER — ONDANSETRON HCL 4 MG/2ML IJ SOLN
INTRAMUSCULAR | Status: AC
  Filled 2021-11-11: qty 2

## 2021-11-11 MED ORDER — LIDOCAINE 2% (20 MG/ML) 5 ML SYRINGE
INTRAMUSCULAR | Status: AC
  Filled 2021-11-11: qty 5

## 2021-11-11 MED ORDER — LACTATED RINGERS IV SOLN: INTRAVENOUS | Status: DC

## 2021-11-11 MED ORDER — DEXAMETHASONE SODIUM PHOSPHATE 10 MG/ML IJ SOLN
INTRAMUSCULAR | Status: AC
  Filled 2021-11-11: qty 1

## 2021-11-11 MED ORDER — PROMETHAZINE HCL 25 MG/ML IJ SOLN: 6.2500 mg | INTRAMUSCULAR | Status: DC | PRN

## 2021-11-11 MED ORDER — PROPOFOL 10 MG/ML IV BOLUS
INTRAVENOUS | Status: AC
  Filled 2021-11-11: qty 20

## 2021-11-11 MED ORDER — EPHEDRINE SULFATE (PRESSORS) 50 MG/ML IJ SOLN
INTRAMUSCULAR | Status: DC | PRN
  Administered 2021-11-11 (×2): 5 mg via INTRAVENOUS

## 2021-11-11 MED ORDER — GABAPENTIN 300 MG PO CAPS
300.0000 mg | ORAL_CAPSULE | Freq: Once | ORAL | Status: AC
  Administered 2021-11-11: 300 mg via ORAL

## 2021-11-11 MED ORDER — EPHEDRINE 5 MG/ML INJ
INTRAVENOUS | Status: AC
  Filled 2021-11-11: qty 5

## 2021-11-11 MED ORDER — HYDROMORPHONE HCL 1 MG/ML IJ SOLN: 0.2500 mg | INTRAMUSCULAR | Status: DC | PRN

## 2021-11-11 MED ORDER — GABAPENTIN 300 MG PO CAPS
ORAL_CAPSULE | ORAL | Status: AC
  Filled 2021-11-11: qty 1

## 2021-11-11 MED ORDER — OXYCODONE HCL 5 MG PO TABS: 5.0000 mg | ORAL_TABLET | Freq: Once | ORAL | Status: DC | PRN

## 2021-11-11 MED ORDER — PHENYLEPHRINE 40 MCG/ML (10ML) SYRINGE FOR IV PUSH (FOR BLOOD PRESSURE SUPPORT)
PREFILLED_SYRINGE | INTRAVENOUS | Status: AC
  Filled 2021-11-11: qty 10

## 2021-11-11 MED ORDER — PROPOFOL 10 MG/ML IV BOLUS
INTRAVENOUS | Status: DC | PRN
  Administered 2021-11-11: 200 mg via INTRAVENOUS

## 2021-11-11 SURGICAL SUPPLY — 68 items
ANCH SUT 2 FBRTK KNTLS 1.8 (Anchor) ×5 IMPLANT
ANCH SUT SHRT 12.5 CANN EYLT (Anchor) ×1 IMPLANT
ANCHOR SUT 1.8 FIBERTAK SB KL (Anchor) ×5 IMPLANT
ANCHOR SUT BIOCOMP LK 2.9X12.5 (Anchor) ×1 IMPLANT
APL PRP STRL LF DISP 70% ISPRP (MISCELLANEOUS) ×1
BLADE EXCALIBUR 4.0X13 (MISCELLANEOUS) ×2 IMPLANT
BLADE SHAVER TORPEDO 4X13 (MISCELLANEOUS) ×1 IMPLANT
BUR SURG 4D 13L RD FLUTE (BUR) IMPLANT
BURR OVAL 8 FLU 4.0X13 (MISCELLANEOUS) IMPLANT
BURR SURG 4D 13L RD FLUTE (BUR)
CANNULA 5.75X71 LONG (CANNULA) IMPLANT
CANNULA 7X7 TWIST-IN (CANNULA) IMPLANT
CANNULA PASSPORT 5 (CANNULA) IMPLANT
CANNULA PASSPORT BUTTON 10-40 (CANNULA) IMPLANT
CANNULA TWIST IN 8.25X7CM (CANNULA) ×2 IMPLANT
CHLORAPREP W/TINT 26 (MISCELLANEOUS) ×2 IMPLANT
COOLER ICEMAN CLASSIC (MISCELLANEOUS) ×2 IMPLANT
DRAPE IMP U-DRAPE 54X76 (DRAPES) ×2 IMPLANT
DRAPE INCISE IOBAN 66X45 STRL (DRAPES) ×2 IMPLANT
DRAPE SHOULDER BEACH CHAIR (DRAPES) ×2 IMPLANT
DRAPE U-SHAPE 47X51 STRL (DRAPES) ×4 IMPLANT
DRSG PAD ABDOMINAL 8X10 ST (GAUZE/BANDAGES/DRESSINGS) ×2 IMPLANT
DRSG TEGADERM 4X4.75 (GAUZE/BANDAGES/DRESSINGS) ×4 IMPLANT
DW OUTFLOW CASSETTE/TUBE SET (MISCELLANEOUS) ×2 IMPLANT
GAUZE SPONGE 4X4 12PLY STRL (GAUZE/BANDAGES/DRESSINGS) ×2 IMPLANT
GAUZE XEROFORM 1X8 LF (GAUZE/BANDAGES/DRESSINGS) ×2 IMPLANT
GLOVE SRG 8 PF TXTR STRL LF DI (GLOVE) ×1 IMPLANT
GLOVE SURG ENC MOIS LTX SZ6 (GLOVE) ×4 IMPLANT
GLOVE SURG ENC MOIS LTX SZ7.5 (GLOVE) ×2 IMPLANT
GLOVE SURG LTX SZ8 (GLOVE) ×2 IMPLANT
GLOVE SURG UNDER POLY LF SZ6.5 (GLOVE) ×2 IMPLANT
GLOVE SURG UNDER POLY LF SZ8 (GLOVE) ×2
GOWN STRL REUS W/ TWL LRG LVL3 (GOWN DISPOSABLE) ×2 IMPLANT
GOWN STRL REUS W/TWL LRG LVL3 (GOWN DISPOSABLE) ×4
GOWN STRL REUS W/TWL XL LVL3 (GOWN DISPOSABLE) ×2 IMPLANT
KIT CVD SPEAR FBRTK 1.8 DRILL (KITS) ×1 IMPLANT
KIT PERC INSERT 3.0 KNTLS (KITS) ×1 IMPLANT
KIT PUSHLOCK 2.9 HIP (KITS) ×1 IMPLANT
KIT STR SPEAR 1.8 FBRTK DISP (KITS) ×1 IMPLANT
LASSO 90 CVE QUICKPAS (DISPOSABLE) IMPLANT
LASSO CRESCENT QUICKPASS (SUTURE) IMPLANT
MANIFOLD NEPTUNE II (INSTRUMENTS) ×2 IMPLANT
NDL SAFETY ECLIPSE 18X1.5 (NEEDLE) ×1 IMPLANT
NEEDLE HYPO 18GX1.5 SHARP (NEEDLE) ×2
PACK ARTHROSCOPY DSU (CUSTOM PROCEDURE TRAY) ×2 IMPLANT
PACK BASIN DAY SURGERY FS (CUSTOM PROCEDURE TRAY) ×2 IMPLANT
PAD COLD SHLDR WRAP-ON (PAD) ×2 IMPLANT
PORT APPOLLO RF 90DEGREE MULTI (SURGICAL WAND) ×1 IMPLANT
SHEET MEDIUM DRAPE 40X70 STRL (DRAPES) IMPLANT
SLEEVE ARM SUSPENSION SYSTEM (MISCELLANEOUS) ×2 IMPLANT
SLEEVE SCD COMPRESS KNEE MED (STOCKING) ×2 IMPLANT
SLING S3 LATERAL DISP (MISCELLANEOUS) ×2 IMPLANT
SUT ETHILON 3 0 PS 1 (SUTURE) ×2 IMPLANT
SUT FIBERWIRE #2 38 T-5 BLUE (SUTURE)
SUT LASSO 45 DEG R (SUTURE) ×1 IMPLANT
SUT PDS AB 1 CT  36 (SUTURE)
SUT PDS AB 1 CT 36 (SUTURE) IMPLANT
SUT TIGER TAPE 7 IN WHITE (SUTURE) IMPLANT
SUTURE FIBERWR #2 38 T-5 BLUE (SUTURE) IMPLANT
SUTURE TAPE 1.3 40 TPR END (SUTURE) IMPLANT
SUTURE TAPE TIGERLINK 1.3MM BL (SUTURE) IMPLANT
SUTURETAPE 1.3 40 TPR END (SUTURE)
SUTURETAPE TIGERLINK 1.3MM BL (SUTURE)
SYR 5ML LL (SYRINGE) ×2 IMPLANT
TAPE FIBER 2MM 7IN #2 BLUE (SUTURE) IMPLANT
TOWEL GREEN STERILE FF (TOWEL DISPOSABLE) ×4 IMPLANT
TUBE CONNECTING 20X1/4 (TUBING) ×2 IMPLANT
TUBING ARTHROSCOPY IRRIG 16FT (MISCELLANEOUS) ×2 IMPLANT

## 2021-11-11 NOTE — Transfer of Care (Signed)
Immediate Anesthesia Transfer of Care Note  Patient: Rhonda [name]  Procedure(s) Performed: Left SHOULDER ARTHROSCOPY WITH LABRAL REPAIR (Left: Shoulder)  Patient Location: PACU  Anesthesia Type:GA combined with regional for post-op pain  Level of Consciousness: drowsy  Airway & Oxygen Therapy: Patient Spontanous Breathing and Patient connected to face mask oxygen  Post-op Assessment: Report given to RN and Post -op Vital signs reviewed and stable  Post vital signs: Reviewed and stable  Last Vitals:  Vitals Value Taken Time  BP 127/91 11/11/21 0947  Temp    Pulse 57 11/11/21 0949  Resp 21 11/11/21 0950  SpO2 98 % 11/11/21 0949  Vitals shown include unvalidated device data.  Last Pain:  Vitals:   11/11/21 0649  TempSrc: Oral  PainSc: 4       Patients Stated Pain Goal: 5 (11/11/21 7858)  Complications: No notable events documented.

## 2021-11-11 NOTE — Anesthesia Postprocedure Evaluation (Signed)
Anesthesia Post Note  Patient: Rhonda [name]  Procedure(s) Performed: Left SHOULDER ARTHROSCOPY WITH LABRAL REPAIR (Left: Shoulder)     Patient location during evaluation: PACU Anesthesia Type: General Level of consciousness: awake and alert Pain management: pain level controlled Vital Signs Assessment: post-procedure vital signs reviewed and stable Respiratory status: spontaneous breathing, nonlabored ventilation and respiratory function stable Cardiovascular status: blood pressure returned to baseline and stable Postop Assessment: no apparent nausea or vomiting Anesthetic complications: no   No notable events documented.  Last Vitals:  Vitals:   11/11/21 1015 11/11/21 1057  BP: (!) 124/100 (!) 129/96  Pulse: 88 70  Resp: 17 18  Temp:  37 C  SpO2: 95% 97%    Last Pain:  Vitals:   11/11/21 1057  TempSrc: Oral  PainSc: 0-No pain                 Lowella Curb

## 2021-11-11 NOTE — Anesthesia Procedure Notes (Signed)
Anesthesia Regional Block: Interscalene brachial plexus block   Pre-Anesthetic Checklist: , timeout performed,  Correct Patient, Correct Site, Correct Laterality,  Correct Procedure, Correct Position, site marked,  Risks and benefits discussed,  Surgical consent,  Pre-op evaluation,  At surgeon's request and post-op pain management  Laterality: Left  Prep: chloraprep       Needles:  Injection technique: Single-shot  Needle Type: Stimiplex     Needle Length: 9cm  Needle Gauge: 21     Additional Needles:   Procedures:,,,, ultrasound used (permanent image in chart),,    Narrative:  Start time: 11/11/2021 7:13 AM End time: 11/11/2021 7:18 AM Injection made incrementally with aspirations every 5 mL.  Performed by: Personally  Anesthesiologist: Lowella Curb, MD

## 2021-11-11 NOTE — Anesthesia Procedure Notes (Signed)
Procedure Name: LMA Insertion Date/Time: 11/11/2021 7:48 AM Performed by: Lauralyn Primes, CRNA Pre-anesthesia Checklist: Patient identified, Emergency Drugs available, Suction available and Patient being monitored Patient Re-evaluated:Patient Re-evaluated prior to induction Oxygen Delivery Method: Circle system utilized Preoxygenation: Pre-oxygenation with 100% oxygen Induction Type: IV induction Ventilation: Mask ventilation without difficulty LMA: LMA inserted LMA Size: 4.0 Number of attempts: 1 Airway Equipment and Method: Bite block Placement Confirmation: positive ETCO2 Tube secured with: Tape Dental Injury: Teeth and Oropharynx as per pre-operative assessment

## 2021-11-11 NOTE — Progress Notes (Signed)
Assisted Dr. Miller with left, ultrasound guided, supraclavicular block. Side rails up, monitors on throughout procedure. See vital signs in flow sheet. Tolerated Procedure well. 

## 2021-11-11 NOTE — Brief Op Note (Signed)
° °  Brief Op Note  Date of Surgery: 11/11/2021  Preoperative Diagnosis: * No pre-op diagnosis entered *  Postoperative Diagnosis: same  Procedure: Procedure(s): Left SHOULDER ARTHROSCOPY WITH LABRAL REPAIR  Implants: Implant Name Type Inv. Item Serial No. Manufacturer Lot No. LRB No. Used Action  ANCHOR SUT 1.8 FIBERT[employer] SB KL - [id number] Anchor ANCHOR SUT 1.8 FIBERTAK SB KL  [employer] [id number] Left 1 Implanted  ANCHOR SUT 1.8 FIBERTAK SB KL - [id number] Anchor ANCHOR SUT 1.8 FIBERTAK SB KL  [employer] [id number] Left 1 Implanted  ANCHOR SUT BIOCOMP LK 2.9X12.5 - [id number] Anchor ANCHOR SUT BIOCOMP LK 2.9X12.5  [employer] [id number] Left 1 Implanted  ANCHOR SUT 1.8 FIBERTAK SB KL - [id number] Anchor ANCHOR SUT 1.8 FIBERTAK SB KL  [employer] [id number] Left 1 Implanted  ANCHOR SUT 1.8 FIBERTAK SB KL - [id number] Anchor ANCHOR SUT 1.8 FIBERTAK SB KL  [employer] [id number] Left 1 Implanted  ANCHOR SUT 1.8 FIBERTAK SB KL - [id number] Anchor ANCHOR SUT 1.8 [employer] [id number] Left 1 Implanted    Surgeons: Surgeon(s): Huel Cote, MD  Anesthesia: General    Estimated Blood Loss: See anesthesia record  Complications: None  Condition to PACU: Stable  Benancio Deeds, MD 11/11/2021 9:39 AM

## 2021-11-11 NOTE — Interval H&P Note (Signed)
History and Physical Interval Note:  11/11/2021 7:07 AM  Rhonda [name]  has presented today for surgery, with the diagnosis of Left shoulder labral tear.  The various methods of treatment have been discussed with the patient and family. After consideration of risks, benefits and other options for treatment, the patient has consented to  Procedure(s): Left SHOULDER ARTHROSCOPY WITH LABRAL REPAIR (Left) as a surgical intervention.  The patient's history has been reviewed, patient examined, no change in status, stable for surgery.  I have reviewed the patient's chart and labs.  Questions were answered to the patient's satisfaction.     [name]

## 2021-11-11 NOTE — Anesthesia Preprocedure Evaluation (Signed)
Anesthesia Evaluation  Patient identified by MRN, date of birth, ID band Patient awake    Reviewed: Allergy & Precautions, NPO status , Patient's Chart, lab work & pertinent test results  Airway Mallampati: II  TM Distance: >3 FB Neck ROM: Full    Dental no notable dental hx.    Pulmonary neg pulmonary ROS, former smoker,    Pulmonary exam normal breath sounds clear to auscultation       Cardiovascular negative cardio ROS Normal cardiovascular exam Rhythm:Regular Rate:Normal     Neuro/Psych negative neurological ROS  negative psych ROS   GI/Hepatic negative GI ROS, Neg liver ROS,   Endo/Other  negative endocrine ROS  Renal/GU negative Renal ROS  negative genitourinary   Musculoskeletal negative musculoskeletal ROS (+)   Abdominal   Peds negative pediatric ROS (+)  Hematology negative hematology ROS (+)   Anesthesia Other Findings   Reproductive/Obstetrics negative OB ROS                            Anesthesia Physical Anesthesia Plan  ASA: 2  Anesthesia Plan: General   Post-op Pain Management:    Induction: Intravenous  PONV Risk Score and Plan: 3 and Ondansetron, Dexamethasone, Midazolam and Treatment may vary due to age or medical condition  Airway Management Planned: LMA  Additional Equipment:   Intra-op Plan:   Post-operative Plan: Extubation in OR  Informed Consent: I have reviewed the patients History and Physical, chart, labs and discussed the procedure including the risks, benefits and alternatives for the proposed anesthesia with the patient or authorized representative who has indicated his/her understanding and acceptance.     Dental advisory given  Plan Discussed with: CRNA  Anesthesia Plan Comments:         Anesthesia Quick Evaluation  

## 2021-11-11 NOTE — Discharge Instructions (Addendum)
Discharge Instructions    Attending Surgeon: Huel Cote, MD Office Phone Number: [phone]   Diagnosis and Procedures:    Surgeries Performed: Left Labrum Repair  Discharge Plan:    Diet: Resume usual diet. Begin with light or bland foods.  Drink plenty of fluids.  Activity:  Keep sling and dressing in place until your follow up visit in Physical Therapy You are advised to go home directly from the hospital or surgical center. Restrict your activities.  GENERAL INSTRUCTIONS: 1.  Keep your surgical site elevated above your heart for at least 5-7 days or longer to prevent swelling. This will improve your comfort and your overall recovery following surgery.     2. Please call Dr. Serena Croissant office at [phone] with questions Monday-Friday during business hours. If no one answers, please leave a message and someone should get back to the patient within 24 hours. For emergencies please call 911 or proceed to the emergency room.   3. Patient to notify surgical team if experiences any of the following: Bowel/Bladder dysfunction, uncontrolled pain, nerve/muscle weakness, incision with increased drainage or redness, nausea/vomiting and Fever greater than 101.0 F.  Be alert for signs of infection including redness, streaking, odor, fever or chills. Be alert for excessive pain or bleeding and notify your surgeon immediately.  WOUND INSTRUCTIONS:   Leave your dressing/cast/splint in place until your post operative visit.  Keep it clean and dry.  Always keep the incision clean and dry until the staples/sutures are removed. If there is no drainage from the incision you should keep it open to air. If there is drainage from the incision you must keep it covered at all times until the drainage stops  Do not soak in a bath tub, hot tub, pool, lake or other body of water until 21 days after your surgery and your incision is completely dry and healed.  If you have removable sutures  (or staples) they must be removed 10-14 days (unless otherwise instructed) from the day of your surgery.     1)  Elevate the extremity as much as possible.  2)  Keep the dressing clean and dry.  3)  Please call us if the dressing becomes wet or dirty.  4)  If you are experiencing worsening pain or worsening swelling, please call.     MEDICATIONS: Resume all previous home medications at the previous prescribed dose and frequency unless otherwise noted Start taking the  pain medications on an as-needed basis as prescribed  Please taper down pain medication over the next week following surgery.  Ideally you should not require a refill of any narcotic pain medication.  Take pain medication with food to minimize nausea. In addition to the prescribed pain medication, you may take over-the-counter pain relievers such as Tylenol.  Do NOT take additional tylenol if your pain medication already has tylenol in it.  Aspirin 325mg  daily for four weeks.      FOLLOWUP INSTRUCTIONS: 1. Follow up at the Physical Therapy Clinic 3-4 days following surgery. This appointment should be scheduled unless other arrangements have been made.The Physical Therapy scheduling number is [phone] if an appointment has not already been arranged.  2. Contact Dr. Serena Croissant office during office hours at [phone] or the practice after hours line at [phone] for non-emergencies. For medical emergencies call 911.   Discharge Location: Home     Next dose of Tylenol can be given at 1:00pm if needed.   Post Anesthesia Home Care Instructions  Activity: Get plenty of rest for the remainder of the day. A responsible individual must stay with you for 24 hours following the procedure.  For the next 24 hours, DO NOT: -Drive a car -Advertising copywriter -Drink alcoholic beverages -Take any medication unless instructed by your physician -Make any legal decisions or sign important papers.  Meals: Start with liquid  foods such as gelatin or soup. Progress to regular foods as tolerated. Avoid greasy, spicy, heavy foods. If nausea and/or vomiting occur, drink only clear liquids until the nausea and/or vomiting subsides. Call your physician if vomiting continues.  Special Instructions/Symptoms: Your throat may feel dry or sore from the anesthesia or the breathing tube placed in your throat during surgery. If this causes discomfort, gargle with warm salt water. The discomfort should disappear within 24 hours.  If you had a scopolamine patch placed behind your ear for the management of post- operative nausea and/or vomiting:  1. The medication in the patch is effective for 72 hours, after which it should be removed.  Wrap patch in a tissue and discard in the trash. Wash hands thoroughly with soap and water. 2. You may remove the patch earlier than 72 hours if you experience unpleasant side effects which may include dry mouth, dizziness or visual disturbances. 3. Avoid touching the patch. Wash your hands with soap and water after contact with the patch.  Regional Anesthesia Blocks  1. Numbness or the inability to move the "blocked" extremity may last from 3-48 hours after placement. The length of time depends on the medication injected and your individual response to the medication. If the numbness is not going away after 48 hours, call your surgeon.  2. The extremity that is blocked will need to be protected until the numbness is gone and the  Strength has returned. Because you cannot feel it, you will need to take extra care to avoid injury. Because it may be weak, you may have difficulty moving it or using it. You may not know what position it is in without looking at it while the block is in effect.  3. For blocks in the legs and feet, returning to weight bearing and walking needs to be done carefully. You will need to wait until the numbness is entirely gone and the strength has returned. You should be able to  move your leg and foot normally before you try and bear weight or walk. You will need someone to be with you when you first try to ensure you do not fall and possibly risk injury.  4. Bruising and tenderness at the needle site are common side effects and will resolve in a few days.  5. Persistent numbness or new problems with movement should be communicated to the surgeon or the [facility] [phone] [facility] [phone]). Regional Anesthesia Blocks  1. Numbness or the inability to move the "blocked" extremity may last from 3-48 hours after placement. The length of time depends on the medication injected and your individual response to the medication. If the numbness is not going away after 48 hours, call your surgeon.  2. The extremity that is blocked will need to be protected until the numbness is gone and the  Strength has returned. Because you cannot feel it, you will need to take extra care to avoid injury. Because it may be weak, you may have difficulty moving it or using it. You may not know what position it is in without looking at it while  the block is in effect.  3. For blocks in the legs and feet, returning to weight bearing and walking needs to be done carefully. You will need to wait until the numbness is entirely gone and the strength has returned. You should be able to move your leg and foot normally before you try and bear weight or walk. You will need someone to be with you when you first try to ensure you do not fall and possibly risk injury.  4. Bruising and tenderness at the needle site are common side effects and will resolve in a few days.  5. Persistent numbness or new problems with movement should be communicated to the surgeon or the [facility] [phone] [facility] [phone]).Regional Anesthesia Blocks  1. Numbness or the inability to move the "blocked" extremity may last from 3-48 hours after placement.  The length of time depends on the medication injected and your individual response to the medication. If the numbness is not going away after 48 hours, call your surgeon.  2. The extremity that is blocked will need to be protected until the numbness is gone and the  Strength has returned. Because you cannot feel it, you will need to take extra care to avoid injury. Because it may be weak, you may have difficulty moving it or using it. You may not know what position it is in without looking at it while the block is in effect.  3. For blocks in the legs and feet, returning to weight bearing and walking needs to be done carefully. You will need to wait until the numbness is entirely gone and the strength has returned. You should be able to move your leg and foot normally before you try and bear weight or walk. You will need someone to be with you when you first try to ensure you do not fall and possibly risk injury.  4. Bruising and tenderness at the needle site are common side effects and will resolve in a few days.  5. Persistent numbness or new problems with movement should be communicated to the surgeon or the [facility] [phone] [facility] [phone]).Information for Discharge Teaching: EXPAREL (bupivacaine liposome injectable suspension)   Your surgeon or anesthesiologist gave you EXPAREL(bupivacaine) to help control your pain after surgery.  EXPAREL is a local anesthetic that provides pain relief by numbing the tissue around the surgical site. EXPAREL is designed to release pain medication over time and can control pain for up to 72 hours. Depending on how you respond to EXPAREL, you may require less pain medication during your recovery.  Possible side effects: Temporary loss of sensation or ability to move in the area where bupivacaine was injected. Nausea, vomiting, constipation Rarely, numbness and tingling in your mouth or lips, lightheadedness, or  anxiety may occur. Call your doctor right away if you think you may be experiencing any of these sensations, or if you have other questions regarding possible side effects.  Follow all other discharge instructions given to you by your surgeon or nurse. Eat a healthy diet and drink plenty of water or other fluids.  If you return to the hospital for any reason within 96 hours following the administration of EXPAREL, it is important for health care providers to know that you have received this anesthetic. A teal colored band has been placed on your arm with the date, time and amount of EXPAREL you have received in order to alert and inform your  health care providers. Please leave this armband in place for the full 96 hours following administration, and then you may remove the band.        Donjoy Ultrasling III (Red ball):  Please contact your surgeon if you have questions or concerns about your sling.

## 2021-11-11 NOTE — Op Note (Addendum)
Date of Surgery: 11/11/2021  INDICATIONS: Rhonda [name] is a 31 y.o.-year-old female with presents with left inferior posterior labral tear which is failed conservative management with extensive physical therapy.  The risk and benefits of the procedure with discussed in detail and documented in the pre-operative evaluation.  PREOPERATIVE DIAGNOSIS: 1.  Left shoulder labral tear  POSTOPERATIVE DIAGNOSIS: Same.  PROCEDURE: 1.  Left shoulder posterior inferior labral repair 2.  Left shoulder extensive debridement  SURGEON: Yevonne Pax MD  ASSISTANT: Arie Sabina, RN  ANESTHESIA:  general plus interscalene block  IV FLUIDS AND URINE: See anesthesia record.  ANTIBIOTICS: Ancef 2 g  ESTIMATED BLOOD LOSS: 10 mL.  IMPLANTS:  Implant Name Type Inv. Item Serial No. Manufacturer Lot No. LRB No. Used Action  ANCHOR SUT 1.8 [employer] SB KL - [id number] Anchor ANCHOR SUT 1.8 FIBERTAK SB KL  [employer] [id number] Left 1 Implanted  ANCHOR SUT 1.8 FIBERTAK SB KL - [id number] Anchor ANCHOR SUT 1.8 FIBERTAK SB KL  [employer] [id number] Left 1 Implanted  ANCHOR SUT BIOCOMP LK 2.9X12.5 - [id number] Anchor ANCHOR SUT BIOCOMP LK 2.9X12.5  [employer] [id number] Left 1 Implanted  ANCHOR SUT 1.8 FIBERTAK SB KL - [id number] Anchor ANCHOR SUT 1.8 FIBERTAK SB KL  [employer] [id number] Left 1 Implanted  ANCHOR SUT 1.8 FIBERTAK SB KL - [id number] Anchor ANCHOR SUT 1.8 FIBERTAK SB KL  [employer] [id number] Left 1 Implanted  ANCHOR SUT 1.8 FIBERTAK SB KL - [id number] Anchor ANCHOR SUT 1.8 FIBERTAK SB KL  [employer] [id number] Left 1 Implanted    DRAINS: None  CULTURES: None  COMPLICATIONS: none  DESCRIPTION OF PROCEDURE:  Examination under anesthesia revealed forward elevation of 170 degrees.  In abduction, there was 80 degrees of external rotation and 70 degrees of internal rotation.  With the arm at the side, there was 70 degrees of external rotation.  There is a 1+ anterior load shift and a 2+ posterior load shift.   8 mm of inferior humeral head translation in internal rotation, 8 mm in neutral rotation, 8 mm in external rotation.   Arthroscopic findings demonstrated:  Glenoid cartilage: Normal Humeral head: Normal Labrum: Tear from the 530 to 9 o'clock position Biceps insertion: Fraying at the base Biceps tendon: Intact Subscapularis insertion: Intact Rotator cuff: Normal    The patient was identified in the preoperative holding area.  The correct site was marked according to universal protocol with nursing.  Anesthesia subsequently performed interscalene block.  Ancef was given 1 hour prior to skin incision.  She is subsequently taken back to the operating room.  She was prepped and draped in the lateral position.  All bony prominences including the peroneal nerve were padded.  Axillary roll was placed.  Standard posterior, anterior and anterosuperolateral portals were utilized. The posterior portal was created with an 11-blade and the arthroscope introduced into the glenohumeral joint.  A full diagnostic arthroscopy was performed as described above.  A low anterior portal just above the rolled border of the subscapularis was identified with a spinal needle.  An anterosuperolateral viewing portal was localized with a spinal needle just posterior to the biceps tendon, and the arthroscope was transferred to this portal.  An arthroscopic shaver was used to debride areas of the anterior superior and posterior labrum as well as the base of the biceps anchor back to healthy appearing tissue.  First, I directed my attention to preparation of the glenoid, labrum and capsule for repair.  Next, I sequentially repaired  the capsule and labrum from the 530 position to the 9 position with a total of 4 anchors.  These were 1.8 mm suture tack anchors.  Anchors were placed at the 536 [id number] and 830 positions.  At each location, a pilot hole was drilled, the anchor inserted and deployed.  Then, one limb of suture was shuttled  around the labrum and capsule using a suture lasso, and subsequently shuttled into the knotless mechanism and appropriately tensioned.  Once completed, the labrum was restored to an anatomic position, and tension was restored to both bands of the IGHL.  The inferior capsular volume was normalized and the humeral head was centered on the glenoid.   All instruments were removed, fluid was evacuated, and the arthroscopy portals were closed with 3-0 nylon.  A sterile dressing was applied followed by a Iceman device and a sling with an abduction pillow.    The patient awoke from anesthesia without difficulty and was transferred to PACU in stable condition.        POSTOPERATIVE PLAN: Patient will be nonweightbearing on the left upper extremity.  She will be in a sling until follow-up with physical therapy.  She will see her back in 2 weeks for suture removal.  Yevonne Pax, MD 9:40 AM

## 2021-11-12 ENCOUNTER — Encounter (HOSPITAL_BASED_OUTPATIENT_CLINIC_OR_DEPARTMENT_OTHER): Payer: Self-pay | Admitting: Orthopaedic Surgery

## 2021-11-15 ENCOUNTER — Ambulatory Visit (HOSPITAL_BASED_OUTPATIENT_CLINIC_OR_DEPARTMENT_OTHER): Payer: BC Managed Care – PPO | Attending: Orthopaedic Surgery | Admitting: Physical Therapy

## 2021-11-15 ENCOUNTER — Other Ambulatory Visit: Payer: Self-pay

## 2021-11-15 ENCOUNTER — Encounter (HOSPITAL_BASED_OUTPATIENT_CLINIC_OR_DEPARTMENT_OTHER): Payer: Self-pay | Admitting: Physical Therapy

## 2021-11-15 DIAGNOSIS — M6281 Muscle weakness (generalized): Secondary | ICD-10-CM | POA: Insufficient documentation

## 2021-11-15 DIAGNOSIS — S46012A Strain of muscle(s) and tendon(s) of the rotator cuff of left shoulder, initial encounter: Secondary | ICD-10-CM | POA: Insufficient documentation

## 2021-11-15 DIAGNOSIS — M25512 Pain in left shoulder: Secondary | ICD-10-CM | POA: Diagnosis not present

## 2021-11-15 NOTE — Therapy (Signed)
OUTPATIENT PHYSICAL THERAPY SHOULDER EVALUATION   Patient Name: Rhonda [name] MRN: [id number] DOB:05-02-1991, 31 y.o., female Today's Date: 11/16/2021   PT End of Session - 11/15/21 1436     Visit Number 1    Number of Visits 25    Date for PT Re-Evaluation 02/11/22    Authorization Type BCBS    PT Start Time 1431    PT Stop Time 1509    PT Time Calculation (min) 38 min    Activity Tolerance Patient tolerated treatment well    Behavior During Therapy WFL for tasks assessed/performed             Past Medical History:  Diagnosis Date   Medical history non-contributory    Shoulder injury    Past Surgical History:  Procedure Laterality Date   SHOULDER ARTHROSCOPY WITH LABRAL REPAIR Left 11/11/2021   Procedure: Left SHOULDER ARTHROSCOPY WITH LABRAL REPAIR;  Surgeon: Huel Cote, MD;  Location: [facility];  Service: Orthopedics;  Laterality: Left;   TONSILLECTOMY     wisdom teeth     Patient Active Problem List   Diagnosis Date Noted   Tear of left glenoid labrum    Hyperemesis gravidarum 01/08/2020   Leukocytosis 01/08/2020   Abnormal uterine bleeding 10/11/2018    PCP: Patient, No Pcp Per (Inactive)  REFERRING PROVIDER: Huel Cote, MD  REFERRING DIAG: S/p Lt labral repair  THERAPY DIAG:  Acute pain of left shoulder  Muscle weakness (generalized)   ONSET DATE: 11/11/21  SUBJECTIVE:                                                                                                                                                                                      SUBJECTIVE STATEMENT: Just taking aspirin, tylenol and ibuprofen.   PERTINENT HISTORY: None  PAIN:  Are you having pain? Yes NPRS scale: 2/10 Pain location: Lt shoulder Pain description: aching  Aggravating factors: pain increases as meds wear off Relieving factors: ice, meds  PRECAUTIONS: Shoulder  WEIGHT BEARING RESTRICTIONS Yes NWB  FALLS:  Has patient fallen in last  6 months? No Number of falls: 0  LIVING ENVIRONMENT: Lives with: lives with their family and lives alone Lives in: House/apartment   OCCUPATION: [employer]- lifting at least 50-75lb per pt, unconfirmed at eval  PLOF: Independent  PATIENT GOALS go back to work, self care  OBJECTIVE:   PATIENT SURVEYS:  FOTO 25  COGNITION:  Overall cognitive status: Within functional limits for tasks assessed     SENSATION:  Some tingling in hand  POSTURE: Forward rounded shoulder as expected  UPPER EXTREMITY AROM/PROM:  A/PROM Right 11/16/2021 Left 11/16/2021  Shoulder  flexion    Shoulder extension    Shoulder abduction    Shoulder adduction    Shoulder internal rotation    Shoulder external rotation    Elbow flexion    Elbow extension    Wrist flexion    Wrist extension    Wrist ulnar deviation    Wrist radial deviation    Wrist pronation    Wrist supination    (Blank rows = not tested)  UPPER EXTREMITY MMT:  MMT Right 11/16/2021 Left 11/16/2021  Shoulder flexion    Shoulder extension    Shoulder abduction    Shoulder adduction    Shoulder internal rotation    Shoulder external rotation    Middle trapezius    Lower trapezius    Elbow flexion    Elbow extension    Wrist flexion    Wrist extension    Wrist ulnar deviation    Wrist radial deviation    Wrist pronation    Wrist supination    Grip strength (lbs)    (Blank rows = not tested)    PALPATION:  Denies TTP, no s/s of infection   TODAY'S TREATMENT:  EVAL PT removed bandages and replaced with gauze/tegaderm Upper trap/levator stretch Elbow extension AAROM   PATIENT EDUCATION: Education details: Teacher, music of condition, POC, HEP, exercise form/rationale  Person educated: Patient Education method: Explanation, Demonstration, Tactile cues, Verbal cues, and Handouts- text HEP Education comprehension: verbalized understanding, returned demonstration, verbal cues required, tactile cues required,  and needs further education   HOME EXERCISE PROGRAM: [id number]  ASSESSMENT:  CLINICAL IMPRESSION: Patient is a 31 y.o. F who was seen today for physical therapy evaluation and treatment for s/p Lt labral repair. Pt is doing very well- provided her with a copy of her protocol to refer to as a guideline of what to expect. Encouraged her to keep the sling on in order to allow healing. Will continue to progress as appropriate.   Objective impairments include decreased activity tolerance, decreased knowledge of condition, decreased ROM, decreased strength, increased muscle spasms, impaired flexibility, impaired UE functional use, and pain. These impairments are limiting patient from cleaning, community activity, driving, meal prep, occupation, laundry, and shopping. Personal factors including Profession and Time since onset of injury/illness/exacerbation are also affecting patient's functional outcome. Patient will benefit from skilled PT to address above impairments and improve overall function.  REHAB POTENTIAL: Good  CLINICAL DECISION MAKING: Stable/uncomplicated  EVALUATION COMPLEXITY: Low   GOALS: Goals reviewed with patient? Yes  SHORT TERM GOALS:  STG Name Target Date Goal status  1 Pt will demonstrate progression with post op protocol Baseline: will progress as appropriate 12/14/2021 INITIAL  2 Independent in donning/doffing sling Baseline: educated at eval 11/30/2021 INITIAL                            LONG TERM GOALS:   LTG Name Target Date Goal status  1 Pt will demo proper form with lifting 10lb to overhead shelf Baseline: 01/25/2022 INITIAL  2 Pt will demo ROM within 10 deg of Rt UE without compensation Baseline:unable at eval 01/25/2022 INITIAL  3 Pt will demo at least 80% strength in Left arm as Rt in dynamometry testing Baseline: unable to test at eval 02/08/2022 INITIAL  4 Pt will begin increased strength training for return work regarding lifting  requirements Baseline: will progress as appropriate 02/08/2022 INITIAL  5 Pt will verbalize pain <=2/10 with ADLs Baseline: unable to use  Lt UE for ADLs at eval 02/08/2022 INITIAL             PLAN: PT FREQUENCY: 1-2x/week  PT DURATION: 12 weeks  PLANNED INTERVENTIONS: Therapeutic exercises, Therapeutic activity, Neuro Muscular re-education, Patient/Family education, Joint mobilization, Aquatic Therapy, Dry Needling, Electrical stimulation, Cryotherapy, Moist heat, scar mobilization, Taping, and Manual therapy  PLAN FOR NEXT SESSION: PROM per protocol, instruct in proper AAROM & isometrics in sling   Maxim Bedel C. Carols Clemence PT, DPT 11/16/21 11:37 AM

## 2021-11-16 ENCOUNTER — Encounter (HOSPITAL_BASED_OUTPATIENT_CLINIC_OR_DEPARTMENT_OTHER): Payer: Self-pay | Admitting: Physical Therapy

## 2021-11-25 ENCOUNTER — Other Ambulatory Visit: Payer: Self-pay

## 2021-11-25 ENCOUNTER — Encounter (HOSPITAL_BASED_OUTPATIENT_CLINIC_OR_DEPARTMENT_OTHER): Payer: BC Managed Care – PPO | Admitting: Orthopaedic Surgery

## 2021-11-25 ENCOUNTER — Ambulatory Visit (HOSPITAL_BASED_OUTPATIENT_CLINIC_OR_DEPARTMENT_OTHER): Payer: BC Managed Care – PPO | Admitting: Physical Therapy

## 2021-11-25 ENCOUNTER — Encounter (HOSPITAL_BASED_OUTPATIENT_CLINIC_OR_DEPARTMENT_OTHER): Payer: Self-pay

## 2021-11-25 ENCOUNTER — Ambulatory Visit (INDEPENDENT_AMBULATORY_CARE_PROVIDER_SITE_OTHER): Payer: BC Managed Care – PPO | Admitting: Orthopaedic Surgery

## 2021-11-25 ENCOUNTER — Encounter (HOSPITAL_BASED_OUTPATIENT_CLINIC_OR_DEPARTMENT_OTHER): Payer: Self-pay | Admitting: Physical Therapy

## 2021-11-25 DIAGNOSIS — M6281 Muscle weakness (generalized): Secondary | ICD-10-CM

## 2021-11-25 DIAGNOSIS — M25512 Pain in left shoulder: Secondary | ICD-10-CM | POA: Diagnosis not present

## 2021-11-25 DIAGNOSIS — S43432A Superior glenoid labrum lesion of left shoulder, initial encounter: Secondary | ICD-10-CM

## 2021-11-25 NOTE — Progress Notes (Signed)
Post Operative Evaluation    Procedure/Date of Surgery: 11/11/21 -left shoulder labral repair  Interval History:   Presents today for follow-up status post left shoulder labral repair.  Her pain is very well controlled.  She has been working with Shanda Bumps in physical therapy.  She has been working on passive range of motion which is going quite well   PMH/PSH/Family History/Social History/Meds/Allergies:    Past Medical History:  Diagnosis Date   Medical history non-contributory    Shoulder injury    Past Surgical History:  Procedure Laterality Date   SHOULDER ARTHROSCOPY WITH LABRAL REPAIR Left 11/11/2021   Procedure: Left SHOULDER ARTHROSCOPY WITH LABRAL REPAIR;  Surgeon: Huel Cote, MD;  Location: [facility];  Service: Orthopedics;  Laterality: Left;   TONSILLECTOMY     wisdom teeth     Social History   Socioeconomic History   Marital status: Single    Spouse name: Not on file   Number of children: Not on file   Years of education: Not on file   Highest education level: Not on file  Occupational History   Not on file  Tobacco Use   Smoking status: Former    Types: Cigarettes    Quit date: 09/30/2012    Years since quitting: 9.1   Smokeless tobacco: Never  Vaping Use   Vaping Use: Never used  Substance and Sexual Activity   Alcohol use: Not Currently   Drug use: Not Currently   Sexual activity: Yes    Birth control/protection: None  Other Topics Concern   Not on file  Social History Narrative   Not on file   Social Determinants of Health   Financial Resource Strain: Not on file  Food Insecurity: Not on file  Transportation Needs: Not on file  Physical Activity: Not on file  Stress: Not on file  Social Connections: Not on file   Family History  Problem Relation Age of Onset   Hypertension Other    No Known Allergies Current Outpatient Medications  Medication Sig Dispense Refill   aspirin EC 325 MG  tablet Take 1 tablet (325 mg total) by mouth daily. 30 tablet 0   cyclobenzaprine (FLEXERIL) 10 MG tablet Take 1 tablet (10 mg total) by mouth 2 (two) times daily as needed for muscle spasms. 10 tablet 0   ibuprofen (ADVIL) 600 MG tablet Take 1 tablet (600 mg total) by mouth every 6 (six) hours as needed. 15 tablet 0   meloxicam (MOBIC) 15 MG tablet Take 1 tablet (15 mg total) by mouth daily. 30 tablet 2   methylPREDNISolone (MEDROL DOSEPAK) 4 MG TBPK tablet Take per packet instructions 21 each 0   oxyCODONE (OXY IR/ROXICODONE) 5 MG immediate release tablet Take 1 tablet (5 mg total) by mouth every 4 (four) hours as needed (severe pain). 20 tablet 0   No current facility-administered medications for this visit.   No results found.  Review of Systems:   A ROS was performed including pertinent positives and negatives as documented in the HPI.   Musculoskeletal Exam:     Incisions are well-healed.  Sutures removed today.  Stable to flex and extend at the left elbow and the left wrist.  Sensation is intact left upper extremity  Imaging:    None  I personally reviewed and interpreted the  radiographs.   Assessment:   31 year old female who is 2 weeks status post left shoulder labral repair overall doing very well.  At this time I would like her to advance per protocol.  She is doing very well to the point where he may consider active range of motion as soon as 4 weeks. Plan :    -Continue to advance per protocol -Return to clinic in 4 weeks       I personally saw and evaluated the patient, and participated in the management and treatment plan.  Huel Cote, MD Attending Physician, Orthopedic Surgery  This document was dictated using Dragon voice recognition software. A reasonable attempt at proof reading has been made to minimize errors.

## 2021-11-25 NOTE — Therapy (Signed)
OUTPATIENT PHYSICAL THERAPY TREATMENT NOTE   Patient Name: Rhonda [name] MRN: [id number] DOB:03-17-1991, 31 y.o., female Today's Date: 11/25/2021  PCP: Patient, No Pcp Per (Inactive) REFERRING PROVIDER: Huel Cote, MD   PT End of Session - 11/25/21 1515     Visit Number 2    Number of Visits 25    Date for PT Re-Evaluation 02/11/22    Authorization Type BCBS    PT Start Time 1515    PT Stop Time 1558    PT Time Calculation (min) 43 min    Activity Tolerance Patient tolerated treatment well    Behavior During Therapy WFL for tasks assessed/performed             Past Medical History:  Diagnosis Date   Medical history non-contributory    Shoulder injury    Past Surgical History:  Procedure Laterality Date   SHOULDER ARTHROSCOPY WITH LABRAL REPAIR Left 11/11/2021   Procedure: Left SHOULDER ARTHROSCOPY WITH LABRAL REPAIR;  Surgeon: Huel Cote, MD;  Location: [facility];  Service: Orthopedics;  Laterality: Left;   TONSILLECTOMY     wisdom teeth     Patient Active Problem List   Diagnosis Date Noted   Tear of left glenoid labrum    Hyperemesis gravidarum 01/08/2020   Leukocytosis 01/08/2020   Abnormal uterine bleeding 10/11/2018    REFERRING DIAG: S/p Lt labral repair  THERAPY DIAG:  Acute pain of left shoulder  Muscle weakness (generalized)  PERTINENT HISTORY: none  PRECAUTIONS: shoulder  SUBJECTIVE: It is feeling really good  PAIN:  Are you having pain? No NPRS scale: 0/10 Pain location: Lt shoulder  Aggravating factors: laying on it Relieving factors: meds, ice  OBJECTIVE:    PATIENT SURVEYS:  FOTO 25   COGNITION:          Overall cognitive status: Within functional limits for tasks assessed                               SENSATION:          Some tingling in hand   POSTURE: Forward rounded shoulder as expected   UPPER EXTREMITY AROM/PROM:   A/PROM Left 11/25/2021  Shoulder flexion 90   Shoulder extension     Shoulder abduction    Shoulder adduction    Shoulder internal rotation    Shoulder external rotation    Elbow flexion    Elbow extension    Wrist flexion    Wrist extension    Wrist ulnar deviation    Wrist radial deviation    Wrist pronation    Wrist supination    (Blank rows = not tested)   UPPER EXTREMITY MMT:   MMT Right 11/16/2021 Left 11/16/2021  Shoulder flexion      Shoulder extension      Shoulder abduction      Shoulder adduction      Shoulder internal rotation      Shoulder external rotation      Middle trapezius      Lower trapezius      Elbow flexion      Elbow extension      Wrist flexion      Wrist extension      Wrist ulnar deviation      Wrist radial deviation      Wrist pronation      Wrist supination      Grip strength (lbs)      (  Blank rows = not tested)       PALPATION:  Denies TTP, no s/s of infection            TODAY'S TREATMENT:  2/23: PROM per protocol, STM Lt upper trap Mobs: inferior & ant to post grade 2 Supine AAROM flexion Seated scap retraction- assisted & with tactile cues Resting posture review Discussed options for exercises in gym  EVAL PT removed bandages and replaced with gauze/tegaderm Upper trap/levator stretch Elbow extension AAROM     PATIENT EDUCATION: Education details: Anatomy of condition, POC, HEP, exercise form/rationale   Person educated: Patient Education method: Explanation, Demonstration, Tactile cues, Verbal cues, and Handouts- text HEP Education comprehension: verbalized understanding, returned demonstration, verbal cues required, tactile cues required, and needs further education     HOME EXERCISE PROGRAM: [id number]   ASSESSMENT:   CLINICAL IMPRESSION: Easily achieving all stated goals in ROM at this point. Sending referral to Dr Bosie Clos. MD arrived to remove stitches and all incisions are well healing.    Objective impairments include decreased activity tolerance, decreased knowledge of  condition, decreased ROM, decreased strength, increased muscle spasms, impaired flexibility, impaired UE functional use, and pain. These impairments are limiting patient from cleaning, community activity, driving, meal prep, occupation, laundry, and shopping. Personal factors including Profession and Time since onset of injury/illness/exacerbation are also affecting patient's functional outcome. Patient will benefit from skilled PT to address above impairments and improve overall function.   REHAB POTENTIAL: Good   CLINICAL DECISION MAKING: Stable/uncomplicated   EVALUATION COMPLEXITY: Low     GOALS: Goals reviewed with patient? Yes   SHORT TERM GOALS:   STG Name Target Date Goal status  1 Pt will demonstrate progression with post op protocol Baseline: will progress as appropriate 12/14/2021 INITIAL  2 Independent in donning/doffing sling Baseline: educated at eval 11/30/2021 INITIAL                                                 LONG TERM GOALS:    LTG Name Target Date Goal status  1 Pt will demo proper form with lifting 10lb to overhead shelf Baseline: 01/25/2022 INITIAL  2 Pt will demo ROM within 10 deg of Rt UE without compensation Baseline:unable at eval 01/25/2022 INITIAL  3 Pt will demo at least 80% strength in Left arm as Rt in dynamometry testing Baseline: unable to test at eval 02/08/2022 INITIAL  4 Pt will begin increased strength training for return work regarding lifting requirements Baseline: will progress as appropriate 02/08/2022 INITIAL  5 Pt will verbalize pain <=2/10 with ADLs Baseline: unable to use Lt UE for ADLs at eval 02/08/2022 INITIAL                      PLAN: PT FREQUENCY: 1-2x/week   PT DURATION: 12 weeks   PLANNED INTERVENTIONS: Therapeutic exercises, Therapeutic activity, Neuro Muscular re-education, Patient/Family education, Joint mobilization, Aquatic Therapy, Dry Needling, Electrical stimulation, Cryotherapy, Moist heat, scar mobilization,  Taping, and Manual therapy   PLAN FOR NEXT SESSION: continue per protocol      Harnoor Reta C. Javis Abboud PT, DPT 11/25/21 7:32 PM

## 2021-11-26 ENCOUNTER — Encounter (HOSPITAL_BASED_OUTPATIENT_CLINIC_OR_DEPARTMENT_OTHER): Payer: Self-pay | Admitting: Orthopaedic Surgery

## 2021-11-26 ENCOUNTER — Encounter (HOSPITAL_BASED_OUTPATIENT_CLINIC_OR_DEPARTMENT_OTHER): Payer: BC Managed Care – PPO | Admitting: Orthopaedic Surgery

## 2021-11-26 ENCOUNTER — Telehealth: Payer: Self-pay | Admitting: Orthopaedic Surgery

## 2021-11-26 NOTE — Telephone Encounter (Signed)
Work note sent to [email or web address]

## 2021-11-26 NOTE — Telephone Encounter (Signed)
Pt called. She needs a note for work stating the surgery she is going to have and how long she will be out and also the recovery process.   CB [phone]

## 2021-12-03 ENCOUNTER — Other Ambulatory Visit: Payer: Self-pay

## 2021-12-03 ENCOUNTER — Encounter (HOSPITAL_BASED_OUTPATIENT_CLINIC_OR_DEPARTMENT_OTHER): Payer: Self-pay | Admitting: Physical Therapy

## 2021-12-03 ENCOUNTER — Ambulatory Visit (HOSPITAL_BASED_OUTPATIENT_CLINIC_OR_DEPARTMENT_OTHER): Payer: BC Managed Care – PPO | Attending: Orthopaedic Surgery | Admitting: Physical Therapy

## 2021-12-03 DIAGNOSIS — M25512 Pain in left shoulder: Secondary | ICD-10-CM | POA: Insufficient documentation

## 2021-12-03 DIAGNOSIS — M6281 Muscle weakness (generalized): Secondary | ICD-10-CM | POA: Diagnosis present

## 2021-12-03 NOTE — Therapy (Signed)
?OUTPATIENT PHYSICAL THERAPY TREATMENT NOTE ? ? ?Patient Name: Rhonda [name] ?MRN: [id number] ?DOB:[birth year], 31 y.o., female ?Today's Date: 12/03/2021 ? ?PCP: Patient, No Pcp Per (Inactive) ?REFERRING PROVIDER: Huel Cote, MD ? ? PT End of Session - 12/03/21 1111   ? ? Visit Number 3   ? Number of Visits 25   ? Date for PT Re-Evaluation 02/11/22   ? Authorization Type BCBS   ? PT Start Time 1110   ? PT Stop Time 1143   ? PT Time Calculation (min) 33 min   ? Activity Tolerance Patient tolerated treatment well   ? Behavior During Therapy [facility] for tasks assessed/performed   ? ?  ?  ? ?  ? ? ?Past Medical History:  ?Diagnosis Date  ? Medical history non-contributory   ? Shoulder injury   ? ?Past Surgical History:  ?Procedure Laterality Date  ? SHOULDER ARTHROSCOPY WITH LABRAL REPAIR Left 11/11/2021  ? Procedure: Left SHOULDER ARTHROSCOPY WITH LABRAL REPAIR;  Surgeon: Huel Cote, MD;  Location: [facility];  Service: Orthopedics;  Laterality: Left;  ? TONSILLECTOMY    ? wisdom teeth    ? ?Patient Active Problem List  ? Diagnosis Date Noted  ? Tear of left glenoid labrum   ? Hyperemesis gravidarum 01/08/2020  ? Leukocytosis 01/08/2020  ? Abnormal uterine bleeding 10/11/2018  ? ? ?REFERRING DIAG: S/p Lt labral repair ? ?THERAPY DIAG:  ?Acute pain of left shoulder ? ?Muscle weakness (generalized) ? ?PERTINENT HISTORY: none ? ?PRECAUTIONS: shoulder, DOS 11/11/21 ? ?SUBJECTIVE: It's a little sore because I am not taking my pain meds anymore ? ?PAIN:  ?Are you having pain? Yes ?NPRS scale: 5/10- soreness ?Pain location: Lt shoulder ? ?Aggravating factors: laying on it ?Relieving factors: meds, ice ? ?OBJECTIVE:  ?  ?PATIENT SURVEYS:  ?FOTO 25 ?  ?COGNITION: ?         Overall cognitive status: Within functional limits for tasks assessed ?                              ?SENSATION: ?         Some tingling in hand ?  ?POSTURE: ?Forward rounded shoulder as expected ?  ?UPPER EXTREMITY AROM/PROM: ?  ?A/PROM  Left ?11/25/2021 Left ?3/3  ?Shoulder flexion 90  90  ?Shoulder extension     ?Shoulder abduction   45  ?Shoulder adduction     ?Shoulder internal rotation     ?Shoulder external rotation   30  ?Elbow flexion     ?Elbow extension     ?Wrist flexion     ?Wrist extension     ?Wrist ulnar deviation     ?Wrist radial deviation     ?Wrist pronation     ?Wrist supination     ?(Blank rows = not tested) ?  ?UPPER EXTREMITY MMT: ?  ?MMT Right ?11/16/2021 Left ?11/16/2021  ?Shoulder flexion      ?Shoulder extension      ?Shoulder abduction      ?Shoulder adduction      ?Shoulder internal rotation      ?Shoulder external rotation      ?Middle trapezius      ?Lower trapezius      ?Elbow flexion      ?Elbow extension      ?Wrist flexion      ?Wrist extension      ?Wrist ulnar deviation      ?Wrist radial  deviation      ?Wrist pronation      ?Wrist supination      ?Grip strength (lbs)      ?(Blank rows = not tested) ?  ?  ?  ?PALPATION:  ?Denies TTP, no s/s of infection ?           ?TODAY'S TREATMENT:  ?3/3: ?MANUAL: PROM all allowed directions, STM Lt upper trap ?Ice 10 min end of session ? ?2/23: ?PROM per protocol, STM Lt upper trap ?Mobs: inferior & ant to post grade 2 ?Supine AAROM flexion ?Seated scap retraction- assisted & with tactile cues ?Resting posture review ?Discussed options for exercises in gym ? ?EVAL ?PT removed bandages and replaced with gauze/tegaderm ?Upper trap/levator stretch ?Elbow extension AAROM ?  ?  ?PATIENT EDUCATION: ?Education details: Anatomy of condition, POC, HEP, exercise form/rationale ?  ?Person educated: Patient ?Education method: Explanation, Demonstration, Tactile cues, Verbal cues, and Handouts- text HEP ?Education comprehension: verbalized understanding, returned demonstration, verbal cues required, tactile cues required, and needs further education ?  ?  ?HOME EXERCISE PROGRAM: ?[id number] ?  ?ASSESSMENT: ?  ?CLINICAL IMPRESSION: ?Continues to progress very well. Empty end feel at allowed  ranges at this point but pt does report some pinching when she activates rather than allowing passive motion. Encouraged her to be patient as we will make a large progression at her next visit when she hits 4 weeks. Burn on Rt hand noted after cooking but no s/s of infection.  ?  ?Objective impairments include decreased activity tolerance, decreased knowledge of condition, decreased ROM, decreased strength, increased muscle spasms, impaired flexibility, impaired UE functional use, and pain. These impairments are limiting patient from cleaning, community activity, driving, meal prep, occupation, laundry, and shopping. Personal factors including Profession and Time since onset of injury/illness/exacerbation are also affecting patient's functional outcome. Patient will benefit from skilled PT to address above impairments and improve overall function. ?  ?REHAB POTENTIAL: Good ?  ?CLINICAL DECISION MAKING: Stable/uncomplicated ?  ?EVALUATION COMPLEXITY: Low ?  ?  ?GOALS: ?Goals reviewed with patient? Yes ?  ?SHORT TERM GOALS: ?  ?STG Name Target Date Goal status  ?1 Pt will demonstrate progression with post op protocol ?Baseline: will progress as appropriate 12/14/2021 INITIAL  ?2 Independent in donning/doffing sling ?Baseline: educated at eval 11/30/2021 INITIAL  ?         ?         ?         ?         ?         ?  ?LONG TERM GOALS:  ?  ?LTG Name Target Date Goal status  ?1 Pt will demo proper form with lifting 10lb to overhead shelf ?Baseline: 01/25/2022 INITIAL  ?2 Pt will demo ROM within 10 deg of Rt UE without compensation ?Baseline:unable at eval 01/25/2022 INITIAL  ?3 Pt will demo at least 80% strength in Left arm as Rt in dynamometry testing ?Baseline: unable to test at eval 02/08/2022 INITIAL  ?4 Pt will begin increased strength training for return work regarding lifting requirements ?Baseline: will progress as appropriate 02/08/2022 INITIAL  ?5 Pt will verbalize pain <=2/10 with ADLs ?Baseline: unable to use Lt UE for  ADLs at eval 02/08/2022 INITIAL  ?         ?         ?  ?PLAN: ?PT FREQUENCY: 1-2x/week ?  ?PT DURATION: 12 weeks ?  ?PLANNED INTERVENTIONS: Therapeutic exercises, Therapeutic activity, Neuro Muscular  re-education, Patient/Family education, Joint mobilization, Aquatic Therapy, Dry Needling, Electrical stimulation, Cryotherapy, Moist heat, scar mobilization, Taping, and Manual therapy ?  ?PLAN FOR NEXT SESSION: continue per protocol ?  ? ? ? ?Ersie Savino C. Kaetlin Bullen PT, DPT ?12/03/21 11:42 AM ? ? ?  ? ?

## 2021-12-09 NOTE — Therapy (Signed)
?OUTPATIENT PHYSICAL THERAPY TREATMENT NOTE ? ? ?Patient Name: Rhonda [name] ?MRN: [id number] ?DOB:1991/01/05, 31 y.o., female ?Today's Date: 12/10/2021 ? ?PCP: Patient, No Pcp Per (Inactive) ?REFERRING PROVIDER: Huel CoteBokshan, Steven, MD ? ? PT End of Session - 12/10/21 1110   ? ? Visit Number 4   ? Number of Visits 25   ? Date for PT Re-Evaluation 02/11/22   ? Authorization Type BCBS   ? PT Start Time 1108   ? PT Stop Time 1142   ? PT Time Calculation (min) 34 min   ? Activity Tolerance Patient tolerated treatment well   ? [facility] for tasks assessed/performed   ? ?  ?  ? ?  ? ? ? ?Past Medical History:  ?Diagnosis Date  ? Medical history non-contributory   ? Shoulder injury   ? ?Past Surgical History:  ?Procedure Laterality Date  ? SHOULDER ARTHROSCOPY WITH LABRAL REPAIR Left 11/11/2021  ? Procedure: Left SHOULDER ARTHROSCOPY WITH LABRAL REPAIR;  Surgeon: Huel CoteBokshan, Steven, MD;  Location: [facility];  Service: Orthopedics;  Laterality: Left;  ? TONSILLECTOMY    ? wisdom teeth    ? ?Patient Active Problem List  ? Diagnosis Date Noted  ? Tear of left glenoid labrum   ? Hyperemesis gravidarum 01/08/2020  ? Leukocytosis 01/08/2020  ? Abnormal uterine bleeding 10/11/2018  ? ? ?REFERRING DIAG: S/p Lt labral repair ? ?THERAPY DIAG:  ?Acute pain of left shoulder ? ?Muscle weakness (generalized) ? ?PERTINENT HISTORY: none ? ?PRECAUTIONS: shoulder, DOS 11/11/21 ? ?SUBJECTIVE: no pain really, just if I twist it. Stiffness in upper shoulder.  ? ? ?PAIN:  ?Are you having pain? No ?NPRS scale:0/10 ?Pain location: Lt shoulder ? ?Aggravating factors: laying on it ?Relieving factors: meds, ice ? ?OBJECTIVE:  ?  ?PATIENT SURVEYS:  ?FOTO 25 ?  ?COGNITION: ?         Overall cognitive status: Within functional limits for tasks assessed ?                              ?SENSATION: ?         Some tingling in hand ?  ?POSTURE: ?Forward rounded shoulder as expected ?  ?UPPER EXTREMITY AROM/PROM: ?  ?A/PROM Left ?11/25/2021  Left ?3/3  ?Shoulder flexion 90  90  ?Shoulder extension     ?Shoulder abduction   45  ?Shoulder adduction     ?Shoulder internal rotation     ?Shoulder external rotation   30  ?Elbow flexion     ?Elbow extension     ?Wrist flexion     ?Wrist extension     ?Wrist ulnar deviation     ?Wrist radial deviation     ?Wrist pronation     ?Wrist supination     ?(Blank rows = not tested) ?  ?UPPER EXTREMITY MMT: ?  ?MMT Right ?11/16/2021 Left ?11/16/2021  ?Shoulder flexion      ?Shoulder extension      ?Shoulder abduction      ?Shoulder adduction      ?Shoulder internal rotation      ?Shoulder external rotation      ?Middle trapezius      ?Lower trapezius      ?Elbow flexion      ?Elbow extension      ?Wrist flexion      ?Wrist extension      ?Wrist ulnar deviation      ?Wrist radial  deviation      ?Wrist pronation      ?Wrist supination      ?Grip strength (lbs)      ?(Blank rows = not tested) ?  ?  ?  ?PALPATION:  ?Denies TTP, no s/s of infection ?           ?TODAY'S TREATMENT:  ?3/10: ?MANUAL: PROM & STM to upper trap ?Supine shoulder circles at 90 flexion ?Standing AAROM shoulder flexion to 90 with small lift off ?Yellow tband anchored: row, IR, ER, bil shoulder extension ?PT resisted scapular protraction/retraction ? ? ?3/3: ?MANUAL: PROM all allowed directions, STM Lt upper trap ?Ice 10 min end of session ? ?2/23: ?PROM per protocol, STM Lt upper trap ?Mobs: inferior & ant to post grade 2 ?Supine AAROM flexion ?Seated scap retraction- assisted & with tactile cues ?Resting posture review ?Discussed options for exercises in gym ? ?  ?  ?PATIENT EDUCATION: ?Education details:exercise form/rationale ?  ?Person educated: Patient ?Education method: Explanation, Demonstration, Tactile cues, Verbal cues, and Handouts- text HEP ?Education comprehension: verbalized understanding, returned demonstration, verbal cues required, tactile cues required, and needs further education ?  ?  ?HOME EXERCISE PROGRAM: ?[id number] ?   ?ASSESSMENT: ?  ?CLINICAL IMPRESSION: ?Added active stability exercises as well as light band work. Excellent tolerance with fatigue as expected. Tends to overuse upper trap but is becoming aware.  ?  ?Objective impairments include decreased activity tolerance, decreased knowledge of condition, decreased ROM, decreased strength, increased muscle spasms, impaired flexibility, impaired UE functional use, and pain. These impairments are limiting patient from cleaning, community activity, driving, meal prep, occupation, laundry, and shopping. Personal factors including Profession and Time since onset of injury/illness/exacerbation are also affecting patient's functional outcome. Patient will benefit from skilled PT to address above impairments and improve overall function. ?  ?REHAB POTENTIAL: Good ?  ?CLINICAL DECISION MAKING: Stable/uncomplicated ?  ?EVALUATION COMPLEXITY: Low ?  ?  ?GOALS: ?Goals reviewed with patient? Yes ?  ?SHORT TERM GOALS: ?  ?STG Name Target Date Goal status  ?1 Pt will demonstrate progression with post op protocol ?Baseline: will progress as appropriate 12/14/2021 INITIAL  ?2 Independent in donning/doffing sling ?Baseline: educated at eval 11/30/2021 achieved  ?         ?         ?         ?         ?         ?  ?LONG TERM GOALS:  ?  ?LTG Name Target Date Goal status  ?1 Pt will demo proper form with lifting 10lb to overhead shelf ?Baseline: 01/25/2022 INITIAL  ?2 Pt will demo ROM within 10 deg of Rt UE without compensation ?Baseline:unable at eval 01/25/2022 INITIAL  ?3 Pt will demo at least 80% strength in Left arm as Rt in dynamometry testing ?Baseline: unable to test at eval 02/08/2022 INITIAL  ?4 Pt will begin increased strength training for return work regarding lifting requirements ?Baseline: will progress as appropriate 02/08/2022 INITIAL  ?5 Pt will verbalize pain <=2/10 with ADLs ?Baseline: unable to use Lt UE for ADLs at eval 02/08/2022 INITIAL  ?         ?         ?  ?PLAN: ?PT FREQUENCY:  1-2x/week ?  ?PT DURATION: 12 weeks ?  ?PLANNED INTERVENTIONS: Therapeutic exercises, Therapeutic activity, Neuro Muscular re-education, Patient/Family education, Joint mobilization, Aquatic Therapy, Dry Needling, Electrical stimulation, Cryotherapy, Moist heat, scar mobilization, Taping,  and Manual therapy ?  ?PLAN FOR NEXT SESSION: continue per protocol ?  ? ? ? ?Chaddrick Brue C. Jowell Bossi PT, DPT ?12/10/21 11:43 AM ? ? ?  ?

## 2021-12-10 ENCOUNTER — Encounter (HOSPITAL_BASED_OUTPATIENT_CLINIC_OR_DEPARTMENT_OTHER): Payer: Self-pay | Admitting: Physical Therapy

## 2021-12-10 ENCOUNTER — Ambulatory Visit (HOSPITAL_BASED_OUTPATIENT_CLINIC_OR_DEPARTMENT_OTHER): Payer: BC Managed Care – PPO | Admitting: Physical Therapy

## 2021-12-10 ENCOUNTER — Other Ambulatory Visit: Payer: Self-pay

## 2021-12-10 DIAGNOSIS — M25512 Pain in left shoulder: Secondary | ICD-10-CM

## 2021-12-10 DIAGNOSIS — M6281 Muscle weakness (generalized): Secondary | ICD-10-CM

## 2021-12-14 ENCOUNTER — Ambulatory Visit (HOSPITAL_BASED_OUTPATIENT_CLINIC_OR_DEPARTMENT_OTHER): Payer: BC Managed Care – PPO | Admitting: Physical Therapy

## 2021-12-14 ENCOUNTER — Other Ambulatory Visit: Payer: Self-pay

## 2021-12-14 ENCOUNTER — Encounter (HOSPITAL_BASED_OUTPATIENT_CLINIC_OR_DEPARTMENT_OTHER): Payer: Self-pay | Admitting: Physical Therapy

## 2021-12-14 DIAGNOSIS — M25512 Pain in left shoulder: Secondary | ICD-10-CM

## 2021-12-14 DIAGNOSIS — M6281 Muscle weakness (generalized): Secondary | ICD-10-CM

## 2021-12-14 NOTE — Therapy (Signed)
?OUTPATIENT PHYSICAL THERAPY TREATMENT NOTE ? ? ?Patient Name: Rhonda [name] ?MRN: [id number] ?DOB:October 09, 1990, 31 y.o., female ?Today's Date: 12/14/2021 ? ?PCP: Patient, No Pcp Per (Inactive) ?REFERRING PROVIDER: [name], MD ? ? PT End of Session - 12/14/21 1100   ? ? Visit Number 5   ? Number of Visits 25   ? Date for PT Re-Evaluation 02/11/22   ? Authorization Type BCBS   ? PT Start Time 1058   ? PT Stop Time 1133   ? PT Time Calculation (min) 35 min   ? Activity Tolerance Patient tolerated treatment well   ? Behavior During Therapy [facility] for tasks assessed/performed   ? ?  ?  ? ?  ? ? ? ?Past Medical History:  ?Diagnosis Date  ? Medical history non-contributory   ? Shoulder injury   ? ?Past Surgical History:  ?Procedure Laterality Date  ? SHOULDER ARTHROSCOPY WITH LABRAL REPAIR Left 11/11/2021  ? Procedure: Left SHOULDER ARTHROSCOPY WITH LABRAL REPAIR;  Surgeon: [name], MD;  Location: [place];  Service: Orthopedics;  Laterality: Left;  ? TONSILLECTOMY    ? wisdom teeth    ? ?Patient Active Problem List  ? Diagnosis Date Noted  ? Tear of left glenoid labrum   ? Hyperemesis gravidarum 01/08/2020  ? Leukocytosis 01/08/2020  ? Abnormal uterine bleeding 10/11/2018  ? ? ?REFERRING DIAG: S/p Lt labral repair ? ?THERAPY DIAG:  ?Acute pain of left shoulder ? ?Muscle weakness (generalized) ? ?PERTINENT HISTORY: none ? ?PRECAUTIONS: shoulder, DOS 11/11/21 ? ?SUBJECTIVE: was sore after last visit but to be expected. Did have some discomfort this AM.  ? ? ?PAIN:  ?Are you having pain? No ?NPRS scale:0/10 ?Pain location: Lt shoulder ? ?Aggravating factors: laying on it ?Relieving factors: meds, ice ? ?OBJECTIVE:  ?  ?PATIENT SURVEYS:  ?FOTO 25 ?  ?COGNITION: ?         Overall cognitive status: Within functional limits for tasks assessed ?                              ?SENSATION: ?         Some tingling in hand ?  ?POSTURE: ?Forward rounded shoulder as expected ?  ?UPPER EXTREMITY AROM/PROM: ?  ?A/PROM  Left ?11/25/2021 Left ?3/3  ?Shoulder flexion 90  90  ?Shoulder extension     ?Shoulder abduction   45  ?Shoulder adduction     ?Shoulder internal rotation     ?Shoulder external rotation   30  ?Elbow flexion     ?Elbow extension     ?Wrist flexion     ?Wrist extension     ?Wrist ulnar deviation     ?Wrist radial deviation     ?Wrist pronation     ?Wrist supination     ?(Blank rows = not tested) ?  ?UPPER EXTREMITY MMT: ?  ?MMT Right ?11/16/2021 Left ?11/16/2021  ?Shoulder flexion      ?Shoulder extension      ?Shoulder abduction      ?Shoulder adduction      ?Shoulder internal rotation      ?Shoulder external rotation      ?Middle trapezius      ?Lower trapezius      ?Elbow flexion      ?Elbow extension      ?Wrist flexion      ?Wrist extension      ?Wrist ulnar deviation      ?  Wrist radial deviation      ?Wrist pronation      ?Wrist supination      ?Grip strength (lbs)      ?(Blank rows = not tested) ?  ?  ?  ?PALPATION:  ?Denies TTP, no s/s of infection ?           ?TODAY'S TREATMENT:  ?3/14: ?MANUAL: PROM with STM, end range flexion inf mobs, AP mobs at neutral ?Wall walk with liftoff- hold 2 breaths ?Seated triceps kick 1lb, cues for scapular control ?Seated AROM- flexion to 90, extension, abduction to 90 ? ?3/10: ?MANUAL: PROM & STM to upper trap ?Supine shoulder circles at 90 flexion ?Standing AAROM shoulder flexion to 90 with small lift off ?Yellow tband anchored: row, IR, ER, bil shoulder extension ?PT resisted scapular protraction/retraction ? ? ?3/3: ?MANUAL: PROM all allowed directions, STM Lt upper trap ?Ice 10 min end of session ? ? ?  ?  ?PATIENT EDUCATION: ?Education details:exercise form/rationale ?  ?Person educated: Patient ?Education method: Explanation, Demonstration, Tactile cues, Verbal cues, and Handouts- text HEP ?Education comprehension: verbalized understanding, returned demonstration, verbal cues required, tactile cues required, and needs further education ?  ?  ?HOME EXERCISE  PROGRAM: ?[id number] ?  ?ASSESSMENT: ?  ?CLINICAL IMPRESSION: ?Continued to progress stability in AROM to 90 deg and cues for scapular control. She is progressing as appropriate and will now increase POC to 2/week.  ?  ?Objective impairments include decreased activity tolerance, decreased knowledge of condition, decreased ROM, decreased strength, increased muscle spasms, impaired flexibility, impaired UE functional use, and pain. These impairments are limiting patient from cleaning, community activity, driving, meal prep, occupation, laundry, and shopping. Personal factors including Profession and Time since onset of injury/illness/exacerbation are also affecting patient's functional outcome. Patient will benefit from skilled PT to address above impairments and improve overall function. ?  ?REHAB POTENTIAL: Good ?  ?CLINICAL DECISION MAKING: Stable/uncomplicated ?  ?EVALUATION COMPLEXITY: Low ?  ?  ?GOALS: ?Goals reviewed with patient? Yes ?  ?SHORT TERM GOALS: ?  ?STG Name Target Date Goal status  ?1 Pt will demonstrate progression with post op protocol ?Baseline: will progress as appropriate 12/14/2021 achieved  ?2 Independent in donning/doffing sling ?Baseline: educated at eval 11/30/2021 achieved  ?         ?         ?         ?         ?         ?  ?LONG TERM GOALS:  ?  ?LTG Name Target Date Goal status  ?1 Pt will demo proper form with lifting 10lb to overhead shelf ?Baseline: 01/25/2022 INITIAL  ?2 Pt will demo ROM within 10 deg of Rt UE without compensation ?Baseline:unable at eval 01/25/2022 INITIAL  ?3 Pt will demo at least 80% strength in Left arm as Rt in dynamometry testing ?Baseline: unable to test at eval 02/08/2022 INITIAL  ?4 Pt will begin increased strength training for return work regarding lifting requirements ?Baseline: will progress as appropriate 02/08/2022 INITIAL  ?5 Pt will verbalize pain <=2/10 with ADLs ?Baseline: unable to use Lt UE for ADLs at eval 02/08/2022 INITIAL  ?         ?         ?   ?PLAN: ?PT FREQUENCY: 1-2x/week ?  ?PT DURATION: 12 weeks ?  ?PLANNED INTERVENTIONS: Therapeutic exercises, Therapeutic activity, Neuro Muscular re-education, Patient/Family education, Joint mobilization, Aquatic Therapy, Dry Needling, Electrical stimulation,  Cryotherapy, Moist heat, scar mobilization, Taping, and Manual therapy ?  ?PLAN FOR NEXT SESSION: continue per protocol ?  ? ? ? ?Benjamine Strout C. Cleavon Goldman PT, DPT ?12/14/21 11:38 AM ? ? ?  ?

## 2021-12-16 ENCOUNTER — Ambulatory Visit (HOSPITAL_BASED_OUTPATIENT_CLINIC_OR_DEPARTMENT_OTHER): Payer: BC Managed Care – PPO | Admitting: Physical Therapy

## 2021-12-16 ENCOUNTER — Other Ambulatory Visit: Payer: Self-pay

## 2021-12-16 DIAGNOSIS — M25512 Pain in left shoulder: Secondary | ICD-10-CM | POA: Diagnosis not present

## 2021-12-16 NOTE — Therapy (Signed)
?OUTPATIENT PHYSICAL THERAPY TREATMENT NOTE ? ? ?Patient Name: Rhonda [name] ?MRN: [id number] ?DOB:1990/11/29, 31 y.o., female ?Today's Date: 12/16/2021 ? ?PCP: Patient, No Pcp Per (Inactive) ?REFERRING PROVIDER: Huel Cote, MD ? ? PT End of Session - 12/16/21 1421   ? ? Visit Number 6   ? Number of Visits 25   ? Date for PT Re-Evaluation 02/11/22   ? Authorization Type BCBS   ? PT Start Time 1359   ? PT Stop Time 1425   ? PT Time Calculation (min) 26 min   ? Activity Tolerance Patient tolerated treatment well   ? Behavior During Therapy [facility] for tasks assessed/performed   ? ?  ?  ? ?  ? ? ? ? ?Past Medical History:  ?Diagnosis Date  ? Medical history non-contributory   ? Shoulder injury   ? ?Past Surgical History:  ?Procedure Laterality Date  ? SHOULDER ARTHROSCOPY WITH LABRAL REPAIR Left 11/11/2021  ? Procedure: Left SHOULDER ARTHROSCOPY WITH LABRAL REPAIR;  Surgeon: Huel Cote, MD;  Location: [facility];  Service: Orthopedics;  Laterality: Left;  ? TONSILLECTOMY    ? wisdom teeth    ? ?Patient Active Problem List  ? Diagnosis Date Noted  ? Tear of left glenoid labrum   ? Hyperemesis gravidarum 01/08/2020  ? Leukocytosis 01/08/2020  ? Abnormal uterine bleeding 10/11/2018  ? ? ?REFERRING DIAG: S/p Lt labral repair ? ?THERAPY DIAG:  ?Acute pain of left shoulder ? ?Muscle weakness (generalized) ? ?PERTINENT HISTORY: none ? ?PRECAUTIONS: shoulder, DOS 11/11/21 ? ?SUBJECTIVE:  Pt states she the shoulder has some numbness but she does not have pain. She states she has had lip numbness as well.  ? ? ?PAIN:  ?Are you having pain? No ?NPRS scale:0/10 ?Pain location: Lt shoulder ? ?Aggravating factors: laying on it ?Relieving factors: meds, ice ? ?OBJECTIVE:  ?  ?PATIENT SURVEYS:  ?FOTO 25 ?  ?COGNITION: ?         Overall cognitive status: Within functional limits for tasks assessed ?                              ?SENSATION: ?         Some tingling in hand ?  ?POSTURE: ?Forward rounded shoulder as expected ?   ?UPPER EXTREMITY AROM/PROM: ?  ?A/PROM Left ?11/25/2021 Left ?3/3  ?Shoulder flexion 90  90  ?Shoulder extension     ?Shoulder abduction   45  ?Shoulder adduction     ?Shoulder internal rotation     ?Shoulder external rotation   30  ?Elbow flexion     ?Elbow extension     ?Wrist flexion     ?Wrist extension     ?Wrist ulnar deviation     ?Wrist radial deviation     ?Wrist pronation     ?Wrist supination     ?(Blank rows = not tested) ?  ?UPPER EXTREMITY MMT: ?  ?MMT Right ?11/16/2021 Left ?11/16/2021  ?Shoulder flexion      ?Shoulder extension      ?Shoulder abduction      ?Shoulder adduction      ?Shoulder internal rotation      ?Shoulder external rotation      ?Middle trapezius      ?Lower trapezius      ?Elbow flexion      ?Elbow extension      ?Wrist flexion      ?Wrist extension      ?  Wrist ulnar deviation      ?Wrist radial deviation      ?Wrist pronation      ?Wrist supination      ?Grip strength (lbs)      ?(Blank rows = not tested) ?  ?  ?  ?PALPATION:  ?Denies TTP, no s/s of infection ?           ?TODAY'S TREATMENT:  ? ?3/16: ?Joint mobilization with ABD and inf; grade III inf and post mob ? ?PROM with STM, end range flexion inf mobs, AP mobs at neutral ? ?Self AAROM supine flexion 3s 10x ?AROM flexion and ABD 10x past 90 ?Table ABD 3s 10x ? ? ? ?3/14: ?MANUAL: PROM with STM, end range flexion inf mobs, AP mobs at neutral ?Wall walk with liftoff- hold 2 breaths ?Seated triceps kick 1lb, cues for scapular control ?Seated AROM- flexion to 90, extension, abduction to 90 ? ?3/10: ?MANUAL: PROM & STM to upper trap ?Supine shoulder circles at 90 flexion ?Standing AAROM shoulder flexion to 90 with small lift off ?Yellow tband anchored: row, IR, ER, bil shoulder extension ?PT resisted scapular protraction/retraction ? ? ?3/3: ?MANUAL: PROM all allowed directions, STM Lt upper trap ?Ice 10 min end of session ? ? ?  ?  ?PATIENT EDUCATION: ?Education details:exercise form/rationale ?  ?Person educated:  Patient ?Education method: Explanation, Demonstration, Tactile cues, Verbal cues, and Handouts- text HEP ?Education comprehension: verbalized understanding, returned demonstration, verbal cues required, tactile cues required, and needs further education ?  ?  ?HOME EXERCISE PROGRAM: ?[id number] ?  ?ASSESSMENT: ?  ?CLINICAL IMPRESSION: ?Pt arrives late to session. Pt was able to tolerate increase in PROM per protocol limits in 4-8wk phase. Flexion was up to 110, ABD up to 100. Plan to continue per protocol at next session. Pt able to tolerate increased ROM without pain. FOTO at next session, given time constraints.  ?  ?Objective impairments include decreased activity tolerance, decreased knowledge of condition, decreased ROM, decreased strength, increased muscle spasms, impaired flexibility, impaired UE functional use, and pain. These impairments are limiting patient from cleaning, community activity, driving, meal prep, occupation, laundry, and shopping. Personal factors including Profession and Time since onset of injury/illness/exacerbation are also affecting patient's functional outcome. Patient will benefit from skilled PT to address above impairments and improve overall function. ?  ?REHAB POTENTIAL: Good ?  ?CLINICAL DECISION MAKING: Stable/uncomplicated ?  ?EVALUATION COMPLEXITY: Low ?  ?  ?GOALS: ?Goals reviewed with patient? Yes ?  ?SHORT TERM GOALS: ?  ?STG Name Target Date Goal status  ?1 Pt will demonstrate progression with post op protocol ?Baseline: will progress as appropriate 12/14/2021 achieved  ?2 Independent in donning/doffing sling ?Baseline: educated at eval 11/30/2021 achieved  ?         ?         ?         ?         ?         ?  ?LONG TERM GOALS:  ?  ?LTG Name Target Date Goal status  ?1 Pt will demo proper form with lifting 10lb to overhead shelf ?Baseline: 01/25/2022 INITIAL  ?2 Pt will demo ROM within 10 deg of Rt UE without compensation ?Baseline:unable at eval 01/25/2022 INITIAL  ?3 Pt will  demo at least 80% strength in Left arm as Rt in dynamometry testing ?Baseline: unable to test at eval 02/08/2022 INITIAL  ?4 Pt will begin increased strength training for return work regarding lifting  requirements ?Baseline: will progress as appropriate 02/08/2022 INITIAL  ?5 Pt will verbalize pain <=2/10 with ADLs ?Baseline: unable to use Lt UE for ADLs at eval 02/08/2022 INITIAL  ?         ?         ?  ?PLAN: ?PT FREQUENCY: 1-2x/week ?  ?PT DURATION: 12 weeks ?  ?PLANNED INTERVENTIONS: Therapeutic exercises, Therapeutic activity, Neuro Muscular re-education, Patient/Family education, Joint mobilization, Aquatic Therapy, Dry Needling, Electrical stimulation, Cryotherapy, Moist heat, scar mobilization, Taping, and Manual therapy ?  ?PLAN FOR NEXT SESSION: continue per protocol, FOTO ?  ? ?Zebedee Iba PT, DPT ?12/16/21 2:26 PM ? ? ?  ?

## 2021-12-21 ENCOUNTER — Other Ambulatory Visit: Payer: Self-pay

## 2021-12-21 ENCOUNTER — Ambulatory Visit (HOSPITAL_BASED_OUTPATIENT_CLINIC_OR_DEPARTMENT_OTHER): Payer: BC Managed Care – PPO | Admitting: Physical Therapy

## 2021-12-21 ENCOUNTER — Encounter (HOSPITAL_BASED_OUTPATIENT_CLINIC_OR_DEPARTMENT_OTHER): Payer: Self-pay | Admitting: Physical Therapy

## 2021-12-21 DIAGNOSIS — M25512 Pain in left shoulder: Secondary | ICD-10-CM

## 2021-12-21 DIAGNOSIS — M6281 Muscle weakness (generalized): Secondary | ICD-10-CM

## 2021-12-21 NOTE — Therapy (Signed)
?OUTPATIENT PHYSICAL THERAPY TREATMENT NOTE ? ? ?Patient Name: Rhonda [name] ?MRN: [id number] ?DOB:01-28-1991, 31 y.o., female ?Today's Date: 12/21/2021 ? ?PCP: Patient, No Pcp Per (Inactive) ?REFERRING PROVIDER: [name], MD ? ? PT End of Session - 12/21/21 1117   ? ? Visit Number 7   ? Number of Visits 25   ? Date for PT Re-Evaluation 02/11/22   ? Authorization Type BCBS   ? PT Start Time 1100   ? PT Stop Time [id number]   ? PT Time Calculation (min) 38 min   ? Activity Tolerance Patient tolerated treatment well   ? Behavior During Therapy [facility] for tasks assessed/performed   ? ?  ?  ? ?  ? ? ? ? ? ?Past Medical History:  ?Diagnosis Date  ? Medical history non-contributory   ? Shoulder injury   ? ?Past Surgical History:  ?Procedure Laterality Date  ? SHOULDER ARTHROSCOPY WITH LABRAL REPAIR Left 11/11/2021  ? Procedure: Left SHOULDER ARTHROSCOPY WITH LABRAL REPAIR;  Surgeon: [name], MD;  Location: [place];  Service: Orthopedics;  Laterality: Left;  ? TONSILLECTOMY    ? wisdom teeth    ? ?Patient Active Problem List  ? Diagnosis Date Noted  ? Tear of left glenoid labrum   ? Hyperemesis gravidarum 01/08/2020  ? Leukocytosis 01/08/2020  ? Abnormal uterine bleeding 10/11/2018  ? ? ?REFERRING DIAG: S/p Lt labral repair ? ?THERAPY DIAG:  ?Acute pain of left shoulder ? ?Muscle weakness (generalized) ? ?PERTINENT HISTORY: none ? ?PRECAUTIONS: shoulder, DOS 11/11/21 ? ?SUBJECTIVE:  Pt states she was a little sore after last session but had no pain.  ? ? ?PAIN:  ?Are you having pain? No ?NPRS scale:0/10 ?Pain location: Lt shoulder ? ?Aggravating factors: laying on it ?Relieving factors: meds, ice ? ?OBJECTIVE:  ?  ?PATIENT SURVEYS:  ?FOTO 25 ?  ? ?7th Visit            ?TODAY'S TREATMENT:  ? ?3/21: ?Joint mobilization with ABD and inf; grade III inf and post mob ? ?PROM to protocol limits ? ?Self AAROM supine flexion 3s 10x ?AROM flexion and ABD 2x10 each ?Table ABD 3s 10x ?Prone scap retraction/row  3x10 ?Prone T 2x10 ? ?3/16: ?Joint mobilization with ABD and inf; grade III inf and post mob ? ?PROM with STM, end range flexion inf mobs, AP mobs at neutral ? ?Self AAROM supine flexion 3s 10x ?AROM flexion and ABD 10x past 90 ?Table ABD 3s 10x ? ? ? ?3/14: ?MANUAL: PROM with STM, end range flexion inf mobs, AP mobs at neutral ?Wall walk with liftoff- hold 2 breaths ?Seated triceps kick 1lb, cues for scapular control ?Seated AROM- flexion to 90, extension, abduction to 90 ? ?3/10: ?MANUAL: PROM & STM to upper trap ?Supine shoulder circles at 90 flexion ?Standing AAROM shoulder flexion to 90 with small lift off ?Yellow tband anchored: row, IR, ER, bil shoulder extension ?PT resisted scapular protraction/retraction ? ? ?3/3: ?MANUAL: PROM all allowed directions, STM Lt upper trap ?Ice 10 min end of session ? ? ?  ?  ?PATIENT EDUCATION: ?Education details:exercise form/rationale ?  ?Person educated: Patient ?Education method: Explanation, Demonstration, Tactile cues, Verbal cues, and Handouts- text HEP ?Education comprehension: verbalized understanding, returned demonstration, verbal cues required, tactile cues required, and needs further education ?  ?  ?HOME EXERCISE PROGRAM: ?Access Code: [email or web address] ?URL: [email or web address] ?Date: 12/21/2021 ?Prepared by: Daleen Bo ? ?Exercises ?Seated Shoulder Full Can AROM - 2 x daily - 7 x weekly - 1  sets - 10 reps - 2 breaths hold ?Seated Shoulder Flexion - 2 x daily - 7 x weekly - 1 sets - 10 reps - 2 breaths hold ?Prone Shoulder Horizontal Abduction - 1 x daily - 7 x weekly - 3 sets - 10 reps ?Prone Shoulder Row - 1 x daily - 7 x weekly - 3 sets - 10 reps ? ?  ?ASSESSMENT: ?  ?CLINICAL IMPRESSION: ?Pt with significant improvement in A/PROM at today's session with L shoulder. Pt able to comfortably reach protocol limits with ROM without pain or increased discomfort. Pt does have report of tightness at end range. Pt able to incorporate scapular activation  exercise today against gravity with good scapular control. Pt is nearly 6 wks at this time and will be seeing MD on Friday. Plan to continue per protocol. HEP updated at this time to include scapular exercise.  ?  ?Objective impairments include decreased activity tolerance, decreased knowledge of condition, decreased ROM, decreased strength, increased muscle spasms, impaired flexibility, impaired UE functional use, and pain. These impairments are limiting patient from cleaning, community activity, driving, meal prep, occupation, laundry, and shopping. Personal factors including Profession and Time since onset of injury/illness/exacerbation are also affecting patient's functional outcome. Patient will benefit from skilled PT to address above impairments and improve overall function. ?  ?REHAB POTENTIAL: Good ?  ?CLINICAL DECISION MAKING: Stable/uncomplicated ?  ?EVALUATION COMPLEXITY: Low ?  ?  ?GOALS: ?Goals reviewed with patient? Yes ?  ?SHORT TERM GOALS: ?  ?STG Name Target Date Goal status  ?1 Pt will demonstrate progression with post op protocol ?Baseline: will progress as appropriate 12/14/2021 achieved  ?2 Independent in donning/doffing sling ?Baseline: educated at eval 11/30/2021 achieved  ?         ?         ?         ?         ?         ?  ?LONG TERM GOALS:  ?  ?LTG Name Target Date Goal status  ?1 Pt will demo proper form with lifting 10lb to overhead shelf ?Baseline: 01/25/2022 INITIAL  ?2 Pt will demo ROM within 10 deg of Rt UE without compensation ?Baseline:unable at eval 01/25/2022 INITIAL  ?3 Pt will demo at least 80% strength in Left arm as Rt in dynamometry testing ?Baseline: unable to test at eval 02/08/2022 INITIAL  ?4 Pt will begin increased strength training for return work regarding lifting requirements ?Baseline: will progress as appropriate 02/08/2022 INITIAL  ?5 Pt will verbalize pain <=2/10 with ADLs ?Baseline: unable to use Lt UE for ADLs at eval 02/08/2022 INITIAL  ?         ?         ?  ?PLAN: ?PT  FREQUENCY: 1-2x/week ?  ?PT DURATION: 12 weeks ?  ?PLANNED INTERVENTIONS: Therapeutic exercises, Therapeutic activity, Neuro Muscular re-education, Patient/Family education, Joint mobilization, Aquatic Therapy, Dry Needling, Electrical stimulation, Cryotherapy, Moist heat, scar mobilization, Taping, and Manual therapy ?  ?PLAN FOR NEXT SESSION: continue per protocol ?  ? ?Daleen Bo PT, DPT ?12/21/21 11:38 AM ? ? ?  ?

## 2021-12-23 ENCOUNTER — Ambulatory Visit (HOSPITAL_BASED_OUTPATIENT_CLINIC_OR_DEPARTMENT_OTHER): Payer: BC Managed Care – PPO | Admitting: Physical Therapy

## 2021-12-23 ENCOUNTER — Encounter (HOSPITAL_BASED_OUTPATIENT_CLINIC_OR_DEPARTMENT_OTHER): Payer: Self-pay | Admitting: Physical Therapy

## 2021-12-23 ENCOUNTER — Other Ambulatory Visit: Payer: Self-pay

## 2021-12-23 DIAGNOSIS — M6281 Muscle weakness (generalized): Secondary | ICD-10-CM

## 2021-12-23 DIAGNOSIS — M25512 Pain in left shoulder: Secondary | ICD-10-CM | POA: Diagnosis not present

## 2021-12-23 NOTE — Therapy (Signed)
?OUTPATIENT PHYSICAL THERAPY TREATMENT NOTE ? ? ?Patient Name: Rhonda [name] ?MRN: [id number] ?DOB:[birth year], 31 y.o., female ?Today's Date: 12/23/2021 ? ?PCP: Patient, No Pcp Per (Inactive) ?REFERRING PROVIDER: Huel Cote, MD ? ? PT End of Session - 12/23/21 1018   ? ? Visit Number 8   ? Number of Visits 25   ? Date for PT Re-Evaluation 02/11/22   ? Authorization Type BCBS   ? PT Start Time 1018   ? ?  ?  ? ?  ? ? ? ? ? ?Past Medical History:  ?Diagnosis Date  ? Medical history non-contributory   ? Shoulder injury   ? ?Past Surgical History:  ?Procedure Laterality Date  ? SHOULDER ARTHROSCOPY WITH LABRAL REPAIR Left 11/11/2021  ? Procedure: Left SHOULDER ARTHROSCOPY WITH LABRAL REPAIR;  Surgeon: Huel Cote, MD;  Location: [facility];  Service: Orthopedics;  Laterality: Left;  ? TONSILLECTOMY    ? wisdom teeth    ? ?Patient Active Problem List  ? Diagnosis Date Noted  ? Tear of left glenoid labrum   ? Hyperemesis gravidarum 01/08/2020  ? Leukocytosis 01/08/2020  ? Abnormal uterine bleeding 10/11/2018  ? ? ?REFERRING DIAG: S/p Lt labral repair ? ?THERAPY DIAG:  ?Acute pain of left shoulder ? ?Muscle weakness (generalized) ? ?PERTINENT HISTORY: none ? ?PRECAUTIONS: shoulder, DOS 11/11/21 ? ?SUBJECTIVE:  Overall feeling good. Denies N/t in UE. Neck stiffness as usual with sling.  ? ? ?PAIN:  ?Are you having pain? No ?NPRS scale:0/10 ?Pain location: Lt shoulder ? ?Aggravating factors: laying on it ?Relieving factors: meds, ice ? ?OBJECTIVE:  ?  ?PATIENT SURVEYS:  ?FOTO 25 ? ?3/23: Shoulder flexion supine: 150  standing: 134 ? ?         ?TODAY'S TREATMENT:  ? ?3/23: ?MANUAL: passive stretching; STM Lt upper traps and cervical paraspinals; A/P and inf mobs ?Supine flexion/ext 90-120 1lb weight, both arms moving ?Supine ABCs at 90, larger range without pain ?Bent over row & triceps kick 1lb weights ?Wall walk with liftoff ? ?3/21: ?Joint mobilization with ABD and inf; grade III inf and post mob ? ?PROM to  protocol limits ? ?Self AAROM supine flexion 3s 10x ?AROM flexion and ABD 2x10 each ?Table ABD 3s 10x ?Prone scap retraction/row 3x10 ?Prone T 2x10 ? ? ?  ?  ?PATIENT EDUCATION: ?Education details:exercise form/rationale ?  ?Person educated: Patient ?Education method: Explanation, Demonstration, Tactile cues, Verbal cues, and Handouts- text HEP ?Education comprehension: verbalized understanding, returned demonstration, verbal cues required, tactile cues required, and needs further education ?  ?  ?HOME EXERCISE PROGRAM: ?Access Code: [email or web address] ?URL: [email or web address] ? ?  ?ASSESSMENT: ?  ?CLINICAL IMPRESSION: ?Continues to present with improved ROM. Pinching on post capsule decreased with manual and increased ROM.  ?  ?Objective impairments include decreased activity tolerance, decreased knowledge of condition, decreased ROM, decreased strength, increased muscle spasms, impaired flexibility, impaired UE functional use, and pain. These impairments are limiting patient from cleaning, community activity, driving, meal prep, occupation, laundry, and shopping. Personal factors including Profession and Time since onset of injury/illness/exacerbation are also affecting patient's functional outcome. Patient will benefit from skilled PT to address above impairments and improve overall function. ?  ?REHAB POTENTIAL: Good ?  ?CLINICAL DECISION MAKING: Stable/uncomplicated ?  ?EVALUATION COMPLEXITY: Low ?  ?  ?GOALS: ?Goals reviewed with patient? Yes ?  ?SHORT TERM GOALS: ?  ?STG Name Target Date Goal status  ?1 Pt will demonstrate progression with post op protocol ?Baseline: will progress as appropriate 12/14/2021 achieved  ?  2 Independent in donning/doffing sling ?Baseline: educated at eval 11/30/2021 achieved  ?         ?         ?         ?         ?         ?  ?LONG TERM GOALS:  ?  ?LTG Name Target Date Goal status  ?1 Pt will demo proper form with lifting 10lb to overhead shelf ?Baseline: 01/25/2022 INITIAL   ?2 Pt will demo ROM within 10 deg of Rt UE without compensation ?Baseline:unable at eval 01/25/2022 INITIAL  ?3 Pt will demo at least 80% strength in Left arm as Rt in dynamometry testing ?Baseline: unable to test at eval 02/08/2022 INITIAL  ?4 Pt will begin increased strength training for return work regarding lifting requirements ?Baseline: will progress as appropriate 02/08/2022 INITIAL  ?5 Pt will verbalize pain <=2/10 with ADLs ?Baseline: unable to use Lt UE for ADLs at eval 02/08/2022 INITIAL  ?         ?         ?  ?PLAN: ?PT FREQUENCY: 1-2x/week ?  ?PT DURATION: 12 weeks ?  ?PLANNED INTERVENTIONS: Therapeutic exercises, Therapeutic activity, Neuro Muscular re-education, Patient/Family education, Joint mobilization, Aquatic Therapy, Dry Needling, Electrical stimulation, Cryotherapy, Moist heat, scar mobilization, Taping, and Manual therapy ?  ?PLAN FOR NEXT SESSION: continue per protocol ?  ? ?Kaleo Condrey C. Krayton Wortley PT, DPT ?12/23/21 10:58 AM ? ? ?  ?

## 2021-12-24 ENCOUNTER — Ambulatory Visit (INDEPENDENT_AMBULATORY_CARE_PROVIDER_SITE_OTHER): Payer: Self-pay | Admitting: Orthopaedic Surgery

## 2021-12-24 DIAGNOSIS — S43432A Superior glenoid labrum lesion of left shoulder, initial encounter: Secondary | ICD-10-CM

## 2021-12-24 NOTE — Progress Notes (Signed)
? ?                            ? ? ?Post Operative Evaluation ?  ? ?Procedure/Date of Surgery: 11/11/21 -left shoulder labral repair ? ?Interval History:  ? ?Patient presents today for follow-up of her left shoulder labral repair.  She is doing physical therapy twice a week.  She is working with Shanda Bumps.  Overall she has been working on scapular mobilization and has now progressed to active range of motion.  She is overall doing very well.  Denies any pain or recurrent instability. ? ? ?PMH/PSH/Family History/Social History/Meds/Allergies:   ? ?Past Medical History:  ?Diagnosis Date  ? Medical history non-contributory   ? Shoulder injury   ? ?Past Surgical History:  ?Procedure Laterality Date  ? SHOULDER ARTHROSCOPY WITH LABRAL REPAIR Left 11/11/2021  ? Procedure: Left SHOULDER ARTHROSCOPY WITH LABRAL REPAIR;  Surgeon: Huel Cote, MD;  Location: [facility];  Service: Orthopedics;  Laterality: Left;  ? TONSILLECTOMY    ? wisdom teeth    ? ?Social History  ? ?Socioeconomic History  ? Marital status: Single  ?  Spouse name: Not on file  ? Number of children: Not on file  ? Years of education: Not on file  ? Highest education level: Not on file  ?Occupational History  ? Not on file  ?Tobacco Use  ? Smoking status: Former  ?  Types: Cigarettes  ?  Quit date: 09/30/2012  ?  Years since quitting: 9.2  ? Smokeless tobacco: Never  ?Vaping Use  ? Vaping Use: Never used  ?Substance and Sexual Activity  ? Alcohol use: Not Currently  ? Drug use: Not Currently  ? Sexual activity: Yes  ?  Birth control/protection: None  ?Other Topics Concern  ? Not on file  ?Social History Narrative  ? Not on file  ? ?Social Determinants of Health  ? ?Financial Resource Strain: Not on file  ?Food Insecurity: Not on file  ?Transportation Needs: Not on file  ?Physical Activity: Not on file  ?Stress: Not on file  ?Social Connections: Not on file  ? ?Family History  ?Problem Relation Age of Onset  ? Hypertension Other   ? ?No Known  Allergies ?Current Outpatient Medications  ?Medication Sig Dispense Refill  ? aspirin EC 325 MG tablet Take 1 tablet (325 mg total) by mouth daily. 30 tablet 0  ? cyclobenzaprine (FLEXERIL) 10 MG tablet Take 1 tablet (10 mg total) by mouth 2 (two) times daily as needed for muscle spasms. 10 tablet 0  ? ibuprofen (ADVIL) 600 MG tablet Take 1 tablet (600 mg total) by mouth every 6 (six) hours as needed. 15 tablet 0  ? meloxicam (MOBIC) 15 MG tablet Take 1 tablet (15 mg total) by mouth daily. 30 tablet 2  ? methylPREDNISolone (MEDROL DOSEPAK) 4 MG TBPK tablet Take per packet instructions 21 each 0  ? oxyCODONE (OXY IR/ROXICODONE) 5 MG immediate release tablet Take 1 tablet (5 mg total) by mouth every 4 (four) hours as needed (severe pain). 20 tablet 0  ? ?No current facility-administered medications for this visit.  ? ?No results found. ? ?Review of Systems:   ?A ROS was performed including pertinent positives and negatives as documented in the HPI. ? ? ?Musculoskeletal Exam:   ? ? ?Incisions are well-healed.  Left forward active elevation is to 170 degrees equal to contralateral side.  External rotation at the side is to 60  degrees equal to contralateral side.  Internal rotation is to back pocket.  Sensation is intact left upper extremity ? ?Imaging:   ? ?None ? ?I personally reviewed and interpreted the radiographs. ? ? ?Assessment:   ?31 year old female who is 6 weeks status post left shoulder labral repair overall she is doing extremely well.  She will continue to advance per protocol.  At this time they will suddenly introduce strengthening as well. ?Plan :   ? ?-Return to clinic in 6 weeks ? ? ? ? ? ?I personally saw and evaluated the patient, and participated in the management and treatment plan. ? ?Huel Cote, MD ?Attending Physician, Orthopedic Surgery ? ?This document was dictated using Conservation officer, historic buildings. A reasonable attempt at proof reading has been made to minimize errors. ?

## 2021-12-28 ENCOUNTER — Ambulatory Visit (HOSPITAL_BASED_OUTPATIENT_CLINIC_OR_DEPARTMENT_OTHER): Payer: BC Managed Care – PPO | Admitting: Physical Therapy

## 2021-12-28 ENCOUNTER — Encounter (HOSPITAL_BASED_OUTPATIENT_CLINIC_OR_DEPARTMENT_OTHER): Payer: Self-pay | Admitting: Physical Therapy

## 2021-12-28 ENCOUNTER — Other Ambulatory Visit: Payer: Self-pay

## 2021-12-28 DIAGNOSIS — M25512 Pain in left shoulder: Secondary | ICD-10-CM | POA: Diagnosis not present

## 2021-12-28 DIAGNOSIS — M6281 Muscle weakness (generalized): Secondary | ICD-10-CM

## 2021-12-28 NOTE — Therapy (Signed)
?OUTPATIENT PHYSICAL THERAPY TREATMENT NOTE ? ? ?Patient Name: Rhonda [name] ?MRN: [id number] ?DOB:[birth year], 31 y.o., female ?Today's Date: 12/28/2021 ? ?PCP: Patient, No Pcp Per (Inactive) ?REFERRING PROVIDER: [name], MD ? ? PT End of Session - 12/28/21 1055   ? ? Visit Number 9   ? Number of Visits 25   ? Date for PT Re-Evaluation 02/11/22   ? Authorization Type BCBS   ? PT Start Time 1100   ? PT Stop Time 1140   ? PT Time Calculation (min) 40 min   ? Activity Tolerance Patient tolerated treatment well   ? Behavior During Therapy [facility] for tasks assessed/performed   ? ?  ?  ? ?  ? ? ? ? ? ?Past Medical History:  ?Diagnosis Date  ? Medical history non-contributory   ? Shoulder injury   ? ?Past Surgical History:  ?Procedure Laterality Date  ? SHOULDER ARTHROSCOPY WITH LABRAL REPAIR Left 11/11/2021  ? Procedure: Left SHOULDER ARTHROSCOPY WITH LABRAL REPAIR;  Surgeon: [name], MD;  Location: [place];  Service: Orthopedics;  Laterality: Left;  ? TONSILLECTOMY    ? wisdom teeth    ? ?Patient Active Problem List  ? Diagnosis Date Noted  ? Tear of left glenoid labrum   ? Hyperemesis gravidarum 01/08/2020  ? Leukocytosis 01/08/2020  ? Abnormal uterine bleeding 10/11/2018  ? ? ?REFERRING DIAG: S/p Lt labral repair ? ?THERAPY DIAG:  ?Acute pain of left shoulder ? ?Muscle weakness (generalized) ? ?PERTINENT HISTORY: none ? ?PRECAUTIONS: shoulder, DOS 11/11/21 ? ?SUBJECTIVE:  Pt states she has not had issues since last session. "It has gotten better every week." She states she has some minor neck soreness that is better than last time.  ? ? ?PAIN:  ?Are you having pain? No ?NPRS scale:0/10 ?Pain location: Lt shoulder ? ?Aggravating factors: laying on it ?Relieving factors: meds, ice ? ?OBJECTIVE:  ?  ?PATIENT SURVEYS:  ?FOTO 25 ? ?FOTO: 50.2 at 7th VISIT ? ?3/28: AROM flexion 155 ? ?         ?TODAY'S TREATMENT:  ? ?3/28: ? ?Joint mobilization with ABD and inf; grade III inf and post mob ? ?PROM to  protocol limits ? ?Supine flexion/ext 90-120 1lb weight, both arms moving 2x10 ?Supine ABCs at 90 with 1lb, larger range, 1x ?Prone Y and T 2x10 ?Supine protraction at 90 2x10 1lb ?Standing YTB row and shoulder ext 3x10 each ?Shoulder ABD AROM to 120 2x10 ?DB tricep kickback 1lb 2x10 ? ?3/23: ?MANUAL: passive stretching; STM Lt upper traps and cervical paraspinals; A/P and inf mobs ?Supine flexion/ext 90-120 1lb weight, both arms moving ?Supine ABCs at 90, larger range without pain ?Bent over row & triceps kick 1lb weights ?Wall walk with liftoff ? ?3/21: ?Joint mobilization with ABD and inf; grade III inf and post mob ? ?PROM to protocol limits ? ?Self AAROM supine flexion 3s 10x ?AROM flexion and ABD 2x10 each ?Table ABD 3s 10x ?Prone scap retraction/row 3x10 ?Prone T 2x10 ? ? ?  ?  ?PATIENT EDUCATION: ?Education details: relevant anatomy, exercise progression, DOMS expectations, muscle firing,  envelope of function, HEP ?  ?Person educated: Patient ?Education method: Explanation, Demonstration, Tactile cues, Verbal cues, and Handouts- text HEP ?Education comprehension: verbalized understanding, returned demonstration, verbal cues required, tactile cues required, and needs further education ?  ?  ?HOME EXERCISE PROGRAM: ?Access Code: [email or web address] ?URL: [email or web address] ? ?  ?ASSESSMENT: ?  ?CLINICAL IMPRESSION: ?Pt with improved ROM at today's session with AROM and PROM.  Pt is able protocol limits at today's session. Pt had less neck irritation and pinching with ABD following manual and exercise. Pt able to progress scapular strengthening against gravity today without issue. Pt did require VC and TC for more scapular based movement vs UT compensation. Plan to continue per protocol for 4-8wks. Pt will be 8 weeks on 4/6.  ?  ?Objective impairments include decreased activity tolerance, decreased knowledge of condition, decreased ROM, decreased strength, increased muscle spasms, impaired flexibility,  impaired UE functional use, and pain. These impairments are limiting patient from cleaning, community activity, driving, meal prep, occupation, laundry, and shopping. Personal factors including Profession and Time since onset of injury/illness/exacerbation are also affecting patient's functional outcome. Patient will benefit from skilled PT to address above impairments and improve overall function. ?  ?REHAB POTENTIAL: Good ?  ?CLINICAL DECISION MAKING: Stable/uncomplicated ?  ?EVALUATION COMPLEXITY: Low ?  ?  ?GOALS: ?Goals reviewed with patient? Yes ?  ?SHORT TERM GOALS: ?  ?STG Name Target Date Goal status  ?1 Pt will demonstrate progression with post op protocol ?Baseline: will progress as appropriate 12/14/2021 achieved  ?2 Independent in donning/doffing sling ?Baseline: educated at eval 11/30/2021 achieved  ?         ?         ?         ?         ?         ?  ?LONG TERM GOALS:  ?  ?LTG Name Target Date Goal status  ?1 Pt will demo proper form with lifting 10lb to overhead shelf ?Baseline: 01/25/2022 INITIAL  ?2 Pt will demo ROM within 10 deg of Rt UE without compensation ?Baseline:unable at eval 01/25/2022 INITIAL  ?3 Pt will demo at least 80% strength in Left arm as Rt in dynamometry testing ?Baseline: unable to test at eval 02/08/2022 INITIAL  ?4 Pt will begin increased strength training for return work regarding lifting requirements ?Baseline: will progress as appropriate 02/08/2022 INITIAL  ?5 Pt will verbalize pain <=2/10 with ADLs ?Baseline: unable to use Lt UE for ADLs at eval 02/08/2022 INITIAL  ?         ?         ?  ?PLAN: ?PT FREQUENCY: 1-2x/week ?  ?PT DURATION: 12 weeks ?  ?PLANNED INTERVENTIONS: Therapeutic exercises, Therapeutic activity, Neuro Muscular re-education, Patient/Family education, Joint mobilization, Aquatic Therapy, Dry Needling, Electrical stimulation, Cryotherapy, Moist heat, scar mobilization, Taping, and Manual therapy ?  ?PLAN FOR NEXT SESSION: continue per protocol ?  ?Daleen Bo PT,  DPT ?12/28/21 11:40 AM ? ? ? ?  ?

## 2021-12-29 ENCOUNTER — Ambulatory Visit (INDEPENDENT_AMBULATORY_CARE_PROVIDER_SITE_OTHER): Payer: BC Managed Care – PPO | Admitting: Psychologist

## 2021-12-29 DIAGNOSIS — F331 Major depressive disorder, recurrent, moderate: Secondary | ICD-10-CM | POA: Diagnosis not present

## 2021-12-29 DIAGNOSIS — F411 Generalized anxiety disorder: Secondary | ICD-10-CM

## 2021-12-29 DIAGNOSIS — Z634 Disappearance and death of family member: Secondary | ICD-10-CM

## 2021-12-29 NOTE — Plan of Care (Signed)
Goals °Alleviate depressive symptoms °Recognize, accept, and cope with depressive feelings °Develop healthy thinking patterns °Develop healthy interpersonal relationships °Reduce overall frequency, intensity, and duration of anxiety °Stabilize anxiety level wile increasing ability to function °Enhance ability to effectively cope with full variety of stressors °Learn and implement coping skills that result in a reduction of anxiety  ° °Objectives °Verbalize an understanding of the cognitive, physiological, and behavioral components of anxiety °Learning and implement calming skills to reduce overall anxiety °Verbalize an understanding of the role that cognitive biases play in excessive irrational worry and persistent anxiety symptoms °Identify, challenge, and replace based fearful talk °Learn and implement problem solving strategies °Identify and engage in pleasant activities °Learning and implement personal and interpersonal skills to reduce anxiety and improve interpersonal relationships °Learn to accept limitations in life and commit to tolerating, rather than avoiding, unpleasant emotions while accomplishing meaningful goals °Identify major life conflicts from the past and present that form the basis for present anxiety °Maintain involvement in work, family, and social activities °Reestablish a consistent sleep-wake cycle °Cooperate with a medical evaluation  °Cooperate with a medication evaluation by a physician °Verbalize an accurate understanding of depression °Verbalize an understanding of the treatment °Identify and replace thoughts that support depression °Learn and implement behavioral strategies °Verbalize an understanding and resolution of current interpersonal problems °Learn and implement problem solving and decision making skills °Learn and implement conflict resolution skills to resolve interpersonal problems °Verbalize an understanding of healthy and unhealthy emotions verbalize insight into how past  relationships may be influence current experiences with depression °Use mindfulness and acceptance strategies and increase value based behavior  °Increase hopeful statements about the future.  °Interventions °Engage the patient in behavioral activation °Use instruction, modeling, and role-playing to build the client's general social, communication, and/or conflict resolution skills °Use Acceptance and Commitment Therapy to help client accept uncomfortable realities in order to accomplish value-consistent goals °Reinforce the client's insight into the role of his/her past emotional pain and present anxiety  °Support the client in following through with work, family, and social activities °Teach and implement sleep hygiene practices  °Refer the patient to a physician for a psychotropic medication consultation °Monito the clint's psychotropic medication compliance °Discuss how anxiety typically involves excessive worry, various bodily expressions of tension, and avoidance of what is threatening that interact to maintain the problem  °Teach the patient relaxation skills °Assign the patient homework °Discuss examples demonstrating that unrealistic worry overestimates the probability of threats and underestimates patient's ability  °Assist the patient in analyzing his or her worries °Help patient understand that avoidance is reinforcing  °Consistent with treatment model, discuss how change in cognitive, behavioral, and interpersonal can help client alleviate depression °CBT °Behavioral activation help the client explore the relationship, nature of the dispute,  °Help the client develop new interpersonal skills and relationships °Conduct Problem solving therapy °Teach conflict resolution skills °Use a process-experiential approach °Conduct TLDP °Conduct ACT °Evaluate need for psychotropic medication °Monitor adherence to medication  °Goals °Begin a healthy grieving process °Objectives °Tell in detail the story of the  current loss that is triggering symptoms °Read books on the topic of grief °Watch videos on the theme of grief °Begin verbalizing feelings associated with the loss °Attend a grief support group °express thoughts and feelings about the deceased °Identify and voice positives about the deceased °implement acts of spiritual faith  °Interventions °create a safe environment and actively build trust °use empathy, compassion, and support °ask the patient to write a letter to   the lost person °conduct empty chair °ask the patient to discuss and list the positives and negative aspects of the person °encourage patient to rely upon his/her spiritual faith  °ask client to read books on grief °ask patient to watch videos about grief °assist patient in identifying emotions  °ask patient to attend support group  ° ° °

## 2021-12-29 NOTE — Progress Notes (Signed)
                Cristoval Teall, PsyD 
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
## 2021-12-30 ENCOUNTER — Encounter (HOSPITAL_BASED_OUTPATIENT_CLINIC_OR_DEPARTMENT_OTHER): Payer: Self-pay | Admitting: Physical Therapy

## 2021-12-30 ENCOUNTER — Ambulatory Visit (HOSPITAL_BASED_OUTPATIENT_CLINIC_OR_DEPARTMENT_OTHER): Payer: BC Managed Care – PPO | Admitting: Physical Therapy

## 2021-12-30 DIAGNOSIS — M25512 Pain in left shoulder: Secondary | ICD-10-CM

## 2021-12-30 DIAGNOSIS — M6281 Muscle weakness (generalized): Secondary | ICD-10-CM

## 2021-12-30 NOTE — Therapy (Signed)
?OUTPATIENT PHYSICAL THERAPY TREATMENT NOTE ? ? ?Patient Name: Rhonda [name] ?MRN: [id number] ?DOB:Nov 24, 1990, 31 y.o., female ?Today's Date: 12/30/2021 ? ?PCP: Patient, No Pcp Per (Inactive) ?REFERRING PROVIDER: Huel Cote, MD ? ? PT End of Session - 12/30/21 1417   ? ? Visit Number 10   ? Number of Visits 25   ? Date for PT Re-Evaluation 02/11/22   ? Authorization Type BCBS   ? PT Start Time 1355   ? PT Stop Time 1425   ? PT Time Calculation (min) 30 min   ? Activity Tolerance Patient tolerated treatment well   ? Behavior During Therapy [facility] for tasks assessed/performed   ? ?  ?  ? ?  ? ? ? ? ? ? ?Past Medical History:  ?Diagnosis Date  ? Medical history non-contributory   ? Shoulder injury   ? ?Past Surgical History:  ?Procedure Laterality Date  ? SHOULDER ARTHROSCOPY WITH LABRAL REPAIR Left 11/11/2021  ? Procedure: Left SHOULDER ARTHROSCOPY WITH LABRAL REPAIR;  Surgeon: Huel Cote, MD;  Location: [facility];  Service: Orthopedics;  Laterality: Left;  ? TONSILLECTOMY    ? wisdom teeth    ? ?Patient Active Problem List  ? Diagnosis Date Noted  ? Tear of left glenoid labrum   ? Hyperemesis gravidarum 01/08/2020  ? Leukocytosis 01/08/2020  ? Abnormal uterine bleeding 10/11/2018  ? ? ?REFERRING DIAG: S/p Lt labral repair ? ?THERAPY DIAG:  ?Acute pain of left shoulder ? ?Muscle weakness (generalized) ? ?PERTINENT HISTORY: none ? ?PRECAUTIONS: shoulder, DOS 11/11/21 ? ?SUBJECTIVE:  Pt states that she twisted her arm getting out of the car and it is sore right now. She had minor soreness after exercise last time but no pain.  ? ? ?PAIN:  ?Are you having pain? Yes ?NPRS scale: 5/10 ?Pain location: Lt shoulder ? ?Aggravating factors: laying on it ?Relieving factors: meds, ice ? ?OBJECTIVE:  ?  ?PATIENT SURVEYS:  ?FOTO 25 ? ?FOTO: 50.2 at 7th VISIT ? ?FOTO: 10th visit 57 pts ? ?3/30: AROM flexion 150 ? ?         ?TODAY'S TREATMENT:  ?3/30: ? ?Joint mobilization with ABD and inf; grade III inf and post  mob ? ?STM: L deltoid, biceps ? ?PROM to protocol limits ? ?Supine flexion/ext 90-120 2 lb weight, both arms moving 2x10 ?Prone Y and T 2x10 ?Supine protraction at 90 2x10 2lb ? ? ?3/28: ? ?Joint mobilization with ABD and inf; grade III inf and post mob ? ?PROM to protocol limits ? ?Supine flexion/ext 90-120 1lb weight, both arms moving 2x10 ?Supine ABCs at 90 with 1lb, larger range, 1x ?Prone Y and T 2x10 ?Supine protraction at 90 2x10 1lb ?Standing YTB row and shoulder ext 3x10 each ?Shoulder ABD AROM to 120 2x10 ?DB tricep kickback 1lb 2x10 ? ?3/23: ?MANUAL: passive stretching; STM Lt upper traps and cervical paraspinals; A/P and inf mobs ?Supine flexion/ext 90-120 1lb weight, both arms moving ?Supine ABCs at 90, larger range without pain ?Bent over row & triceps kick 1lb weights ?Wall walk with liftoff ? ?3/21: ?Joint mobilization with ABD and inf; grade III inf and post mob ? ?PROM to protocol limits ? ?Self AAROM supine flexion 3s 10x ?AROM flexion and ABD 2x10 each ?Table ABD 3s 10x ?Prone scap retraction/row 3x10 ?Prone T 2x10 ? ? ?  ?  ?PATIENT EDUCATION: ?Education details: relevant anatomy, exercise progression, DOMS expectations, muscle firing,  envelope of function, HEP ?  ?Person educated: Patient ?Education method: Explanation, Demonstration, Tactile  cues, Verbal cues, and Handouts- text HEP ?Education comprehension: verbalized understanding, returned demonstration, verbal cues required, tactile cues required, and needs further education ?  ?  ?HOME EXERCISE PROGRAM: ?Access Code: [email or web address] ?URL: [email or web address] ? ?  ?ASSESSMENT: ?  ?CLINICAL IMPRESSION: ?Session limited today by time. Plan to do PN at next session.  ? ?Pt presented with increased pain a beginning of today's session from twisting motion just prior to entering clinic. Pt responded well to [facility] and joint mobilizations with ability to continue with previous exercise as well as increase intensity of loading below 90 deg  without pain. Scapular retraction exercised added in for home at this time. Plan to continue per protocol for 4-8wks. Pt will be 8 weeks on 4/6.  ?  ?Objective impairments include decreased activity tolerance, decreased knowledge of condition, decreased ROM, decreased strength, increased muscle spasms, impaired flexibility, impaired UE functional use, and pain. These impairments are limiting patient from cleaning, community activity, driving, meal prep, occupation, laundry, and shopping. Personal factors including Profession and Time since onset of injury/illness/exacerbation are also affecting patient's functional outcome. Patient will benefit from skilled PT to address above impairments and improve overall function. ?  ?REHAB POTENTIAL: Good ?  ?CLINICAL DECISION MAKING: Stable/uncomplicated ?  ?EVALUATION COMPLEXITY: Low ?  ?  ?GOALS: ?Goals reviewed with patient? Yes ?  ?SHORT TERM GOALS: ?  ?STG Name Target Date Goal status  ?1 Pt will demonstrate progression with post op protocol ?Baseline: will progress as appropriate 12/14/2021 achieved  ?2 Independent in donning/doffing sling ?Baseline: educated at eval 11/30/2021 achieved  ?         ?         ?         ?         ?         ?  ?LONG TERM GOALS:  ?  ?LTG Name Target Date Goal status  ?1 Pt will demo proper form with lifting 10lb to overhead shelf ?Baseline: 01/25/2022 INITIAL  ?2 Pt will demo ROM within 10 deg of Rt UE without compensation ?Baseline:unable at eval 01/25/2022 INITIAL  ?3 Pt will demo at least 80% strength in Left arm as Rt in dynamometry testing ?Baseline: unable to test at eval 02/08/2022 INITIAL  ?4 Pt will begin increased strength training for return work regarding lifting requirements ?Baseline: will progress as appropriate 02/08/2022 INITIAL  ?5 Pt will verbalize pain <=2/10 with ADLs ?Baseline: unable to use Lt UE for ADLs at eval 02/08/2022 INITIAL  ?         ?         ?  ?PLAN: ?PT FREQUENCY: 1-2x/week ?  ?PT DURATION: 12 weeks ?  ?PLANNED  INTERVENTIONS: Therapeutic exercises, Therapeutic activity, Neuro Muscular re-education, Patient/Family education, Joint mobilization, Aquatic Therapy, Dry Needling, Electrical stimulation, Cryotherapy, Moist heat, scar mobilization, Taping, and Manual therapy ?  ?PLAN FOR NEXT SESSION: continue per protocol, PN ?  ?Zebedee Iba PT, DPT ?12/30/21 2:28 PM ? ? ? ?  ?

## 2022-01-04 ENCOUNTER — Ambulatory Visit (HOSPITAL_BASED_OUTPATIENT_CLINIC_OR_DEPARTMENT_OTHER): Payer: BC Managed Care – PPO | Attending: Orthopaedic Surgery | Admitting: Physical Therapy

## 2022-01-04 ENCOUNTER — Encounter (HOSPITAL_BASED_OUTPATIENT_CLINIC_OR_DEPARTMENT_OTHER): Payer: Self-pay | Admitting: Physical Therapy

## 2022-01-04 DIAGNOSIS — M25512 Pain in left shoulder: Secondary | ICD-10-CM | POA: Insufficient documentation

## 2022-01-04 DIAGNOSIS — M6281 Muscle weakness (generalized): Secondary | ICD-10-CM | POA: Diagnosis present

## 2022-01-04 NOTE — Therapy (Signed)
?OUTPATIENT PHYSICAL THERAPY PROGRESS NOTE ? ? ?Patient Name: Rhonda [name] ?MRN: [id number] ?DOB:[birth year], 31 y.o., female ?Today's Date: 01/04/2022 ? ?PCP: Patient, No Pcp Per (Inactive) ?REFERRING PROVIDER: Huel Cote, MD ? ? PT End of Session - 01/04/22 1145   ? ? Visit Number 11   ? Number of Visits 25   ? Date for PT Re-Evaluation 02/11/22   ? Authorization Type BCBS   ? PT Start Time 1145   ? PT Stop Time 1223   ? PT Time Calculation (min) 38 min   ? Activity Tolerance Patient tolerated treatment well   ? Behavior During Therapy [facility] for tasks assessed/performed   ? ?  ?  ? ?  ? ? ? ? ? ? ? ?Past Medical History:  ?Diagnosis Date  ? Medical history non-contributory   ? Shoulder injury   ? ?Past Surgical History:  ?Procedure Laterality Date  ? SHOULDER ARTHROSCOPY WITH LABRAL REPAIR Left 11/11/2021  ? Procedure: Left SHOULDER ARTHROSCOPY WITH LABRAL REPAIR;  Surgeon: Huel Cote, MD;  Location: [facility];  Service: Orthopedics;  Laterality: Left;  ? TONSILLECTOMY    ? wisdom teeth    ? ?Patient Active Problem List  ? Diagnosis Date Noted  ? Tear of left glenoid labrum   ? Hyperemesis gravidarum 01/08/2020  ? Leukocytosis 01/08/2020  ? Abnormal uterine bleeding 10/11/2018  ? ? ?REFERRING DIAG: S/p Lt labral repair ? ?THERAPY DIAG:  ?Acute pain of left shoulder ? ?Muscle weakness (generalized) ? ?PERTINENT HISTORY: none ? ?PRECAUTIONS: shoulder, DOS 11/11/21 ? ?SUBJECTIVE:  Pt states the shoulder is sore today from doing more but does not have extra pain. She is  ? ?PAIN:  ?Are you having pain? Yes ?NPRS scale: 3/10 ?Pain location: Lt shoulder ? ?Aggravating factors: laying on it ?Relieving factors: meds, ice ? ?OBJECTIVE:  ?  ?PATIENT SURVEYS:  ?FOTO 25 ?FOTO: 50.2 at 7th VISIT ?FOTO: 10th visit 57 pts ?FOTO: 11th visit  ? ?4/4: AROM flexion 160 ?ABD 148 ?IR at 0 to belly, BHB reaching to waistline  ?ER at 0 55 ? ?MMT: formal MMT/HHD testing not indicated at this time given surgical healing  timeline ?         ?TODAY'S TREATMENT:  ? ?4/4 ? ?Joint mobilization with ABD and inf; grade III inf and post mob ? ? ?PROM to protocol limits ? ?RTB rows 3x10 ?Standing scaption 1lb and ABD 2x10 ?Wall ball circles 20x CW and CCW ?Wall push up 2x10 ?Standing wall protraction 2x10 ?ER YTB 2x10  ? ?3/30: ? ?Joint mobilization with ABD and inf; grade III inf and post mob ? ?STM: L deltoid, biceps ? ?PROM to protocol limits ? ?Supine flexion/ext 90-120 2 lb weight, both arms moving 2x10 ?Prone Y and T 2x10 ?Supine protraction at 90 2x10 2lb ? ? ?3/28: ? ?Joint mobilization with ABD and inf; grade III inf and post mob ? ?PROM to protocol limits ? ?Supine flexion/ext 90-120 1lb weight, both arms moving 2x10 ?Supine ABCs at 90 with 1lb, larger range, 1x ?Prone Y and T 2x10 ?Supine protraction at 90 2x10 1lb ?Standing YTB row and shoulder ext 3x10 each ?Shoulder ABD AROM to 120 2x10 ?DB tricep kickback 1lb 2x10 ? ?3/23: ?MANUAL: passive stretching; STM Lt upper traps and cervical paraspinals; A/P and inf mobs ?Supine flexion/ext 90-120 1lb weight, both arms moving ?Supine ABCs at 90, larger range without pain ?Bent over row & triceps kick 1lb weights ?Wall walk with liftoff ? ?3/21: ?Joint mobilization with  ABD and inf; grade III inf and post mob ? ?PROM to protocol limits ? ?Self AAROM supine flexion 3s 10x ?AROM flexion and ABD 2x10 each ?Table ABD 3s 10x ?Prone scap retraction/row 3x10 ?Prone T 2x10 ? ? ?  ?  ?PATIENT EDUCATION: ?Education details: relevant anatomy, exercise progression, DOMS expectations, muscle firing,  envelope of function, HEP ?  ?Person educated: Patient ?Education method: Explanation, Demonstration, Tactile cues, Verbal cues, and Handouts- text HEP ?Education comprehension: verbalized understanding, returned demonstration, verbal cues required, tactile cues required, and needs further education ?  ?  ?HOME EXERCISE PROGRAM: ?Access Code: [email or web address] ?URL: [email or web address] ? ?   ?ASSESSMENT: ?  ?CLINICAL IMPRESSION: ? Plan to continue per protocol for 4-8wks. Pt is nearly at 8 wks. Pt demonstrates AROM  to protocol limits at this time. Pt has well managed pain with expected DOMS with new resistance exercise. Pt able to progress as expected. Pt is doing well and will be able to progress to next phase of protocol as anticipated. Pt able to progress with light CKC without issues.  ?  ?Objective impairments include decreased activity tolerance, decreased knowledge of condition, decreased ROM, decreased strength, increased muscle spasms, impaired flexibility, impaired UE functional use, and pain. These impairments are limiting patient from cleaning, community activity, driving, meal prep, occupation, laundry, and shopping. Personal factors including Profession and Time since onset of injury/illness/exacerbation are also affecting patient's functional outcome. Patient will benefit from skilled PT to address above impairments and improve overall function. ?  ?REHAB POTENTIAL: Good ?  ?CLINICAL DECISION MAKING: Stable/uncomplicated ?  ?EVALUATION COMPLEXITY: Low ?  ?  ?GOALS: ?Goals reviewed with patient? Yes ?  ?SHORT TERM GOALS: ?  ?STG Name Target Date Goal status  ?1 Pt will demonstrate progression with post op protocol ?Baseline: will progress as appropriate 12/14/2021 achieved  ?2 Independent in donning/doffing sling ?Baseline: educated at eval 11/30/2021 achieved  ?         ?         ?         ?         ?         ?  ?LONG TERM GOALS:  ?  ?LTG Name Target Date Goal status  ?1 Pt will demo proper form with lifting 10lb to overhead shelf ?Baseline: 01/25/2022 ongoing  ?2 Pt will demo ROM within 10 deg of Rt UE without compensation ?Baseline:unable at eval 01/25/2022 ongoing  ?3 Pt will demo at least 80% strength in Left arm as Rt in dynamometry testing ?Baseline: unable to test at eval 02/08/2022 ongoing  ?4 Pt will begin increased strength training for return work regarding lifting  requirements ?Baseline: will progress as appropriate 02/08/2022 ongoing  ?5 Pt will verbalize pain <=2/10 with ADLs ?Baseline: unable to use Lt UE for ADLs at eval 02/08/2022 ongoing  ?         ?         ?  ?PLAN: ?PT FREQUENCY: 1-2x/week ?  ?PT DURATION: 12 weeks ?  ?PLANNED INTERVENTIONS: Therapeutic exercises, Therapeutic activity, Neuro Muscular re-education, Patient/Family education, Joint mobilization, Aquatic Therapy, Dry Needling, Electrical stimulation, Cryotherapy, Moist heat, scar mobilization, Taping, and Manual therapy ?  ?PLAN FOR NEXT SESSION: continue per protocol, DB rowing, light OH movement ? ? ?Zebedee Iba PT, DPT ?01/04/22 12:30 PM ? ? ? ?  ?

## 2022-01-05 ENCOUNTER — Telehealth: Payer: Self-pay | Admitting: Psychologist

## 2022-01-05 ENCOUNTER — Ambulatory Visit (HOSPITAL_COMMUNITY)
Admission: EM | Admit: 2022-01-05 | Discharge: 2022-01-05 | Disposition: A | Discharge status: Home / Self Care | Payer: BC Managed Care – PPO

## 2022-01-05 DIAGNOSIS — F32A Depression, unspecified: Secondary | ICD-10-CM | POA: Diagnosis not present

## 2022-01-05 DIAGNOSIS — F411 Generalized anxiety disorder: Secondary | ICD-10-CM

## 2022-01-05 DIAGNOSIS — F1010 Alcohol abuse, uncomplicated: Secondary | ICD-10-CM

## 2022-01-05 NOTE — Discharge Instructions (Addendum)
Follow up with her psychology ?Pt is aware she can return to [facility] if she chooses to for help ?
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
## 2022-01-05 NOTE — Telephone Encounter (Signed)
Spoke with the patient on the phone from 1:50 pm to 2:05 pm related to an event that occurred the night before in which patient called the crisis line. Patient said that she spoke to someone on the phone as she was in a really bad place. Patient stated that she did not attempt suicide last night when asked by this provider. She acknowledged that she is feeling overwhelmed currently and that she needs more intensive services. The patient stated that the police took her to the hospital and that she was given the option to be admitted or to come back for an urgent evaluation. Patient has decided to follow up with them related to the urgent evaluation. She stated that she recognizes that she needs more intensive services than what this clinician can provide at this time due to clinician's current availability. She denied current suicidal and homicidal ideation and denied having a plan. She stated again that she did not attempt suicide last evening and wondered if there was a recording of the conversation. Patient again agreed to follow up with the urgent evaluation recognizing that she needs more intensive services at this time. She again denied active suicidal and homicidal ideation.  ?
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
## 2022-01-05 NOTE — Progress Notes (Signed)
?   01/05/22 0208  ?Patient Reported Information  ?What Is the Reason for Your Visit/Call Today? Pt reports, dealing with a lot of loss/trauma with in the last year and half. Pt reports, she's been having reoccurring panic attacks, she called [facility] crisis line to talk to someone which resulted in a wellness check and coming to [facility]. Pt denies, SI, HI, AVH, self-injurious behaviors and access to weapons. Pt reports, she will not hurt herself her mother is recovering from a Mastectomy and has to go to [place] to be with her.  ?How Long Has This Been Causing You Problems? > than 6 months  ?What Do You Feel Would Help You the Most Today? Treatment for Depression or other mood problem;Medication(s);Social Support  ?Have You Recently Had Any Thoughts About Hurting Yourself? No  ?Are You Planning to Commit Suicide/Harm Yourself At This time? No  ?Have you Recently Had Thoughts About Hurting Someone [name]? No  ?Are You Planning To Harm Someone At This Time? No  ?Have You Used Any Alcohol or Drugs in the Past 24 Hours? Yes  ?What Did You Use and How Much? Pt was unable to give amount. Pt reports, smoking very little marijuana.  ?CCA Screening Triage Referral Assessment  ?Type of Contact Face-to-Face  ?Location of Assessment [facility] Assessment Services  ?Provider location [facility] Assessment Services  ?Collateral Involvement Pt presents tearful, smelled of marijuana with normal speech.  ?Does Patient Present under Involuntary Commitment? No  ?[place] of Residence [place]  ?Patient Currently Receiving the Following Services: Individual Therapy  ?Determination of Need Urgent (48 hours)  ?Options For Referral [facility] Urgent Care;Inpatient Hospitalization;Medication Management;Outpatient Therapy  ? ? ?Determination of need: Urgent.  ? ? ?Redmond Pulling, MS, [facility] ?Triage Specialist ?[phone] ? ?

## 2022-01-06 ENCOUNTER — Ambulatory Visit (HOSPITAL_BASED_OUTPATIENT_CLINIC_OR_DEPARTMENT_OTHER): Payer: BC Managed Care – PPO | Admitting: Physical Therapy

## 2022-01-06 ENCOUNTER — Encounter (HOSPITAL_BASED_OUTPATIENT_CLINIC_OR_DEPARTMENT_OTHER): Payer: Self-pay | Admitting: Physical Therapy

## 2022-01-06 DIAGNOSIS — M25512 Pain in left shoulder: Secondary | ICD-10-CM | POA: Diagnosis not present

## 2022-01-06 DIAGNOSIS — M6281 Muscle weakness (generalized): Secondary | ICD-10-CM

## 2022-01-06 NOTE — Therapy (Signed)
?OUTPATIENT PHYSICAL THERAPY TREATMENT NOTE ? ? ?Patient Name: Rhonda [name] ?MRN: [id number] ?DOB:1991-04-29, 31 y.o., female ?Today's Date: 01/06/2022 ? ?PCP: Patient, No Pcp Per (Inactive) ?REFERRING PROVIDER: Huel Cote, MD ? ? PT End of Session - 01/06/22 1421   ? ? Visit Number 12   ? Number of Visits 25   ? Date for PT Re-Evaluation 02/11/22   ? Authorization Type BCBS   ? PT Start Time 1347   ? PT Stop Time 1418   ? PT Time Calculation (min) 31 min   ? Activity Tolerance Patient tolerated treatment well   ? Behavior During Therapy [facility] for tasks assessed/performed   ? ?  ?  ? ?  ? ? ? ? ? ? ? ? ?Past Medical History:  ?Diagnosis Date  ? Medical history non-contributory   ? Shoulder injury   ? ?Past Surgical History:  ?Procedure Laterality Date  ? SHOULDER ARTHROSCOPY WITH LABRAL REPAIR Left 11/11/2021  ? Procedure: Left SHOULDER ARTHROSCOPY WITH LABRAL REPAIR;  Surgeon: Huel Cote, MD;  Location: [facility];  Service: Orthopedics;  Laterality: Left;  ? TONSILLECTOMY    ? wisdom teeth    ? ?Patient Active Problem List  ? Diagnosis Date Noted  ? Tear of left glenoid labrum   ? Hyperemesis gravidarum 01/08/2020  ? Leukocytosis 01/08/2020  ? Abnormal uterine bleeding 10/11/2018  ? ? ?REFERRING DIAG: S/p Lt labral repair ? ?THERAPY DIAG:  ?Acute pain of left shoulder ? ?Muscle weakness (generalized) ? ?PERTINENT HISTORY: none ? ?PRECAUTIONS: shoulder, DOS 11/11/21 ? ?SUBJECTIVE:  Pt states the shoulder is a little sore today but it is moving much better. ? ?PAIN:  ?Are you having pain? Yes ?NPRS scale: 3210 ?Pain location: Lt shoulder ? ?Aggravating factors: laying on it ?Relieving factors: meds, ice ? ?OBJECTIVE:  ?  ?PATIENT SURVEYS:  ?FOTO 25 ?FOTO: 50.2 at 7th VISIT ?FOTO: 10th visit 57 pts ?FOTO: 11th visit  ? ?4/4: AROM flexion 160 ?ABD 148 ?IR at 0 to belly, BHB reaching to waistline  ?ER at 0 55 ? ?MMT: formal MMT/HHD testing not indicated at this time given surgical healing timeline ?          ?TODAY'S TREATMENT:  ? ?4/6 ?Pulley's flexion and ABD 2 min each ? ?Bent over row 3x10 5lb ?Standing scaption 2lb and ABD 2x10 ?GTB and ext rows 3x10 ?Wall ball circles 20x CW and CCW ?Wall push up 2x10 ?5lb supine protraction 2x10 ? S/L 2lbs 2x10 ? Supine flexion 2lb 2x10 ? Standing IR strap stretch 3s 15x ? ?4/4 ? ?Joint mobilization with ABD and inf; grade III inf and post mob ? ? ?PROM to protocol limits ? ?RTB rows 3x10 ?Standing scaption 1lb and ABD 2x10 ?Wall ball circles 20x CW and CCW ?Wall push up 2x10 ?Standing wall protraction 2x10 ?ER YTB 2x10  ? ?3/30: ? ?Joint mobilization with ABD and inf; grade III inf and post mob ? ?STM: L deltoid, biceps ? ?PROM to protocol limits ? ?Supine flexion/ext 90-120 2 lb weight, both arms moving 2x10 ?Prone Y and T 2x10 ?Supine protraction at 90 2x10 2lb ? ? ?  ?PATIENT EDUCATION: ?Education details: relevant anatomy, exercise progression, DOMS expectations, muscle firing,  envelope of function, HEP ?  ?Person educated: Patient ?Education method: Explanation, Demonstration, Tactile cues, Verbal cues, and Handouts- text HEP ?Education comprehension: verbalized understanding, returned demonstration, verbal cues required, tactile cues required, and needs further education ?  ?  ?HOME EXERCISE PROGRAM: ?Access Code: [email or web address] ?URL:  [email or web address] ? ?  ?ASSESSMENT: ?  ?CLINICAL IMPRESSION: ? Pt able to start light strengthening based exercise today without pain or issues. Pt demos full ROM in all planes except for IR. Pt able to increased intensity and volume of loaading today without adverse effects. Pt does report fatigue with sustained repetitions as expected. Plan to review rows and IR stretching at next session for possible addition to HEP.  ?  ?Objective impairments include decreased activity tolerance, decreased knowledge of condition, decreased ROM, decreased strength, increased muscle spasms, impaired flexibility, impaired UE functional use,  and pain. These impairments are limiting patient from cleaning, community activity, driving, meal prep, occupation, laundry, and shopping. Personal factors including Profession and Time since onset of injury/illness/exacerbation are also affecting patient's functional outcome. Patient will benefit from skilled PT to address above impairments and improve overall function. ?  ?REHAB POTENTIAL: Good ?  ?CLINICAL DECISION MAKING: Stable/uncomplicated ?  ?EVALUATION COMPLEXITY: Low ?  ?  ?GOALS: ?Goals reviewed with patient? Yes ?  ?SHORT TERM GOALS: ?  ?STG Name Target Date Goal status  ?1 Pt will demonstrate progression with post op protocol ?Baseline: will progress as appropriate 12/14/2021 achieved  ?2 Independent in donning/doffing sling ?Baseline: educated at eval 11/30/2021 achieved  ?         ?         ?         ?         ?         ?  ?LONG TERM GOALS:  ?  ?LTG Name Target Date Goal status  ?1 Pt will demo proper form with lifting 10lb to overhead shelf ?Baseline: 01/25/2022 ongoing  ?2 Pt will demo ROM within 10 deg of Rt UE without compensation ?Baseline:unable at eval 01/25/2022 ongoing  ?3 Pt will demo at least 80% strength in Left arm as Rt in dynamometry testing ?Baseline: unable to test at eval 02/08/2022 ongoing  ?4 Pt will begin increased strength training for return work regarding lifting requirements ?Baseline: will progress as appropriate 02/08/2022 ongoing  ?5 Pt will verbalize pain <=2/10 with ADLs ?Baseline: unable to use Lt UE for ADLs at eval 02/08/2022 ongoing  ?         ?         ?  ?PLAN: ?PT FREQUENCY: 1-2x/week ?  ?PT DURATION: 12 weeks ?  ?PLANNED INTERVENTIONS: Therapeutic exercises, Therapeutic activity, Neuro Muscular re-education, Patient/Family education, Joint mobilization, Aquatic Therapy, Dry Needling, Electrical stimulation, Cryotherapy, Moist heat, scar mobilization, Taping, and Manual therapy ?  ?PLAN FOR NEXT SESSION: continue per protocol, DB rowing, light OH movement ? ? ?Zebedee Iba PT,  DPT ?01/06/22 2:24 PM ? ? ? ?  ?

## 2022-01-07 ENCOUNTER — Ambulatory Visit (HOSPITAL_COMMUNITY)
Admission: EM | Admit: 2022-01-07 | Discharge: 2022-01-07 | Disposition: A | Discharge status: Home / Self Care | Payer: BC Managed Care – PPO | Attending: Psychiatry | Admitting: Psychiatry

## 2022-01-07 DIAGNOSIS — F411 Generalized anxiety disorder: Secondary | ICD-10-CM | POA: Insufficient documentation

## 2022-01-07 DIAGNOSIS — F410 Panic disorder [episodic paroxysmal anxiety] without agoraphobia: Secondary | ICD-10-CM | POA: Diagnosis not present

## 2022-01-07 DIAGNOSIS — Z9151 Personal history of suicidal behavior: Secondary | ICD-10-CM | POA: Diagnosis not present

## 2022-01-07 DIAGNOSIS — Z20822 Contact with and (suspected) exposure to covid-19: Secondary | ICD-10-CM | POA: Diagnosis not present

## 2022-01-07 DIAGNOSIS — R45851 Suicidal ideations: Secondary | ICD-10-CM | POA: Insufficient documentation

## 2022-01-07 DIAGNOSIS — F32A Depression, unspecified: Secondary | ICD-10-CM | POA: Diagnosis not present

## 2022-01-07 DIAGNOSIS — F1010 Alcohol abuse, uncomplicated: Secondary | ICD-10-CM | POA: Insufficient documentation

## 2022-01-07 LAB — CBC WITH DIFFERENTIAL/PLATELET
Abs Immature Granulocytes: 0.05 10*3/uL (ref 0.00–0.07)
Basophils Absolute: 0 10*3/uL (ref 0.0–0.1)
Basophils Relative: 0 %
Eosinophils Absolute: 0.1 10*3/uL (ref 0.0–0.5)
Eosinophils Relative: 1 %
HCT: 41.9 % (ref 36.0–46.0)
Hemoglobin: 14.6 g/dL (ref 12.0–15.0)
Immature Granulocytes: 0 %
Lymphocytes Relative: 30 %
Lymphs Abs: 3.5 10*3/uL (ref 0.7–4.0)
MCH: 31.7 pg (ref 26.0–34.0)
MCHC: 34.8 g/dL (ref 30.0–36.0)
MCV: 90.9 fL (ref 80.0–100.0)
Monocytes Absolute: 0.8 10*3/uL (ref 0.1–1.0)
Monocytes Relative: 7 %
Neutro Abs: 7.4 10*3/uL (ref 1.7–7.7)
Neutrophils Relative %: 62 %
Platelets: 348 10*3/uL (ref 150–400)
RBC: 4.61 MIL/uL (ref 3.87–5.11)
RDW: 12.2 % (ref 11.5–15.5)
WBC: 11.9 10*3/uL — ABNORMAL HIGH (ref 4.0–10.5)
nRBC: 0 % (ref 0.0–0.2)

## 2022-01-07 LAB — RESP PANEL BY RT-PCR (FLU A&B, COVID) ARPGX2
Influenza A by PCR: NEGATIVE
Influenza B by PCR: NEGATIVE
SARS Coronavirus 2 by RT PCR: NEGATIVE

## 2022-01-07 LAB — POCT URINE DRUG SCREEN - MANUAL ENTRY (I-SCREEN)
POC Amphetamine UR: NOT DETECTED
POC Buprenorphine (BUP): NOT DETECTED
POC Cocaine UR: NOT DETECTED
POC Marijuana UR: POSITIVE — AB
POC Methadone UR: NOT DETECTED
POC Methamphetamine UR: NOT DETECTED
POC Morphine: NOT DETECTED
POC Oxazepam (BZO): POSITIVE — AB
POC Oxycodone UR: NOT DETECTED
POC Secobarbital (BAR): NOT DETECTED

## 2022-01-07 LAB — COMPREHENSIVE METABOLIC PANEL
ALT: 20 U/L (ref 0–44)
AST: 17 U/L (ref 15–41)
Albumin: 4.1 g/dL (ref 3.5–5.0)
Alkaline Phosphatase: 62 U/L (ref 38–126)
Anion gap: 8 (ref 5–15)
BUN: 5 mg/dL — ABNORMAL LOW (ref 6–20)
CO2: 24 mmol/L (ref 22–32)
Calcium: 9.3 mg/dL (ref 8.9–10.3)
Chloride: 107 mmol/L (ref 98–111)
Creatinine, Ser: 0.95 mg/dL (ref 0.44–1.00)
GFR, Estimated: >60 mL/min (ref >60)
Glucose, Bld: 87 mg/dL (ref 70–99)
Potassium: 4 mmol/L (ref 3.5–5.1)
Sodium: 139 mmol/L (ref 135–145)
Total Bilirubin: 0.6 mg/dL (ref 0.3–1.2)
Total Protein: 6.7 g/dL (ref 6.5–8.1)

## 2022-01-07 LAB — LIPID PANEL
Cholesterol: 185 mg/dL (ref 0–200)
HDL: 40 mg/dL — ABNORMAL LOW (ref >40)
LDL Cholesterol: 104 mg/dL — ABNORMAL HIGH (ref 0–99)
Total CHOL/HDL Ratio: 4.6 RATIO
Triglycerides: 204 mg/dL — ABNORMAL HIGH (ref <150)
VLDL: 41 mg/dL — ABNORMAL HIGH (ref 0–40)

## 2022-01-07 LAB — POC SARS CORONAVIRUS 2 AG: SARSCOV2ONAVIRUS 2 AG: NEGATIVE

## 2022-01-07 LAB — POC SARS CORONAVIRUS 2 AG -  ED: SARS Coronavirus 2 Ag: NEGATIVE

## 2022-01-07 LAB — ETHANOL: Alcohol, Ethyl (B): <10 mg/dL (ref <10)

## 2022-01-07 LAB — HEMOGLOBIN A1C
Hgb A1c MFr Bld: 5.2 % (ref 4.8–5.6)
Mean Plasma Glucose: 102.54 mg/dL

## 2022-01-07 LAB — POCT PREGNANCY, URINE: Preg Test, Ur: NEGATIVE

## 2022-01-07 LAB — TSH: TSH: 1.886 u[IU]/mL (ref 0.350–4.500)

## 2022-01-07 MED ORDER — MAGNESIUM HYDROXIDE 400 MG/5ML PO SUSP: 30.0000 mL | Freq: Every day | ORAL | Status: DC | PRN

## 2022-01-07 MED ORDER — THIAMINE HCL 100 MG/ML IJ SOLN
100.0000 mg | Freq: Once | INTRAMUSCULAR | Status: AC
  Administered 2022-01-07: 100 mg via INTRAMUSCULAR
  Filled 2022-01-07: qty 2

## 2022-01-07 MED ORDER — LORAZEPAM 1 MG PO TABS: 1.0000 mg | ORAL_TABLET | Freq: Four times a day (QID) | ORAL | Status: DC

## 2022-01-07 MED ORDER — THIAMINE HCL 100 MG PO TABS: 100.0000 mg | ORAL_TABLET | Freq: Every day | ORAL | Status: DC

## 2022-01-07 MED ORDER — LOPERAMIDE HCL 2 MG PO CAPS: 2.0000 mg | ORAL_CAPSULE | ORAL | Status: DC | PRN

## 2022-01-07 MED ORDER — LORAZEPAM 1 MG PO TABS: 1.0000 mg | ORAL_TABLET | Freq: Three times a day (TID) | ORAL | Status: DC

## 2022-01-07 MED ORDER — MIRTAZAPINE 15 MG PO TABS: 15.0000 mg | ORAL_TABLET | Freq: Every day | ORAL | Status: DC

## 2022-01-07 MED ORDER — LORAZEPAM 1 MG PO TABS: 1.0000 mg | ORAL_TABLET | Freq: Four times a day (QID) | ORAL | Status: DC | PRN

## 2022-01-07 MED ORDER — ALUM & MAG HYDROXIDE-SIMETH 200-200-20 MG/5ML PO SUSP: 30.0000 mL | ORAL | Status: DC | PRN

## 2022-01-07 MED ORDER — ONDANSETRON 4 MG PO TBDP: 4.0000 mg | ORAL_TABLET | Freq: Four times a day (QID) | ORAL | Status: DC | PRN

## 2022-01-07 MED ORDER — ADULT MULTIVITAMIN W/MINERALS CH
1.0000 | ORAL_TABLET | Freq: Every day | ORAL | Status: DC
  Administered 2022-01-07: 1 via ORAL
  Filled 2022-01-07: qty 1

## 2022-01-07 MED ORDER — LORAZEPAM 1 MG PO TABS: 1.0000 mg | ORAL_TABLET | Freq: Every day | ORAL | Status: DC

## 2022-01-07 MED ORDER — TRAZODONE HCL 50 MG PO TABS: 50.0000 mg | ORAL_TABLET | Freq: Every evening | ORAL | Status: DC | PRN

## 2022-01-07 MED ORDER — HYDROXYZINE HCL 25 MG PO TABS
25.0000 mg | ORAL_TABLET | Freq: Three times a day (TID) | ORAL | Status: DC | PRN
  Administered 2022-01-07: 25 mg via ORAL
  Filled 2022-01-07: qty 1

## 2022-01-07 MED ORDER — LORAZEPAM 1 MG PO TABS: 1.0000 mg | ORAL_TABLET | Freq: Two times a day (BID) | ORAL | Status: DC

## 2022-01-07 MED ORDER — ACETAMINOPHEN 325 MG PO TABS: 650.0000 mg | ORAL_TABLET | Freq: Four times a day (QID) | ORAL | Status: DC | PRN

## 2022-01-07 NOTE — Progress Notes (Signed)
Rhonda [name] received her discharge order, she was presented with her AVS, questions answered. She received her personal belongings and taxi voucher. She was escorted to the lobby where her taxi was waiting. ?
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
## 2022-01-07 NOTE — Discharge Instructions (Signed)
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
## 2022-01-07 NOTE — ED Provider Notes (Signed)
Behavioral Health Admission H&P ?(FBC & OBS) ? ?Date: 01/07/22 ?Patient Name: Rhonda [name] ?MRN: [id number] ?Chief Complaint: No chief complaint on file. ?   ? ?Diagnoses:  ?Final diagnoses:  ?Depression, unspecified depression type  ?GAD (generalized anxiety disorder)  ?Alcohol abuse  ?Panic disorder  ? ? ?HPI: Rhonda [name],  31 y.o female present to [facility] for increase anxiety, panic Attach, depression and remote suicidal ideation. Pt was seen at [facility] 2 day ago but she left because she had to attend court to pay her bills.  Pt is now back and report she is ready to get the needed help, this way she can move forward with her life.   ? ?Observation of patient she is alert and oriented x 4,  speech clear,  maintain eye contact.  Affect anxious,  mood congruent with affect.  Pt report she has taken care of paying her rent from the time she was here 2 days ago, she since then had spoken with her psychologist,  but deem she need inpatient treatment to deal with her depression.  Per the patient she has always had suicidal thought but no plan to act upon them.   ? ?Pt did reminisced on the loosing her brother and the most recent news about her mother breast cancer.  Pt describe all the above as too much to bear and she need help so she can move forward with her life.  Pt report on-going SI that is on and off,  she report increase anxiety with panic attach, and depression.  Pt denies AVH,  HI or paranoia.   According to patient she used marijuana and alcohol today.   Pt stay that she feel coming her is better than home because she will have someone to talk too about her issues.  ? ?Recommend inpatient FBC  ? ?PHQ 2-9:   ?Flowsheet Row ED from 06/26/2021 in [facility] EMERGENCY DEPARTMENT  ?C-SSRS RISK CATEGORY No Risk  ? ?  ?  ? ?Total Time spent with patient: 20 minutes ? ?Musculoskeletal  ?Strength & Muscle Tone: within normal limits ?Gait & Station: normal ?Patient leans: N/A ? ?Psychiatric Specialty Exam   ?Presentation ?General Appearance: Appropriate for Environment ? ?Eye Contact:Good ? ?Speech:Clear and Coherent ? ?Speech Volume:Normal ? ?Handedness:Ambidextrous ? ? ?Mood and Affect  ?Mood:Anxious; Depressed; Hopeless ? ?Affect:Appropriate ? ? ?Thought Process  ?Thought Processes:Coherent ? ?Descriptions of Associations:Circumstantial ? ?Orientation:Full (Time, Place and Person) ? ?Thought Content:No data recorded   ?Hallucinations:Hallucinations: None ? ?Ideas of Reference:None ? ?Suicidal Thoughts:Suicidal Thoughts: Yes, Passive ?SI Passive Intent and/or Plan: Without Intent ? ?Homicidal Thoughts:Homicidal Thoughts: No ? ? ?Sensorium  ?Memory:Immediate Good ? ?Judgment:Good ? ?Insight:Good ? ? ?Executive Functions  ?Concentration:Good ? ?Attention Span:Good ? ?Recall:Good ? ?Fund of Knowledge:Good ? ?Language:Good ? ? ?Psychomotor Activity  ?Psychomotor Activity:No data recorded ? ?Assets  ?Assets:No data recorded ? ?Sleep  ?Sleep:Sleep: Poor ?Number of Hours of Sleep: 4 ? ? ?Nutritional Assessment (For OBS and FBC admissions only) ?Has the patient had a weight loss or gain of 10 pounds or more in the last 3 months?: No ?Has the patient had a decrease in food intake/or appetite?: Yes ?Does the patient have dental problems?: No ?Does the patient have eating habits or behaviors that may be indicators of an eating disorder including binging or inducing vomiting?: No ?Has the patient recently lost weight without trying?: 0 ?Has the patient been eating poorly because of a decreased appetite?: 0 ?Malnutrition Screening Tool Score: 0 ? ? ? ?  Physical Exam ?Constitutional:   ?   Appearance: Normal appearance.  ?HENT:  ?   Head: Normocephalic.  ?   Nose: Nose normal.  ?Cardiovascular:  ?   Rate and Rhythm: Normal rate.  ?Pulmonary:  ?   Effort: Pulmonary effort is normal.  ?Musculoskeletal:     ?   General: Normal range of motion.  ?   Cervical back: Normal range of motion.  ?Skin: ?   General: Skin is warm.   ?Neurological:  ?   General: No focal deficit present.  ?   Mental Status: She is alert.  ?Psychiatric:     ?   Mood and Affect: Mood normal.     ?   Behavior: Behavior normal.     ?   Thought Content: Thought content normal.  ? ?Review of Systems  ?Constitutional: Negative.   ?HENT: Negative.    ?Eyes: Negative.   ?Respiratory: Negative.    ?Cardiovascular: Negative.   ?Gastrointestinal: Negative.   ?Genitourinary: Negative.   ?Musculoskeletal: Negative.   ?Skin: Negative.   ?Neurological: Negative.   ?Endo/Heme/Allergies: Negative.   ?Psychiatric/Behavioral:  Positive for depression, substance abuse and suicidal ideas. The patient is nervous/anxious.   ? ?Blood pressure 126/82, pulse 88, temperature 98.9 ?F (37.2 ?C), temperature source Oral, resp. rate 18, SpO2 99 %, unknown if currently breastfeeding. There is no height or weight on file to calculate BMI. ? ?Past Psychiatric History: depression,  anxiety   ? ?Is the patient at risk to self? Yes  ?Has the patient been a risk to self in the past 6 months? Yes .    ?Has the patient been a risk to self within the distant past? Yes   ?Is the patient a risk to others? No   ?Has the patient been a risk to others in the past 6 months? No   ?Has the patient been a risk to others within the distant past? No  ? ?Past Medical History:  ?Past Medical History:  ?Diagnosis Date  ?? Medical history non-contributory   ?? Shoulder injury   ?  ?Past Surgical History:  ?Procedure Laterality Date  ?? SHOULDER ARTHROSCOPY WITH LABRAL REPAIR Left 11/11/2021  ? Procedure: Left SHOULDER ARTHROSCOPY WITH LABRAL REPAIR;  Surgeon: Huel Cote, MD;  Location: [facility];  Service: Orthopedics;  Laterality: Left;  ?? TONSILLECTOMY    ?? wisdom teeth    ? ? ?Family History:  ?Family History  ?Problem Relation Age of Onset  ?? Hypertension Other   ? ? ?Social History:  ?Social History  ? ?Socioeconomic History  ?? Marital status: Single  ?  Spouse name: Not on file  ?? Number  of children: Not on file  ?? Years of education: Not on file  ?? Highest education level: Not on file  ?Occupational History  ?? Not on file  ?Tobacco Use  ?? Smoking status: Former  ?  Types: Cigarettes  ?  Quit date: 09/30/2012  ?  Years since quitting: 9.2  ?? Smokeless tobacco: Never  ?Vaping Use  ?? Vaping Use: Never used  ?Substance and Sexual Activity  ?? Alcohol use: Not Currently  ?? Drug use: Not Currently  ?? Sexual activity: Yes  ?  Birth control/protection: None  ?Other Topics Concern  ?? Not on file  ?Social History Narrative  ?? Not on file  ? ?Social Determinants of Health  ? ?Financial Resource Strain: Not on file  ?Food Insecurity: Not on file  ?Transportation Needs: Not on file  ?  Physical Activity: Not on file  ?Stress: Not on file  ?Social Connections: Not on file  ?Intimate Partner Violence: Not on file  ? ? ?SDOH:  ?SDOH Screenings  ? ?Alcohol Screen: Not on file  ?Depression (PHQ2-9): Not on file  ?Financial Resource Strain: Not on file  ?Food Insecurity: Not on file  ?Housing: Not on file  ?Physical Activity: Not on file  ?Social Connections: Not on file  ?Stress: Not on file  ?Tobacco Use: Medium Risk  ?? Smoking Tobacco Use: Former  ?? Smokeless Tobacco Use: Never  ?? Passive Exposure: Not on file  ?Transportation Needs: Not on file  ? ? ?Last Labs:  ?Admission on 11/11/2021, Discharged on 11/11/2021  ?Component Date Value Ref Range Status  ?? Preg Test, Ur 11/11/2021 NEGATIVE  NEGATIVE Final  ? Comment:        ?THE SENSITIVITY OF THIS ?METHODOLOGY IS >24 mIU/mL ?  ? ? ?Allergies: Patient has no known allergies. ? ?PTA Medications: (Not in a hospital admission) ? ? ?Medical Decision Making  ?Inpatient FBC with continued evaluation  ?  ?Lab Orders    ?     Resp Panel by RT-PCR (Flu A&B, Covid) Nasopharyngeal Swab    ?     CBC with Differential/Platelet    ?     Comprehensive metabolic panel    ?     Hemoglobin A1c    ?     Ethanol    ?     Lipid panel    ?     TSH    ?     Pregnancy, urine     ?     POCT Urine Drug Screen - ([facility])    ?     POC SARS Coronavirus 2 Ag-ED - Nasal Swab    ?     POC SARS Coronavirus 2 Ag    ?     Pregnancy, urine POC     ? ?Meds ordered this encounter  ?Medications  ?? acet

## 2022-01-07 NOTE — Progress Notes (Signed)
Received Rhonda [name] this AM asleep in her bed, she refused breakfast this AM. She continued to sleep in this morning.   ?

## 2022-01-07 NOTE — Progress Notes (Addendum)
Rhonda [name] ate lunch, compliant with her medication and afterwards colored. She remained restless and was given Vistaril per request. She completed her safety plan and a copy was placed in her chart. ?
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
## 2022-01-07 NOTE — ED Notes (Signed)
Pt admitted to [facility] due to depression and anxiety. Pt reports having SI with no plan a few days ago. Pt denies current SI/HI/AVH. Pt A&O x4, calm and cooperative. Pt tolerated admission process and skin assessment well. Pt ambulated independently to unit. Oriented to unit/staff. Denies any immediate needs. No signs of acute distress noted. Will continue to monitor for safety.  ?

## 2022-01-07 NOTE — ED Notes (Signed)
Pt refused breakfast 
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
## 2022-01-07 NOTE — ED Notes (Signed)
Pt. Is talking with nurse in her room. ?

## 2022-01-07 NOTE — ED Notes (Addendum)
Pt given [place] sandwich, graham crackers, and rice krispie treat per request. Pt remains in assessment room per Wyvonnia Dusky, RN/AC until PCR COVID test results to transfer to [facility]  ?

## 2022-01-11 ENCOUNTER — Telehealth (HOSPITAL_COMMUNITY): Payer: Self-pay | Admitting: Professional

## 2022-01-11 ENCOUNTER — Encounter (HOSPITAL_BASED_OUTPATIENT_CLINIC_OR_DEPARTMENT_OTHER): Payer: Self-pay | Admitting: Physical Therapy

## 2022-01-11 ENCOUNTER — Ambulatory Visit (HOSPITAL_BASED_OUTPATIENT_CLINIC_OR_DEPARTMENT_OTHER): Payer: BC Managed Care – PPO | Admitting: Physical Therapy

## 2022-01-11 DIAGNOSIS — M6281 Muscle weakness (generalized): Secondary | ICD-10-CM

## 2022-01-11 DIAGNOSIS — M25512 Pain in left shoulder: Secondary | ICD-10-CM | POA: Diagnosis not present

## 2022-01-11 NOTE — Therapy (Signed)
?OUTPATIENT PHYSICAL THERAPY TREATMENT NOTE ? ? ?Patient Name: Rhonda [name] ?MRN: [id number] ?DOB:1991-04-24, 31 y.o., female ?Today's Date: 01/11/2022 ? ?PCP: Patient, No Pcp Per (Inactive) ?REFERRING PROVIDER: [name], MD ? ? PT End of Session - 01/11/22 1024   ? ? Visit Number 13   ? Number of Visits 25   ? Date for PT Re-Evaluation 02/11/22   ? Authorization Type BCBS   ? PT Start Time 1024   pt arrived late  ? PT Stop Time 1057   ? PT Time Calculation (min) 33 min   ? Activity Tolerance Patient tolerated treatment well   ? Behavior During Therapy [facility] for tasks assessed/performed   ? ?  ?  ? ?  ? ? ? ? ? ? ? ? ?Past Medical History:  ?Diagnosis Date  ? Medical history non-contributory   ? Shoulder injury   ? ?Past Surgical History:  ?Procedure Laterality Date  ? SHOULDER ARTHROSCOPY WITH LABRAL REPAIR Left 11/11/2021  ? Procedure: Left SHOULDER ARTHROSCOPY WITH LABRAL REPAIR;  Surgeon: [name], MD;  Location: [place];  Service: Orthopedics;  Laterality: Left;  ? TONSILLECTOMY    ? wisdom teeth    ? ?Patient Active Problem List  ? Diagnosis Date Noted  ? Tear of left glenoid labrum   ? Hyperemesis gravidarum 01/08/2020  ? Leukocytosis 01/08/2020  ? Abnormal uterine bleeding 10/11/2018  ? ? ?REFERRING DIAG: S/p Lt labral repair ? ?THERAPY DIAG:  ?Acute pain of left shoulder ? ?Muscle weakness (generalized) ? ?PERTINENT HISTORY: none ? ?PRECAUTIONS: shoulder, DOS 11/11/21 ? ?SUBJECTIVE:  A little more sore because I am using it more. External rotation in sidelying is the hardest right now.  ?PAIN:  ?Are you having pain? Yes ?NPRS scale: 2/10 ?Pain location: Lt shoulder ? ?Aggravating factors: laying on it ?Relieving factors: meds, ice ? ?OBJECTIVE:  ?  ?PATIENT SURVEYS:  ?FOTO 25 ?FOTO: 50.2 at 7th VISIT ?FOTO: 10th visit 57 pts ?FOTO: 11th visit  ? ?4/4: AROM flexion 160 ?ABD 148 ?IR at 0 to belly, BHB reaching to waistline  ?ER at 0 55 ? ?MMT: formal MMT/HHD testing not indicated at  this time given surgical healing timeline ?         ?TODAY'S TREATMENT:  ? ?4/11: ?MANUAL: IASTM to shoulder- Trigger point release to infraspinatus and upper trap ?Biceps curl from abduction- 1 lb weight; progressed to abd press to 90; progressed to [facility] press <>90 ?Bent over row 6lb- also with green band added to HEP ?Overhead ball on wall- circles & iso pres ? ?4/6 ?Pulley's flexion and ABD 2 min each ? ?Bent over row 3x10 5lb ?Standing scaption 2lb and ABD 2x10 ?GTB and ext rows 3x10 ?Wall ball circles 20x CW and CCW ?Wall push up 2x10 ?5lb supine protraction 2x10 ? S/L 2lbs 2x10 ? Supine flexion 2lb 2x10 ? Standing IR strap stretch 3s 15x ? ?4/4 ? ?Joint mobilization with ABD and inf; grade III inf and post mob ? ? ?PROM to protocol limits ? ?RTB rows 3x10 ?Standing scaption 1lb and ABD 2x10 ?Wall ball circles 20x CW and CCW ?Wall push up 2x10 ?Standing wall protraction 2x10 ?ER YTB 2x10  ? ? ? ?  ?PATIENT EDUCATION: ?Education details: relevant anatomy, exercise progression, DOMS expectations, muscle firing,  envelope of function, HEP ?  ?Person educated: Patient ?Education method: Explanation, Demonstration, Tactile cues, Verbal cues, and Handouts- text HEP ?Education comprehension: verbalized understanding, returned demonstration, verbal cues required, tactile cues required, and needs  further education ?  ?  ?HOME EXERCISE PROGRAM: ?Access Code: [email or web address] ?URL: [email or web address] ? ?  ?ASSESSMENT: ?  ?CLINICAL IMPRESSION: ?Tactile cues to reduce extension that is limiting retraction control. Trigger points in upper trap and infraspinatus will need to be addressed again.  ?  ?Objective impairments include decreased activity tolerance, decreased knowledge of condition, decreased ROM, decreased strength, increased muscle spasms, impaired flexibility, impaired UE functional use, and pain. These impairments are limiting patient from cleaning, community activity, driving, meal prep, occupation,  laundry, and shopping. Personal factors including Profession and Time since onset of injury/illness/exacerbation are also affecting patient's functional outcome. Patient will benefit from skilled PT to address above impairments and improve overall function. ?  ?REHAB POTENTIAL: Good ?  ?CLINICAL DECISION MAKING: Stable/uncomplicated ?  ?EVALUATION COMPLEXITY: Low ?  ?  ?GOALS: ?Goals reviewed with patient? Yes ?  ?SHORT TERM GOALS: ?  ?STG Name Target Date Goal status  ?1 Pt will demonstrate progression with post op protocol ?Baseline: will progress as appropriate 12/14/2021 achieved  ?2 Independent in donning/doffing sling ?Baseline: educated at eval 11/30/2021 achieved  ?         ?         ?         ?         ?         ?  ?LONG TERM GOALS:  ?  ?LTG Name Target Date Goal status  ?1 Pt will demo proper form with lifting 10lb to overhead shelf ?Baseline: 01/25/2022 ongoing  ?2 Pt will demo ROM within 10 deg of Rt UE without compensation ?Baseline:unable at eval 01/25/2022 ongoing  ?3 Pt will demo at least 80% strength in Left arm as Rt in dynamometry testing ?Baseline: unable to test at eval 02/08/2022 ongoing  ?4 Pt will begin increased strength training for return work regarding lifting requirements ?Baseline: will progress as appropriate 02/08/2022 ongoing  ?5 Pt will verbalize pain <=2/10 with ADLs ?Baseline: unable to use Lt UE for ADLs at eval 02/08/2022 ongoing  ?         ?         ?  ?PLAN: ?PT FREQUENCY: 1-2x/week ?  ?PT DURATION: 12 weeks ?  ?PLANNED INTERVENTIONS: Therapeutic exercises, Therapeutic activity, Neuro Muscular re-education, Patient/Family education, Joint mobilization, Aquatic Therapy, Dry Needling, Electrical stimulation, Cryotherapy, Moist heat, scar mobilization, Taping, and Manual therapy ?  ?PLAN FOR NEXT SESSION: continue per protocol, light OH, full range challenges  ? ? ?Patrik Turnbaugh C. Setareh Rom PT, DPT ?01/11/22 10:58 AM ? ? ? ?  ?

## 2022-01-12 ENCOUNTER — Ambulatory Visit: Payer: BC Managed Care – PPO | Admitting: Psychologist

## 2022-01-13 ENCOUNTER — Ambulatory Visit (HOSPITAL_BASED_OUTPATIENT_CLINIC_OR_DEPARTMENT_OTHER): Payer: BC Managed Care – PPO | Admitting: Physical Therapy

## 2022-01-13 ENCOUNTER — Encounter (HOSPITAL_BASED_OUTPATIENT_CLINIC_OR_DEPARTMENT_OTHER): Payer: Self-pay | Admitting: Physical Therapy

## 2022-01-13 DIAGNOSIS — M25512 Pain in left shoulder: Secondary | ICD-10-CM

## 2022-01-13 DIAGNOSIS — M6281 Muscle weakness (generalized): Secondary | ICD-10-CM

## 2022-01-13 NOTE — Therapy (Signed)
?OUTPATIENT PHYSICAL THERAPY TREATMENT NOTE ? ? ?Patient Name: Rhonda [name] ?MRN: [id number] ?DOB:[birth year], 31 y.o., female ?Today's Date: 01/13/2022 ? ?PCP: Patient, No Pcp Per (Inactive) ?REFERRING PROVIDER: Huel Cote, MD ? ? PT End of Session - 01/13/22 1428   ? ? Visit Number 14   ? Number of Visits 25   ? Date for PT Re-Evaluation 02/11/22   ? Authorization Type BCBS   ? PT Start Time 1402   arrives late  ? PT Stop Time 1428   ? PT Time Calculation (min) 26 min   ? Activity Tolerance Patient tolerated treatment well   ? Behavior During Therapy [facility] for tasks assessed/performed   ? ?  ?  ? ?  ? ? ? ? ? ? ? ? ? ?Past Medical History:  ?Diagnosis Date  ? Medical history non-contributory   ? Shoulder injury   ? ?Past Surgical History:  ?Procedure Laterality Date  ? SHOULDER ARTHROSCOPY WITH LABRAL REPAIR Left 11/11/2021  ? Procedure: Left SHOULDER ARTHROSCOPY WITH LABRAL REPAIR;  Surgeon: Huel Cote, MD;  Location: [facility];  Service: Orthopedics;  Laterality: Left;  ? TONSILLECTOMY    ? wisdom teeth    ? ?Patient Active Problem List  ? Diagnosis Date Noted  ? Tear of left glenoid labrum   ? Hyperemesis gravidarum 01/08/2020  ? Leukocytosis 01/08/2020  ? Abnormal uterine bleeding 10/11/2018  ? ? ?REFERRING DIAG: S/p Lt labral repair ? ?THERAPY DIAG:  ?Acute pain of left shoulder ? ?Muscle weakness (generalized) ? ?PERTINENT HISTORY: none ? ?PRECAUTIONS: shoulder, DOS 11/11/21 ? ?SUBJECTIVE:  Pt states theere is no pain today. The STM helped a lot from last session. Pt is still stiff behind her back as well as reaching outwards. ? ?PAIN:  ?Are you having pain? Yes ?NPRS scale: 0/10 ?Pain location: Lt shoulder ? ?Aggravating factors: laying on it ?Relieving factors: meds, ice ? ?OBJECTIVE:  ?  ?PATIENT SURVEYS:  ?FOTO 25 ?FOTO: 50.2 at 7th VISIT ?FOTO: 10th visit 57 pts ?FOTO: 11th visit  ? ?4/4: AROM flexion 160 ?ABD 148 ?IR at 0 to belly, BHB reaching to waistline  ?ER at 0 55 ? ?MMT: formal  MMT/HHD testing not indicated at this time given surgical healing timeline ?         ?TODAY'S TREATMENT:  ? ?4/13: ?MANUAL: STM L UT, levator, rhomboids, infra ? ?4/11: ?MANUAL: IASTM to shoulder- Trigger point release to infraspinatus and upper trap ?Biceps curl from abduction- 1 lb weight; progressed to abd press to 90; progressed to [facility] press <>90 ?Bent over row 6lb- also with green band added to HEP ?Overhead ball on wall- circles & iso pres ? ?4/6 ?Pulley's flexion and ABD 2 min each ? ?Bent over row 3x10 5lb ?Standing scaption 2lb and ABD 2x10 ?GTB and ext rows 3x10 ?Wall ball circles 20x CW and CCW ?Wall push up 2x10 ?5lb supine protraction 2x10 ? S/L 2lbs 2x10 ? Supine flexion 2lb 2x10 ? Standing IR strap stretch 3s 15x ? ?4/4 ? ?Joint mobilization with ABD and inf; grade III inf and post mob ? ? ?PROM to protocol limits ? ?RTB rows 3x10 ?Standing scaption 1lb and ABD 2x10 ?Wall ball circles 20x CW and CCW ?Wall push up 2x10 ?Standing wall protraction 2x10 ?ER YTB 2x10  ? ? ? ?  ?PATIENT EDUCATION: ?Education details: relevant anatomy, exercise progression, DOMS expectations, muscle firing,  envelope of function, HEP ?  ?Person educated: Patient ?Education method: Explanation, Demonstration, Tactile cues, Verbal cues, and  Handouts- text HEP ?Education comprehension: verbalized understanding, returned demonstration, verbal cues required, tactile cues required, and needs further education ?  ?  ?HOME EXERCISE PROGRAM: ?Access Code: [email or web address] ?URL: [email or web address] ? ?  ?ASSESSMENT: ?  ?CLINICAL IMPRESSION: ?Pt session limited by time. Pt had improvement in shoulder soft tissue extensibility following STM. Pt has good OH ROM but is still limited with IR and feels stiffness into ABD. Plan to perform joint mobs and progress IR stretching along with strength as tolerated at next. ?  ?Objective impairments include decreased activity tolerance, decreased knowledge of condition, decreased ROM,  decreased strength, increased muscle spasms, impaired flexibility, impaired UE functional use, and pain. These impairments are limiting patient from cleaning, community activity, driving, meal prep, occupation, laundry, and shopping. Personal factors including Profession and Time since onset of injury/illness/exacerbation are also affecting patient's functional outcome. Patient will benefit from skilled PT to address above impairments and improve overall function. ?  ?REHAB POTENTIAL: Good ?  ?CLINICAL DECISION MAKING: Stable/uncomplicated ?  ?EVALUATION COMPLEXITY: Low ?  ?  ?GOALS: ?Goals reviewed with patient? Yes ?  ?SHORT TERM GOALS: ?  ?STG Name Target Date Goal status  ?1 Pt will demonstrate progression with post op protocol ?Baseline: will progress as appropriate 12/14/2021 achieved  ?2 Independent in donning/doffing sling ?Baseline: educated at eval 11/30/2021 achieved  ?         ?         ?         ?         ?         ?  ?LONG TERM GOALS:  ?  ?LTG Name Target Date Goal status  ?1 Pt will demo proper form with lifting 10lb to overhead shelf ?Baseline: 01/25/2022 ongoing  ?2 Pt will demo ROM within 10 deg of Rt UE without compensation ?Baseline:unable at eval 01/25/2022 ongoing  ?3 Pt will demo at least 80% strength in Left arm as Rt in dynamometry testing ?Baseline: unable to test at eval 02/08/2022 ongoing  ?4 Pt will begin increased strength training for return work regarding lifting requirements ?Baseline: will progress as appropriate 02/08/2022 ongoing  ?5 Pt will verbalize pain <=2/10 with ADLs ?Baseline: unable to use Lt UE for ADLs at eval 02/08/2022 ongoing  ?         ?         ?  ?PLAN: ?PT FREQUENCY: 1-2x/week ?  ?PT DURATION: 12 weeks ?  ?PLANNED INTERVENTIONS: Therapeutic exercises, Therapeutic activity, Neuro Muscular re-education, Patient/Family education, Joint mobilization, Aquatic Therapy, Dry Needling, Electrical stimulation, Cryotherapy, Moist heat, scar mobilization, Taping, and Manual therapy ?   ?PLAN FOR NEXT SESSION: continue per protocol, light OH, full range challenges  ? ?Zebedee Iba PT, DPT ?01/13/22 2:31 PM ? ? ? ? ?  ?

## 2022-01-18 ENCOUNTER — Encounter (HOSPITAL_BASED_OUTPATIENT_CLINIC_OR_DEPARTMENT_OTHER): Payer: Self-pay | Admitting: Physical Therapy

## 2022-01-18 ENCOUNTER — Ambulatory Visit (HOSPITAL_BASED_OUTPATIENT_CLINIC_OR_DEPARTMENT_OTHER): Payer: BC Managed Care – PPO | Admitting: Physical Therapy

## 2022-01-18 DIAGNOSIS — M6281 Muscle weakness (generalized): Secondary | ICD-10-CM

## 2022-01-18 DIAGNOSIS — M25512 Pain in left shoulder: Secondary | ICD-10-CM

## 2022-01-18 NOTE — Therapy (Signed)
?OUTPATIENT PHYSICAL THERAPY TREATMENT NOTE ? ? ?Patient Name: Rhonda [name] ?MRN: [id number] ?DOB:Aug 25, 1991, 31 y.o., female ?Today's Date: 01/18/2022 ? ?PCP: Patient, No Pcp Per (Inactive) ?REFERRING PROVIDER: Huel Cote, MD ? ? PT End of Session - 01/18/22 1122   ? ? Visit Number 15   ? Number of Visits 25   ? Date for PT Re-Evaluation 02/11/22   ? Authorization Type BCBS   ? PT Start Time 1100   ? PT Stop Time 1138   ? PT Time Calculation (min) 38 min   ? Activity Tolerance Patient tolerated treatment well   ? Behavior During Therapy [facility] for tasks assessed/performed   ? ?  ?  ? ?  ? ? ? ? ? ? ? ? ? ? ?Past Medical History:  ?Diagnosis Date  ? Medical history non-contributory   ? Shoulder injury   ? ?Past Surgical History:  ?Procedure Laterality Date  ? SHOULDER ARTHROSCOPY WITH LABRAL REPAIR Left 11/11/2021  ? Procedure: Left SHOULDER ARTHROSCOPY WITH LABRAL REPAIR;  Surgeon: Huel Cote, MD;  Location: [facility];  Service: Orthopedics;  Laterality: Left;  ? TONSILLECTOMY    ? wisdom teeth    ? ?Patient Active Problem List  ? Diagnosis Date Noted  ? Tear of left glenoid labrum   ? Hyperemesis gravidarum 01/08/2020  ? Leukocytosis 01/08/2020  ? Abnormal uterine bleeding 10/11/2018  ? ? ?REFERRING DIAG: S/p Lt labral repair ? ?THERAPY DIAG:  ?Acute pain of left shoulder ? ?Muscle weakness (generalized) ? ?PERTINENT HISTORY: none ? ?PRECAUTIONS: shoulder, DOS 11/11/21 ? ?SUBJECTIVE:  Pt states theere is no pain today. Pt states it is a little sore with IR stretching at home.  ? ?PAIN:  ?Are you having pain? Yes ?NPRS scale: 0/10 ?Pain location: Lt shoulder ? ?Aggravating factors: laying on it ?Relieving factors: meds, ice ? ?OBJECTIVE:  ?  ?PATIENT SURVEYS:  ?FOTO 25 ?FOTO: 50.2 at 7th VISIT ?FOTO: 10th visit 57 pts ?FOTO: 11th visit  ? ?4/4: AROM flexion 160 ?ABD 148 ?IR at 0 to belly, BHB reaching to waistline  ?ER at 0 55 ? ?MMT: formal MMT/HHD testing not indicated at this time given surgical  healing timeline ?         ?TODAY'S TREATMENT:  ? ?4/18:  MANUAL:  joint most post and inf grade III ? ?Post capsule stretch 30s 3x ?Cane ext with ADD 3s 10x  ?IR cane stretch 10s 10x ?S/L ER 2lbs 3x10 ?Arnold press  2lbs 3x10 (VC for rotation and scap position) ?S/L ABD 2lbs 2x10 (focus on eccentric) ?OH triceps stretch 30s 3x ? ?UBE fwd and retro 2 min each ? ?4/13: ?MANUAL: STM L UT, levator, rhomboids, infra ? ?4/11: ?MANUAL: IASTM to shoulder- Trigger point release to infraspinatus and upper trap ?Biceps curl from abduction- 1 lb weight; progressed to abd press to 90; progressed to [facility] press <>90 ?Bent over row 6lb- also with green band added to HEP ?Overhead ball on wall- circles & iso pres ? ?4/6 ?Pulley's flexion and ABD 2 min each ? ?Bent over row 3x10 5lb ?Standing scaption 2lb and ABD 2x10 ?GTB and ext rows 3x10 ?Wall ball circles 20x CW and CCW ?Wall push up 2x10 ?5lb supine protraction 2x10 ? S/L 2lbs 2x10 ? Supine flexion 2lb 2x10 ? Standing IR strap stretch 3s 15x ? ?4/4 ? ?Joint mobilization with ABD and inf; grade III inf and post mob ? ? ?PROM to protocol limits ? ?RTB rows 3x10 ?Standing scaption 1lb and ABD  2x10 ?Wall ball circles 20x CW and CCW ?Wall push up 2x10 ?Standing wall protraction 2x10 ?ER YTB 2x10  ? ? ? ?  ?PATIENT EDUCATION: ?Education details: relevant anatomy, exercise progression, DOMS expectations, muscle firing,  envelope of function, HEP ?  ?Person educated: Patient ?Education method: Explanation, Demonstration, Tactile cues, Verbal cues, and Handouts- text HEP ?Education comprehension: verbalized understanding, returned demonstration, verbal cues required, tactile cues required, and needs further education ?  ?  ?HOME EXERCISE PROGRAM: ?Access Code: [email or web address] ?URL: [email or web address] ? ?  ?ASSESSMENT: ?  ?CLINICAL IMPRESSION: ?Pt found improved IR and reaching OH ROM following joint mob. Pt able to also progress OH strengthening and scapular stability without  increasing pain. Pt with expected fatigue with increasing intensity. Pt progressing well and is largely strength limited but with increased baseline soreness with stretching. Plan to address joint mobility as needed with mobs. Progress with strength as tolerated. Pt advised to decrease strengthening to 3x/week vs 4x. ?  ?Objective impairments include decreased activity tolerance, decreased knowledge of condition, decreased ROM, decreased strength, increased muscle spasms, impaired flexibility, impaired UE functional use, and pain. These impairments are limiting patient from cleaning, community activity, driving, meal prep, occupation, laundry, and shopping. Personal factors including Profession and Time since onset of injury/illness/exacerbation are also affecting patient's functional outcome. Patient will benefit from skilled PT to address above impairments and improve overall function. ?  ?REHAB POTENTIAL: Good ?  ?CLINICAL DECISION MAKING: Stable/uncomplicated ?  ?EVALUATION COMPLEXITY: Low ?  ?  ?GOALS: ?Goals reviewed with patient? Yes ?  ?SHORT TERM GOALS: ?  ?STG Name Target Date Goal status  ?1 Pt will demonstrate progression with post op protocol ?Baseline: will progress as appropriate 12/14/2021 achieved  ?2 Independent in donning/doffing sling ?Baseline: educated at eval 11/30/2021 achieved  ?         ?         ?         ?         ?         ?  ?LONG TERM GOALS:  ?  ?LTG Name Target Date Goal status  ?1 Pt will demo proper form with lifting 10lb to overhead shelf ?Baseline: 01/25/2022 ongoing  ?2 Pt will demo ROM within 10 deg of Rt UE without compensation ?Baseline:unable at eval 01/25/2022 ongoing  ?3 Pt will demo at least 80% strength in Left arm as Rt in dynamometry testing ?Baseline: unable to test at eval 02/08/2022 ongoing  ?4 Pt will begin increased strength training for return work regarding lifting requirements ?Baseline: will progress as appropriate 02/08/2022 ongoing  ?5 Pt will verbalize pain <=2/10  with ADLs ?Baseline: unable to use Lt UE for ADLs at eval 02/08/2022 ongoing  ?         ?         ?  ?PLAN: ?PT FREQUENCY: 1-2x/week ?  ?PT DURATION: 12 weeks ?  ?PLANNED INTERVENTIONS: Therapeutic exercises, Therapeutic activity, Neuro Muscular re-education, Patient/Family education, Joint mobilization, Aquatic Therapy, Dry Needling, Electrical stimulation, Cryotherapy, Moist heat, scar mobilization, Taping, and Manual therapy ?  ?PLAN FOR NEXT SESSION: continue per protocol, light OH, full range challenges  ? ?Zebedee Iba PT, DPT ?01/18/22 11:38 AM ? ? ? ? ?  ?

## 2022-01-20 ENCOUNTER — Ambulatory Visit (HOSPITAL_BASED_OUTPATIENT_CLINIC_OR_DEPARTMENT_OTHER): Payer: BC Managed Care – PPO | Admitting: Physical Therapy

## 2022-01-20 NOTE — Therapy (Incomplete)
?OUTPATIENT PHYSICAL THERAPY TREATMENT NOTE ? ? ?Patient Name: Rhonda [name] ?MRN: [id number] ?DOB:1990/12/04, 31 y.o., female ?Today's Date: 01/20/2022 ? ?PCP: Patient, No Pcp Per (Inactive) ?REFERRING PROVIDER: [name], MD ? ? ? ? ? ? ? ? ? ? ? ? ?Past Medical History:  ?Diagnosis Date  ? Medical history non-contributory   ? Shoulder injury   ? ?Past Surgical History:  ?Procedure Laterality Date  ? SHOULDER ARTHROSCOPY WITH LABRAL REPAIR Left 11/11/2021  ? Procedure: Left SHOULDER ARTHROSCOPY WITH LABRAL REPAIR;  Surgeon: [name], MD;  Location: [place];  Service: Orthopedics;  Laterality: Left;  ? TONSILLECTOMY    ? wisdom teeth    ? ?Patient Active Problem List  ? Diagnosis Date Noted  ? Tear of left glenoid labrum   ? Hyperemesis gravidarum 01/08/2020  ? Leukocytosis 01/08/2020  ? Abnormal uterine bleeding 10/11/2018  ? ? ?REFERRING DIAG: S/p Lt labral repair ? ?THERAPY DIAG:  ?No diagnosis found. ? ?PERTINENT HISTORY: none ? ?PRECAUTIONS: shoulder, DOS 11/11/21 ? ?SUBJECTIVE:  Pt states theere is no pain today. Pt states it is a little sore with IR stretching at home.  ? ?PAIN:  ?Are you having pain? Yes ?NPRS scale: 0/10 ?Pain location: Lt shoulder ? ?Aggravating factors: laying on it ?Relieving factors: meds, ice ? ?OBJECTIVE:  ?  ?PATIENT SURVEYS:  ?FOTO 25 ?FOTO: 50.2 at 7th VISIT ?FOTO: 10th visit 57 pts ?FOTO: 11th visit  ? ?4/4: AROM flexion 160 ?ABD 148 ?IR at 0 to belly, BHB reaching to waistline  ?ER at 0 55 ? ?MMT: formal MMT/HHD testing not indicated at this time given surgical healing timeline ?         ?TODAY'S TREATMENT:  ? ?4/18:  MANUAL:  joint most post and inf grade III ? ?Post capsule stretch 30s 3x ?Cane ext with ADD 3s 10x  ?IR cane stretch 10s 10x ?S/L ER 2lbs 3x10 ?Arnold press  2lbs 3x10 (VC for rotation and scap position) ?S/L ABD 2lbs 2x10 (focus on eccentric) ?OH triceps stretch 30s 3x ? ?UBE fwd and retro 2 min each ? ?4/13: ?MANUAL: STM L UT, levator,  rhomboids, infra ? ?4/11: ?MANUAL: IASTM to shoulder- Trigger point release to infraspinatus and upper trap ?Biceps curl from abduction- 1 lb weight; progressed to abd press to 90; progressed to [facility] press <>90 ?Bent over row 6lb- also with green band added to HEP ?Overhead ball on wall- circles & iso pres ? ?4/6 ?Pulley's flexion and ABD 2 min each ? ?Bent over row 3x10 5lb ?Standing scaption 2lb and ABD 2x10 ?GTB and ext rows 3x10 ?Wall ball circles 20x CW and CCW ?Wall push up 2x10 ?5lb supine protraction 2x10 ? S/L 2lbs 2x10 ? Supine flexion 2lb 2x10 ? Standing IR strap stretch 3s 15x ? ?4/4 ? ?Joint mobilization with ABD and inf; grade III inf and post mob ? ? ?PROM to protocol limits ? ?RTB rows 3x10 ?Standing scaption 1lb and ABD 2x10 ?Wall ball circles 20x CW and CCW ?Wall push up 2x10 ?Standing wall protraction 2x10 ?ER YTB 2x10  ? ? ? ?  ?PATIENT EDUCATION: ?Education details: relevant anatomy, exercise progression, DOMS expectations, muscle firing,  envelope of function, HEP ?  ?Person educated: Patient ?Education method: Explanation, Demonstration, Tactile cues, Verbal cues, and Handouts- text HEP ?Education comprehension: verbalized understanding, returned demonstration, verbal cues required, tactile cues required, and needs further education ?  ?  ?HOME EXERCISE PROGRAM: ?Access Code: [email or web address] ?URL: [email or web address] ? ?  ?ASSESSMENT: ?  ?  CLINICAL IMPRESSION: ?Pt found improved IR and reaching OH ROM following joint mob. Pt able to also progress OH strengthening and scapular stability without increasing pain. Pt with expected fatigue with increasing intensity. Pt progressing well and is largely strength limited but with increased baseline soreness with stretching. Plan to address joint mobility as needed with mobs. Progress with strength as tolerated. Pt advised to decrease strengthening to 3x/week vs 4x. ?  ?Objective impairments include decreased activity tolerance, decreased  knowledge of condition, decreased ROM, decreased strength, increased muscle spasms, impaired flexibility, impaired UE functional use, and pain. These impairments are limiting patient from cleaning, community activity, driving, meal prep, occupation, laundry, and shopping. Personal factors including Profession and Time since onset of injury/illness/exacerbation are also affecting patient's functional outcome. Patient will benefit from skilled PT to address above impairments and improve overall function. ?  ?REHAB POTENTIAL: Good ?  ?CLINICAL DECISION MAKING: Stable/uncomplicated ?  ?EVALUATION COMPLEXITY: Low ?  ?  ?GOALS: ?Goals reviewed with patient? Yes ?  ?SHORT TERM GOALS: ?  ?STG Name Target Date Goal status  ?1 Pt will demonstrate progression with post op protocol ?Baseline: will progress as appropriate 12/14/2021 achieved  ?2 Independent in donning/doffing sling ?Baseline: educated at eval 11/30/2021 achieved  ?         ?         ?         ?         ?         ?  ?LONG TERM GOALS:  ?  ?LTG Name Target Date Goal status  ?1 Pt will demo proper form with lifting 10lb to overhead shelf ?Baseline: 01/25/2022 ongoing  ?2 Pt will demo ROM within 10 deg of Rt UE without compensation ?Baseline:unable at eval 01/25/2022 ongoing  ?3 Pt will demo at least 80% strength in Left arm as Rt in dynamometry testing ?Baseline: unable to test at eval 02/08/2022 ongoing  ?4 Pt will begin increased strength training for return work regarding lifting requirements ?Baseline: will progress as appropriate 02/08/2022 ongoing  ?5 Pt will verbalize pain <=2/10 with ADLs ?Baseline: unable to use Lt UE for ADLs at eval 02/08/2022 ongoing  ?         ?         ?  ?PLAN: ?PT FREQUENCY: 1-2x/week ?  ?PT DURATION: 12 weeks ?  ?PLANNED INTERVENTIONS: Therapeutic exercises, Therapeutic activity, Neuro Muscular re-education, Patient/Family education, Joint mobilization, Aquatic Therapy, Dry Needling, Electrical stimulation, Cryotherapy, Moist heat, scar  mobilization, Taping, and Manual therapy ?  ?PLAN FOR NEXT SESSION: continue per protocol, light OH, full range challenges  ? ?Daleen Bo PT, DPT ?01/20/22 9:03 AM ? ? ? ? ?  ?

## 2022-01-20 NOTE — Telephone Encounter (Signed)
See call intake 

## 2022-01-21 ENCOUNTER — Encounter (HOSPITAL_COMMUNITY): Payer: Self-pay

## 2022-01-21 ENCOUNTER — Other Ambulatory Visit (HOSPITAL_COMMUNITY): Payer: BC Managed Care – PPO | Attending: Psychiatry | Admitting: Licensed Clinical Social Worker

## 2022-01-21 DIAGNOSIS — F332 Major depressive disorder, recurrent severe without psychotic features: Secondary | ICD-10-CM | POA: Insufficient documentation

## 2022-01-21 NOTE — Plan of Care (Signed)
?  Problem: Depression CCP Problem  1 Learn and Apply Coping Skills to Decrease Depression and Anxiety Symptoms   ?Goal: LTG: Ronan WILL SCORE LESS THAN 10 ON THE PATIENT HEALTH QUESTIONNAIRE (PHQ-9) ?Outcome: Not Applicable ?Goal: STG: Kaydon WILL ATTEND AT LEAST 80% OF SCHEDULED PHP SESSIONS ?Outcome: Not Applicable ?Goal: STG: De WILL ATTEND AT LEAST 80% OF SCHEDULED GROUP PSYCHOTHERAPY SESSIONS ?Outcome: Not Applicable ?Goal: STG: Margurette WILL COMPLETE AT LEAST 80% OF ASSIGNED HOMEWORK ?Outcome: Not Applicable ?Goal: STG: Reduce overall depression score by a minimum of 25% on the Patient Health Questionnaire (PHQ-9) or the Montgomery-Asberg Depression Rating Scale (MADRS) ?Outcome: Not Applicable ?Goal: STG: Shatora WILL IDENTIFY AT LEAST 3 COGNITIVE PATTERNS AND BELIEFS THAT SUPPORT DEPRESSION ?Outcome: Not Applicable ?  ?

## 2022-01-21 NOTE — Psych (Signed)
Virtual Visit via Video Note ? ?I connected with Rhonda [name] on 01/21/22 at 10:00 AM EDT by a video enabled telemedicine application and verified that I am speaking with the correct person using two identifiers. ? ?Location: ?Patient: pt's home ?Provider: clinical home office ?  ?I discussed the limitations of evaluation and management by telemedicine and the availability of in person appointments. The patient expressed understanding and agreed to proceed. ? ?  ?I discussed the assessment and treatment plan with the patient. The patient was provided an opportunity to ask questions and all were answered. The patient agreed with the plan and demonstrated an understanding of the instructions. ?  ?The patient was advised to call back or seek an in-person evaluation if the symptoms worsen or if the condition fails to improve as anticipated. ? ?I provided 60 minutes of non-face-to-face time during this encounter. ? ? ?Rhonda [name], LCSWA ? ? ?Comprehensive Clinical Assessment (CCA) Note ? ?01/21/2022 ?Rhonda [name] ?[id number] ? ?Chief Complaint:  ?Chief Complaint  ?Patient presents with  ? Anxiety  ? Depression  ? ?Visit Diagnosis: MDD with anxious distress  ? ? ?CCA Screening, Triage and Referral (STR) ? ?Patient Reported Information ?How did you hear about us? Other (Comment) ? ?Referral name: [facility] ? ?Referral phone number: No data recorded ? ?Whom do you see for routine medical problems? I don't have a doctor ? ?Practice/Facility Name: No data recorded ?Practice/Facility Phone Number: No data recorded ?Name of Contact: No data recorded ?Contact Number: No data recorded ?Contact Fax Number: No data recorded ?Prescriber Name: No data recorded ?Prescriber Address (if known): No data recorded ? ?What Is the Reason for Your Visit/Call Today? worsening depression and anxiety ? ?How Long Has This Been Causing You Problems? > than 6 months ? ?What Do You Feel Would Help You the Most Today? Treatment for Depression or  other mood problem ? ? ?Have You Recently Been in Any Inpatient Treatment (Hospital/Detox/Crisis Center/28-Day Program)? Yes ? ?Name/Location of Program/Hospital:[facility] ? ?How Long Were You There? 13 hours ? ?When Were You Discharged? 01/07/22 ? ? ?[facility]? Yes ? ?[facility]? No data recorded ? ?Have You Recently Had Any Thoughts About Hurting Yourself? Yes ? ?Are You Planning to Commit Suicide/Harm Yourself At This time? No ? ? ?Have you Recently Had Thoughts About Hurting Someone Rhonda Ohslse? No ? ?Explanation: No data recorded ? ?Have You Used Any Alcohol or Drugs in the Past 24 Hours? Yes ? ?How Long Ago Did You Use Drugs or Alcohol? No data recorded ?What Did You Use and How Much? Pt used about a blunt of marijuana ? ? ?Do You Currently Have a Therapist/Psychiatrist? Yes ? ?Name of Therapist/Psychiatrist: Hilbert CorriganMatthew Schooler[facility]- states she has not seen him in a while ? ? ?Have You Been Recently Discharged From Any Office Practice or Programs? No ? ?Explanation of Discharge From Practice/Program: No data recorded ? ?  ?CCA Screening Triage Referral Assessment ?Type of Contact: Tele-Assessment ? ?Is this Initial or Reassessment? Initial Assessment ? ?Date Telepsych consult ordered in [facility]:  No data recorded ?Time Telepsych consult ordered in [facility]:  No data recorded ? ?Patient Reported Information Reviewed? No data recorded ?Patient Left Without Being Seen? No data recorded ?Reason for Not Completing Assessment: No data recorded ? ?Collateral Involvement: chart review ? ? ?Does Patient Have a Automotive engineerCourt Appointed Legal Guardian? no ?Name and Contact of Legal Guardian: No  data recorded ?If Minor and Not Living with Parent(s), Who has Custody? No data recorded ?Is CPS involved or ever been involved? Never ? ?Is APS involved or ever been involved? Never ? ? ?Patient Determined To Be At Risk for Harm To Self or Others Based on Review of Patient Reported  Information or Presenting Complaint? No ? ?Method: No data recorded ?Availability of Means: No data recorded ?Intent: No data recorded ?Notification Required: No data recorded ?Additional Information for Danger to Others Potential: No data recorded ?Additional Comments for Danger to Others Potential: No data recorded ?Are There Guns or Other Weapons in Your Home? No data recorded ?Types of Guns/Weapons: No data recorded ?Are These Weapons Safely Secured?                            No data recorded ?Who Could Verify You Are Able To Have These Secured: No data recorded ?Do You Have any Outstanding Charges, Pending Court Dates, Parole/Probation? No data recorded ?Contacted To Inform of Risk of Harm To Self or Others: No data recorded ? ?Location of Assessment: Other (comment) ? ? ?Does Patient Present under Involuntary Commitment? No ? ?IVC Papers Initial File Date: No data recorded ? ?[place] of Residence: [place] ? ? ?Patient Currently Receiving the Following Services: Individual Therapy ? ? ?Determination of Need: Urgent (48 hours) ? ? ?Options For Referral: Partial Hospitalization ? ? ? ? ?CCA Biopsychosocial ?Intake/Chief Complaint:  Rhonda [name] is a 31yo female referred to [facility] for worsening depression and anxiety symptoms. She cites her stressors as significant losses (two brothers, an aunt, a high school friend) over the past two years, her mother's cancer diagnosis and her mother living in another state, and the shoulder injury she suffered that has made her unable to work. She is currently engaging in physical therapy twice a week. She reports she was referred to [facility] from the [facility] and was advised to agree to admission, but she states it felt "like a prison" and elected to explore PHP/IOP for tx. She reports she has a therapist that she has not seen for the last two scheduled appointments. Denies other tx history, hospitalizations, physical health conditions outside of her injury, suicide attempts, substance  abuse history, AVH and HI. She endorses regular marijuana use and occasional alcohol use. She cites her mother as her primary support and states she lives alone. She endorses significant weight loss in the past four months (60 lb) due to decreased appetite. She states she has not worked since November due to her injury and that it is a worker's comp case. Cln discussed tx plan and pt provided verbal consent for cln to electronically sign on her behalf. ? ?Current Symptoms/Problems: difficulty sleeping d/t rumination, feeling keyed up/ restless, irritability, difficulty concentrating, fatigue, poor sleep, guilt, decreased appetite, difficulty with concentration, passive SI, low mood, panic attacks since her mother was diagnosed with cancer-she describes panic attack as palpitations, racing heart, SOB, sense of impending doom, shaking and diaphoresis. ? ? ?Patient Reported Schizophrenia/Schizoaffective Diagnosis in Past: No ? ? ?Strengths: motivation for tx ? ?Preferences: open to PHP and IOP ? ?Abilities: able to engage in tx ? ? ?Type of Services Patient Feels are Needed: improvement in functioning and reduction in symptoms ? ? ?Initial Clinical Notes/Concerns: No data recorded ? ?Mental Health Symptoms ?Depression:   ?Sleep (too much or little); Tearfulness; Hopelessness; Worthlessness; Fatigue; Irritability; Increase/decrease in appetite; Difficulty Concentrating ?  ?Duration of Depressive symptoms:  ?  Greater than two weeks ?  ?Mania:   ?Racing thoughts ?  ?Anxiety:    ?Worrying; Tension; Fatigue; Difficulty concentrating; Irritability; Restlessness ?  ?Psychosis:   ?None ?  ?Duration of Psychotic symptoms: No data recorded  ?Trauma:   ?Difficulty staying/falling asleep; Detachment from others; Emotional numbing ?  ?Obsessions:   ?None ?  ?Compulsions:   ?None ?  ?Inattention:   ?None ?  ?Hyperactivity/Impulsivity:   ?Feeling of restlessness ?  ?Oppositional/Defiant Behaviors:   ?None ?  ?Emotional Irregularity:    ?Intense/inappropriate anger ?  ?Other Mood/Personality Symptoms:  No data recorded  ? ?Mental Status Exam ?Appearance and self-care  ?Stature:   ?Average ?  ?Weight:   ?Average weight ?  ?Clothing:   ?Cas

## 2022-01-25 ENCOUNTER — Ambulatory Visit (HOSPITAL_BASED_OUTPATIENT_CLINIC_OR_DEPARTMENT_OTHER): Payer: BC Managed Care – PPO | Admitting: Physical Therapy

## 2022-01-25 ENCOUNTER — Telehealth (HOSPITAL_COMMUNITY): Payer: Self-pay | Admitting: Licensed Clinical Social Worker

## 2022-01-25 ENCOUNTER — Encounter (HOSPITAL_BASED_OUTPATIENT_CLINIC_OR_DEPARTMENT_OTHER): Payer: Self-pay | Admitting: Physical Therapy

## 2022-01-25 DIAGNOSIS — M25512 Pain in left shoulder: Secondary | ICD-10-CM

## 2022-01-25 DIAGNOSIS — M6281 Muscle weakness (generalized): Secondary | ICD-10-CM

## 2022-01-25 NOTE — Therapy (Signed)
?OUTPATIENT PHYSICAL THERAPY TREATMENT NOTE ? ? ?Patient Name: Rhonda [name] ?MRN: [id number] ?DOB:[birth year], 31 y.o., female ?Today's Date: 01/25/2022 ? ?PCP: Patient, No Pcp Per (Inactive) ?REFERRING PROVIDER: [name], MD ? ? PT End of Session - 01/25/22 1018   ? ? Visit Number 16   ? Number of Visits 25   ? Date for PT Re-Evaluation 02/11/22   ? Authorization Type BCBS   ? PT Start Time 1016   ? PT Stop Time 1054   ? PT Time Calculation (min) 38 min   ? Activity Tolerance Patient tolerated treatment well   ? Behavior During Therapy [facility] for tasks assessed/performed   ? ?  ?  ? ?  ? ? ? ? ? ? ? ? ? ? ?Past Medical History:  ?Diagnosis Date  ? Medical history non-contributory   ? Shoulder injury   ? ?Past Surgical History:  ?Procedure Laterality Date  ? SHOULDER ARTHROSCOPY WITH LABRAL REPAIR Left 11/11/2021  ? Procedure: Left SHOULDER ARTHROSCOPY WITH LABRAL REPAIR;  Surgeon: [name], MD;  Location: [place];  Service: Orthopedics;  Laterality: Left;  ? TONSILLECTOMY    ? wisdom teeth    ? ?Patient Active Problem List  ? Diagnosis Date Noted  ? Major depressive disorder, recurrent episode, severe with anxious distress ([place]) 01/21/2022  ? Tear of left glenoid labrum   ? Hyperemesis gravidarum 01/08/2020  ? Leukocytosis 01/08/2020  ? Abnormal uterine bleeding 10/11/2018  ? ? ?REFERRING DIAG: S/p Lt labral repair ? ?THERAPY DIAG:  ?Acute pain of left shoulder ? ?Muscle weakness (generalized) ? ?PERTINENT HISTORY: none ? ?PRECAUTIONS: shoulder, DOS 11/11/21 ? ?SUBJECTIVE:  a little sore after missing last Thursday. May have to leave to take care of Mom.  ? ?PAIN:  ?Are you having pain? No pain right now, soreness ?NPRS scale: 0/10 ?Pain location: Lt shoulder ? ?Aggravating factors: laying on it ?Relieving factors: meds, ice ? ?OBJECTIVE:  ?  ?PATIENT SURVEYS:  ?FOTO 25 ?FOTO: 50.2 at 7th VISIT ?FOTO: 10th visit 57 pts ?FOTO: 11th visit  ? ?4/4: AROM flexion 160 ?ABD 148 ?IR at 0 to belly, BHB  reaching to waistline  ?ER at 0 55 ? ?7 ?         ?TODAY'S TREATMENT:  ?4/25: ?MANUAL: scapular mobility, STM to infraspinatus, lats, subscap ?Edu on theracane ?Standing in mirror with 1lb: flexion, abd, scaption to 90- small circles; bent over shoulder extension with small circles, abd to 90 + GHJ ER ? Cues for core engagement ? ? ?4/18:  MANUAL:  joint most post and inf grade III ? ?Post capsule stretch 30s 3x ?Cane ext with ADD 3s 10x  ?IR cane stretch 10s 10x ?S/L ER 2lbs 3x10 ?Arnold press  2lbs 3x10 (VC for rotation and scap position) ?S/L ABD 2lbs 2x10 (focus on eccentric) ?OH triceps stretch 30s 3x ? ?UBE fwd and retro 2 min each ? ?4/13: ?MANUAL: STM L UT, levator, rhomboids, infra ? ? ? ?  ?PATIENT EDUCATION: ?Education details: relevant anatomy, exercise progression, DOMS expectations, muscle firing,  envelope of function, HEP ?  ?Person educated: Patient ?Education method: Explanation, Demonstration, Tactile cues, Verbal cues, and Handouts- text HEP ?Education comprehension: verbalized understanding, returned demonstration, verbal cues required, tactile cues required, and needs further education ?  ?  ?HOME EXERCISE PROGRAM: ?Access Code: [email or web address] ?URL: [email or web address] ? ?  ?ASSESSMENT: ?  ?CLINICAL IMPRESSION: ?Worked on endurance with focus on deltoid and RC for stability. Able to correct form with cuing and  felt fatigue. Trigger points in infraspinatus and lats created concordant pain and were reduced with manual therapy.  ?  ?Objective impairments include decreased activity tolerance, decreased knowledge of condition, decreased ROM, decreased strength, increased muscle spasms, impaired flexibility, impaired UE functional use, and pain. These impairments are limiting patient from cleaning, community activity, driving, meal prep, occupation, laundry, and shopping. Personal factors including Profession and Time since onset of injury/illness/exacerbation are also affecting patient's  functional outcome. Patient will benefit from skilled PT to address above impairments and improve overall function. ?  ?REHAB POTENTIAL: Good ?  ?CLINICAL DECISION MAKING: Stable/uncomplicated ?  ?EVALUATION COMPLEXITY: Low ?  ?  ?GOALS: ?Goals reviewed with patient? Yes ?  ?SHORT TERM GOALS: ?  ?STG Name Target Date Goal status  ?1 Pt will demonstrate progression with post op protocol ?Baseline: will progress as appropriate 12/14/2021 achieved  ?2 Independent in donning/doffing sling ?Baseline: educated at eval 11/30/2021 achieved  ?         ?         ?         ?         ?         ?  ?LONG TERM GOALS:  ?  ?LTG Name Target Date Goal status  ?1 Pt will demo proper form with lifting 10lb to overhead shelf ?Baseline: 01/25/2022 achieved  ?2 Pt will demo ROM within 10 deg of Rt UE without compensation ?Baseline:unable at eval 01/25/2022 achieved  ?3 Pt will demo at least 80% strength in Left arm as Rt in dynamometry testing ?Baseline: unable to test at eval 02/08/2022 ongoing  ?4 Pt will begin increased strength training for return work regarding lifting requirements ?Baseline: will progress as appropriate 02/08/2022 ongoing  ?5 Pt will verbalize pain <=2/10 with ADLs ?Baseline: unable to use Lt UE for ADLs at eval 02/08/2022 ongoing  ?         ?         ?  ?PLAN: ?PT FREQUENCY: 1-2x/week ?  ?PT DURATION: 12 weeks ?  ?PLANNED INTERVENTIONS: Therapeutic exercises, Therapeutic activity, Neuro Muscular re-education, Patient/Family education, Joint mobilization, Aquatic Therapy, Dry Needling, Electrical stimulation, Cryotherapy, Moist heat, scar mobilization, Taping, and Manual therapy ?  ?PLAN FOR NEXT SESSION: continue per protocol, light OH, full range challenges  ? ?Wilda Wetherell C. Benard Minturn PT, DPT ?01/25/22 10:57 AM ? ? ? ? ? ?  ?

## 2022-01-26 ENCOUNTER — Ambulatory Visit (HOSPITAL_COMMUNITY): Payer: BC Managed Care – PPO

## 2022-01-26 ENCOUNTER — Other Ambulatory Visit (HOSPITAL_COMMUNITY): Payer: BC Managed Care – PPO

## 2022-01-27 ENCOUNTER — Ambulatory Visit (HOSPITAL_COMMUNITY): Payer: BC Managed Care – PPO

## 2022-01-27 ENCOUNTER — Ambulatory Visit (HOSPITAL_BASED_OUTPATIENT_CLINIC_OR_DEPARTMENT_OTHER): Payer: BC Managed Care – PPO | Admitting: Physical Therapy

## 2022-01-27 ENCOUNTER — Encounter (HOSPITAL_BASED_OUTPATIENT_CLINIC_OR_DEPARTMENT_OTHER): Payer: Self-pay | Admitting: Physical Therapy

## 2022-01-27 DIAGNOSIS — M25512 Pain in left shoulder: Secondary | ICD-10-CM

## 2022-01-27 DIAGNOSIS — M6281 Muscle weakness (generalized): Secondary | ICD-10-CM

## 2022-01-27 NOTE — Therapy (Signed)
?OUTPATIENT PHYSICAL THERAPY TREATMENT NOTE ? ? ?Patient Name: Rhonda [name] ?MRN: [id number] ?DOB:[birth year], 31 y.o., female ?Today's Date: 01/27/2022 ? ?PCP: Patient, No Pcp Per (Inactive) ?REFERRING PROVIDER: [name], MD ? ? PT End of Session - 01/27/22 1402   ? ? Visit Number 17   ? Number of Visits 25   ? Date for PT Re-Evaluation 02/11/22   ? Authorization Type BCBS   ? PT Start Time 1400   ? PT Stop Time 1430   ? PT Time Calculation (min) 30 min   ? Activity Tolerance Patient tolerated treatment well   ? Behavior During Therapy [facility] for tasks assessed/performed   ? ?  ?  ? ?  ? ? ? ? ? ? ? ? ? ? ?Past Medical History:  ?Diagnosis Date  ? Medical history non-contributory   ? Shoulder injury   ? ?Past Surgical History:  ?Procedure Laterality Date  ? SHOULDER ARTHROSCOPY WITH LABRAL REPAIR Left 11/11/2021  ? Procedure: Left SHOULDER ARTHROSCOPY WITH LABRAL REPAIR;  Surgeon: [name], MD;  Location: [place];  Service: Orthopedics;  Laterality: Left;  ? TONSILLECTOMY    ? wisdom teeth    ? ?Patient Active Problem List  ? Diagnosis Date Noted  ? Major depressive disorder, recurrent episode, severe with anxious distress ([place]) 01/21/2022  ? Tear of left glenoid labrum   ? Hyperemesis gravidarum 01/08/2020  ? Leukocytosis 01/08/2020  ? Abnormal uterine bleeding 10/11/2018  ? ? ?REFERRING DIAG: S/p Lt labral repair ? ?THERAPY DIAG:  ?Acute pain of left shoulder ? ?Muscle weakness (generalized) ? ?PERTINENT HISTORY: none ? ?PRECAUTIONS: shoulder, DOS 11/11/21 ? ?SUBJECTIVE:  a little sore after missing last Thursday. May have to leave to take care of Mom.  ? ?PAIN:  ?Are you having pain? No pain right now, soreness ?NPRS scale: 0/10 ?Pain location: Lt shoulder ? ?Aggravating factors: laying on it ?Relieving factors: meds, ice ? ?OBJECTIVE:  ?  ?PATIENT SURVEYS:  ?FOTO 25 ?FOTO: 50.2 at 7th VISIT ?FOTO: 10th visit 57 pts ?FOTO: 11th visit  ? ?4/4: AROM flexion 160 ?ABD 148 ?IR at 0 to belly, BHB  reaching to waistline  ?ER at 0 55 ? ? ?         ?TODAY'S TREATMENT:  ?4/27: ?MANUAL: TPDN with skilled palpation and monitoring: upper traps, levator scap, infraspinatus ?External rotation yellow tband ?Shoulder horiz abd & flexion yellow tband at wall ?Qped lawn mower yellow tband ? ? ?4/25: ?MANUAL: scapular mobility, STM to infraspinatus, lats, subscap ?Edu on theracane ?Standing in mirror with 1lb: flexion, abd, scaption to 90- small circles; bent over shoulder extension with small circles, abd to 90 + GHJ ER ? Cues for core engagement ? ? ?4/18:  MANUAL:  joint most post and inf grade III ? ?Post capsule stretch 30s 3x ?Cane ext with ADD 3s 10x  ?IR cane stretch 10s 10x ?S/L ER 2lbs 3x10 ?Arnold press  2lbs 3x10 (VC for rotation and scap position) ?S/L ABD 2lbs 2x10 (focus on eccentric) ?OH triceps stretch 30s 3x ? ?UBE fwd and retro 2 min each ? ?4/13: ?MANUAL: STM L UT, levator, rhomboids, infra ? ? ? ?  ?PATIENT EDUCATION: ?Education details: relevant anatomy, exercise progression, DOMS expectations, muscle firing,  envelope of function, HEP ?  ?Person educated: Patient ?Education method: Explanation, Demonstration, Tactile cues, Verbal cues, and Handouts- text HEP ?Education comprehension: verbalized understanding, returned demonstration, verbal cues required, tactile cues required, and needs further education ?  ?  ?HOME  EXERCISE PROGRAM: ?Access Code: [email or web address] ?URL: [email or web address] ? ?  ?ASSESSMENT: ?  ?CLINICAL IMPRESSION: ?Released tightness in infraspinatus today and exercises focused on scapular control and stabilization. Confusion with appointments today and was cancelled from outside clinic but was able to see for shortened appt.  ?  ?Objective impairments include decreased activity tolerance, decreased knowledge of condition, decreased ROM, decreased strength, increased muscle spasms, impaired flexibility, impaired UE functional use, and pain. These impairments are limiting  patient from cleaning, community activity, driving, meal prep, occupation, laundry, and shopping. Personal factors including Profession and Time since onset of injury/illness/exacerbation are also affecting patient's functional outcome. Patient will benefit from skilled PT to address above impairments and improve overall function. ?  ?REHAB POTENTIAL: Good ?  ?CLINICAL DECISION MAKING: Stable/uncomplicated ?  ?EVALUATION COMPLEXITY: Low ?  ?  ?GOALS: ?Goals reviewed with patient? Yes ?  ?SHORT TERM GOALS: ?  ?STG Name Target Date Goal status  ?1 Pt will demonstrate progression with post op protocol ?Baseline: will progress as appropriate 12/14/2021 achieved  ?2 Independent in donning/doffing sling ?Baseline: educated at eval 11/30/2021 achieved  ?         ?         ?         ?         ?         ?  ?LONG TERM GOALS:  ?  ?LTG Name Target Date Goal status  ?1 Pt will demo proper form with lifting 10lb to overhead shelf ?Baseline: 01/25/2022 achieved  ?2 Pt will demo ROM within 10 deg of Rt UE without compensation ?Baseline:unable at eval 01/25/2022 achieved  ?3 Pt will demo at least 80% strength in Left arm as Rt in dynamometry testing ?Baseline: unable to test at eval 02/08/2022 ongoing  ?4 Pt will begin increased strength training for return work regarding lifting requirements ?Baseline: will progress as appropriate 02/08/2022 ongoing  ?5 Pt will verbalize pain <=2/10 with ADLs ?Baseline: unable to use Lt UE for ADLs at eval 02/08/2022 ongoing  ?         ?         ?  ?PLAN: ?PT FREQUENCY: 1-2x/week ?  ?PT DURATION: 12 weeks ?  ?PLANNED INTERVENTIONS: Therapeutic exercises, Therapeutic activity, Neuro Muscular re-education, Patient/Family education, Joint mobilization, Aquatic Therapy, Dry Needling, Electrical stimulation, Cryotherapy, Moist heat, scar mobilization, Taping, and Manual therapy ?  ?PLAN FOR NEXT SESSION: re-eval to extend POC 8 weeks ? ?Chrysa Rampy C. Sneijder Bernards PT, DPT ?01/27/22 3:14 PM ? ? ? ? ? ?  ?

## 2022-01-28 ENCOUNTER — Other Ambulatory Visit (HOSPITAL_COMMUNITY): Payer: BC Managed Care – PPO

## 2022-01-28 ENCOUNTER — Ambulatory Visit (HOSPITAL_COMMUNITY): Payer: BC Managed Care – PPO

## 2022-01-31 ENCOUNTER — Ambulatory Visit (HOSPITAL_COMMUNITY): Payer: BC Managed Care – PPO

## 2022-01-31 ENCOUNTER — Telehealth (HOSPITAL_COMMUNITY): Payer: Self-pay | Admitting: Emergency Medicine

## 2022-01-31 ENCOUNTER — Other Ambulatory Visit (HOSPITAL_COMMUNITY): Payer: BC Managed Care – PPO

## 2022-01-31 NOTE — BH Assessment (Signed)
Care Management - [facility] Follow Up Discharges  ° °Writer attempted to make contact with patient today and was unsuccessful.  Writer left a HIPPA compliant voice message.  ° °Per chart review, patient was referred to the PHP (Partial Hospitalization Program).   °

## 2022-02-01 ENCOUNTER — Ambulatory Visit (HOSPITAL_COMMUNITY): Payer: BC Managed Care – PPO

## 2022-02-01 ENCOUNTER — Other Ambulatory Visit (HOSPITAL_COMMUNITY): Payer: BC Managed Care – PPO

## 2022-02-02 ENCOUNTER — Ambulatory Visit (INDEPENDENT_AMBULATORY_CARE_PROVIDER_SITE_OTHER): Payer: BC Managed Care – PPO | Admitting: Orthopaedic Surgery

## 2022-02-02 ENCOUNTER — Ambulatory Visit (HOSPITAL_COMMUNITY): Payer: BC Managed Care – PPO

## 2022-02-02 DIAGNOSIS — S43432A Superior glenoid labrum lesion of left shoulder, initial encounter: Secondary | ICD-10-CM

## 2022-02-02 NOTE — Progress Notes (Signed)
? ?                            ? ? ?Post Operative Evaluation ?  ? ?Procedure/Date of Surgery: 11/11/21 -left shoulder labral repair ? ?Interval History:  ? ?Presents today for follow-up of her left shoulder.  Overall she is doing extremely well.  She has been working on progressing through her rehab protocol.  She has remained out of work as she strengthens.  She is hopeful to get back to the gym. ? ? ?PMH/PSH/Family History/Social History/Meds/Allergies:   ? ?Past Medical History:  ?Diagnosis Date  ? Medical history non-contributory   ? Shoulder injury   ? ?Past Surgical History:  ?Procedure Laterality Date  ? SHOULDER ARTHROSCOPY WITH LABRAL REPAIR Left 11/11/2021  ? Procedure: Left SHOULDER ARTHROSCOPY WITH LABRAL REPAIR;  Surgeon: [name], MD;  Location: [place];  Service: Orthopedics;  Laterality: Left;  ? TONSILLECTOMY    ? wisdom teeth    ? ?Social History  ? ?Socioeconomic History  ? Marital status: Single  ?  Spouse name: Not on file  ? Number of children: Not on file  ? Years of education: Not on file  ? Highest education level: Not on file  ?Occupational History  ? Not on file  ?Tobacco Use  ? Smoking status: Former  ?  Types: Cigarettes  ?  Quit date: 09/30/2012  ?  Years since quitting: 9.3  ? Smokeless tobacco: Never  ?Vaping Use  ? Vaping Use: Never used  ?Substance and Sexual Activity  ? Alcohol use: Not Currently  ? Drug use: Not Currently  ? Sexual activity: Yes  ?  Birth control/protection: None  ?Other Topics Concern  ? Not on file  ?Social History Narrative  ? Not on file  ? ?Social Determinants of Health  ? ?Financial Resource Strain: Not on file  ?Food Insecurity: Not on file  ?Transportation Needs: Not on file  ?Physical Activity: Not on file  ?Stress: Not on file  ?Social Connections: Not on file  ? ?Family History  ?Problem Relation Age of Onset  ? Hypertension Other   ? ?No Known Allergies ?No current outpatient medications on file.  ? ?No current  facility-administered medications for this visit.  ? ?No results found. ? ?Review of Systems:   ?A ROS was performed including pertinent positives and negatives as documented in the HPI. ? ? ?Musculoskeletal Exam:   ? ? ?Incisions are well-healed.  Left forward active elevation is to 170 degrees equal to contralateral side.  External rotation at the side is to 60 degrees equal to contralateral side.  Internal rotation is to L1 sensation is intact left upper extremity ? ?Imaging:   ? ?None ? ?I personally reviewed and interpreted the radiographs. ? ? ?Assessment:   ?31 year old female who is 12 weeks status post left shoulder labral repair overall she is doing extremely well.  At this time I do believe she is ready for strengthening.  I have advised her on forward elevation rows as well as external rotation 3 sets of 10 with increasing weight.  I do believe she is ready for the 5 pound weights ? ?-Return to clinic in 6 weeks ? ? ? ? ? ?I personally saw and evaluated the patient, and participated in the management and treatment plan. ? ?[name], MD ?Attending Physician, Orthopedic Surgery ? ?This document was dictated using Systems analyst. A reasonable attempt at  proof reading has been made to minimize errors. ?

## 2022-02-03 ENCOUNTER — Other Ambulatory Visit (HOSPITAL_COMMUNITY): Payer: BC Managed Care – PPO

## 2022-02-03 ENCOUNTER — Ambulatory Visit (HOSPITAL_COMMUNITY): Payer: BC Managed Care – PPO

## 2022-02-03 ENCOUNTER — Telehealth (HOSPITAL_COMMUNITY): Payer: Self-pay | Admitting: Emergency Medicine

## 2022-02-03 NOTE — BH Assessment (Signed)
Care Management - [facility] Follow Up Discharges  ? ?Writer made contact with the patient.  Patient reports that she has spoken to the [facility] coordinator but she needs to speak to the coordinator again in order to find out the start date of the program.  ?

## 2022-02-04 ENCOUNTER — Ambulatory Visit (HOSPITAL_COMMUNITY): Payer: BC Managed Care – PPO

## 2022-02-04 ENCOUNTER — Other Ambulatory Visit (HOSPITAL_COMMUNITY): Payer: BC Managed Care – PPO

## 2022-02-07 ENCOUNTER — Other Ambulatory Visit (HOSPITAL_COMMUNITY): Payer: BC Managed Care – PPO

## 2022-02-07 ENCOUNTER — Ambulatory Visit (HOSPITAL_COMMUNITY): Payer: BC Managed Care – PPO

## 2022-02-10 ENCOUNTER — Telehealth (HOSPITAL_COMMUNITY): Payer: Self-pay | Admitting: Licensed Clinical Social Worker

## 2022-02-16 ENCOUNTER — Ambulatory Visit (HOSPITAL_BASED_OUTPATIENT_CLINIC_OR_DEPARTMENT_OTHER): Payer: Self-pay | Attending: Orthopaedic Surgery | Admitting: Physical Therapy

## 2022-02-16 DIAGNOSIS — M6281 Muscle weakness (generalized): Secondary | ICD-10-CM | POA: Insufficient documentation

## 2022-02-16 DIAGNOSIS — M25512 Pain in left shoulder: Secondary | ICD-10-CM | POA: Insufficient documentation

## 2022-02-18 ENCOUNTER — Ambulatory Visit (HOSPITAL_BASED_OUTPATIENT_CLINIC_OR_DEPARTMENT_OTHER): Payer: Self-pay | Admitting: Physical Therapy

## 2022-02-21 NOTE — Telephone Encounter (Signed)
See call intake 

## 2022-02-22 ENCOUNTER — Telehealth (HOSPITAL_COMMUNITY): Payer: Self-pay | Admitting: Licensed Clinical Social Worker

## 2022-02-22 ENCOUNTER — Ambulatory Visit (HOSPITAL_BASED_OUTPATIENT_CLINIC_OR_DEPARTMENT_OTHER): Payer: Self-pay | Admitting: Physical Therapy

## 2022-02-22 ENCOUNTER — Encounter (HOSPITAL_BASED_OUTPATIENT_CLINIC_OR_DEPARTMENT_OTHER): Payer: Self-pay | Admitting: Physical Therapy

## 2022-02-22 DIAGNOSIS — M25512 Pain in left shoulder: Secondary | ICD-10-CM | POA: Diagnosis present

## 2022-02-22 DIAGNOSIS — M6281 Muscle weakness (generalized): Secondary | ICD-10-CM

## 2022-02-22 NOTE — Therapy (Signed)
OUTPATIENT PHYSICAL THERAPY TREATMENT NOTE   Patient Name: Rhonda [name] MRN: [id number] DOB:1991-09-27, 31 y.o., female Today's Date: 02/22/2022  PCP: Patient, No Pcp Per (Inactive) REFERRING PROVIDER: [name], MD   PT End of Session - 02/22/22 1211     Visit Number 18    Number of Visits 35    Date for PT Re-Evaluation 05/23/22    Authorization Type BCBS    PT Start Time 1145    PT Stop Time 1225    PT Time Calculation (min) 40 min    Activity Tolerance Patient tolerated treatment well    Behavior During Therapy WFL for tasks assessed/performed                      Past Medical History:  Diagnosis Date   Medical history non-contributory    Shoulder injury    Past Surgical History:  Procedure Laterality Date   SHOULDER ARTHROSCOPY WITH LABRAL REPAIR Left 11/11/2021   Procedure: Left SHOULDER ARTHROSCOPY WITH LABRAL REPAIR;  Surgeon: [name], MD;  Location: [place];  Service: Orthopedics;  Laterality: Left;   TONSILLECTOMY     wisdom teeth     Patient Active Problem List   Diagnosis Date Noted   Major depressive disorder, recurrent episode, severe with anxious distress ([place]) 01/21/2022   Tear of left glenoid labrum    Hyperemesis gravidarum 01/08/2020   Leukocytosis 01/08/2020   Abnormal uterine bleeding 10/11/2018    REFERRING DIAG: S/p Lt labral repair  THERAPY DIAG:  Acute pain of left shoulder  Muscle weakness (generalized)  PERTINENT HISTORY: none  PRECAUTIONS: shoulder, DOS 11/11/21  SUBJECTIVE:  Pt has soreness in the shoulder in the shoulder from doing more exercise.   PAIN:  Are you having pain? No pain right now, soreness NPRS scale: 0/10 Pain location: Lt shoulder  Aggravating factors: laying on it Relieving factors: meds, ice  OBJECTIVE:    PATIENT SURVEYS:  FOTO 25 FOTO: 50.2 at 7th visit FOTO: 10th visit 57 pts FOTO: 18th visit  67 pts  AROM: L flexion 167 ABD: 165 IR: T6 reach ER:  T5 Ext: 60    HHD MMT: measured in lbs  L flexion: 23.9  ABD: 29.6 IR: 16.8 ER: 16.4             TODAY'S TREATMENT:  5/23  MANUAL:  joint most post and inf grade III  Bent over DB row 7lbs 3x10 Arnold press  5lbs 3x10 (VC for rotation and scap position) Bar height push up 2x10 Cable row 25lbs 3x10 IR/ER 90/90 RTB 2x10 each   4/27: MANUAL: TPDN with skilled palpation and monitoring: upper traps, levator scap, infraspinatus External rotation yellow tband Shoulder horiz abd & flexion yellow tband at wall Qped lawn mower yellow tband   4/25: MANUAL: scapular mobility, STM to infraspinatus, lats, subscap Edu on theracane Standing in mirror with 1lb: flexion, abd, scaption to 90- small circles; bent over shoulder extension with small circles, abd to 90 + GHJ ER  Cues for core engagement   4/18:  MANUAL:  joint most post and inf grade III  Post capsule stretch 30s 3x Cane ext with ADD 3s 10x  IR cane stretch 10s 10x S/L ER 2lbs 3x10 Arnold press  2lbs 3x10 (VC for rotation and scap position) S/L ABD 2lbs 2x10 (focus on eccentric) OH triceps stretch 30s 3x  UBE fwd and retro 2 min each  4/13: MANUAL: STM L UT, levator, rhomboids, infra  PATIENT EDUCATION: Education details: exam findings, exercise progression, DOMS expectations, muscle firing,  envelope of function, HEP   Person educated: Patient Education method: Explanation, Demonstration, Tactile cues, Verbal cues, and Handouts- text HEP Education comprehension: verbalized understanding, returned demonstration, verbal cues required, tactile cues required, and needs further education     HOME EXERCISE PROGRAM: Access Code: [email or web address] URL: [email or web address]    ASSESSMENT:   CLINICAL IMPRESSION: Pt is 14 wks post labral repair. Pt with nearly full ROM at today's session and HHD demonstrates RC weakness, as expected at this point. Pt with good progression to strengthening phase of  rehab and, per MD note, able to progress to more aggressive strengthening at/above shoulder level at this time. Pt likely able to decrease frequency of visits if able to continue with self gym strengthening at 2-3x/week. Pt has made good progress and is able to move towards Pt would benefit from continued skilled therapy in order to reach goals and maximize functional L UE strength and ROM for full return to PLOF.    Objective impairments include decreased activity tolerance, decreased knowledge of condition, decreased ROM, decreased strength, increased muscle spasms, impaired flexibility, impaired UE functional use, and pain. These impairments are limiting patient from cleaning, community activity, driving, meal prep, occupation, laundry, and shopping. Personal factors including Profession and Time since onset of injury/illness/exacerbation are also affecting patient's functional outcome. Patient will benefit from skilled PT to address above impairments and improve overall function.   REHAB POTENTIAL: Good   CLINICAL DECISION MAKING: Stable/uncomplicated   EVALUATION COMPLEXITY: Low     GOALS: Goals reviewed with patient? Yes   SHORT TERM GOALS:   STG Name Target Date Goal status  1 Pt will demonstrate progression with post op protocol Baseline: will progress as appropriate 12/14/2021 achieved  2 Independent in donning/doffing sling Baseline: educated at eval 11/30/2021 achieved                                                 LONG TERM GOALS:    LTG Name Target Date Goal status  1 Pt will demo proper form with lifting 10lb to overhead shelf Baseline: 01/25/2022 achieved  2 Pt will demo ROM within 10 deg of Rt UE without compensation Baseline:unable at eval 01/25/2022 achieved  3 Pt will demo at least 80% strength in Left arm as Rt in dynamometry testing Baseline: unable to test at eval 05/23/22 ongoing  4 Pt will begin increased strength training for return work regarding lifting  requirements Baseline: will progress as appropriate 05/23/22 ongoing  5 Pt will verbalize pain <=2/10 with ADLs Baseline: unable to use Lt UE for ADLs at eval 05/23/22 MET   6  Pt will be able to reach [facility] and carry/hold >25 lbs in order to demonstrate functional improvement in L UE strength for return to PLOF and exercise.                  PLAN: PT FREQUENCY: 1-2x/week   PT DURATION: 12 weeks (likely DC in 8 wks)   PLANNED INTERVENTIONS: Therapeutic exercises, Therapeutic activity, Neuro Muscular re-education, Patient/Family education, Joint mobilization, Aquatic Therapy, Dry Needling, Electrical stimulation, Cryotherapy, Moist heat, scar mobilization, Taping, and Manual therapy   PLAN FOR NEXT SESSION: progress shoulder strengthening and OH strength  Daleen Bo PT, DPT 02/22/22 12:33 PM

## 2022-02-24 ENCOUNTER — Ambulatory Visit (HOSPITAL_BASED_OUTPATIENT_CLINIC_OR_DEPARTMENT_OTHER): Payer: Self-pay | Admitting: Physical Therapy

## 2022-02-24 ENCOUNTER — Encounter (HOSPITAL_BASED_OUTPATIENT_CLINIC_OR_DEPARTMENT_OTHER): Payer: Self-pay | Admitting: Physical Therapy

## 2022-02-24 DIAGNOSIS — M25512 Pain in left shoulder: Secondary | ICD-10-CM

## 2022-02-24 DIAGNOSIS — M6281 Muscle weakness (generalized): Secondary | ICD-10-CM

## 2022-02-24 NOTE — Therapy (Signed)
OUTPATIENT PHYSICAL THERAPY TREATMENT NOTE   Patient Name: Rhonda [name] MRN: [id number] DOB:1991/03/30, 31 y.o., female Today's Date: 02/24/2022  PCP: Patient, No Pcp Per (Inactive) REFERRING PROVIDER: [name], MD   PT End of Session - 02/24/22 1419     Visit Number 19    Number of Visits 35    Date for PT Re-Evaluation 05/23/22    Authorization Type BCBS    PT Start Time 1347    PT Stop Time 1415    PT Time Calculation (min) 28 min    Activity Tolerance Patient tolerated treatment well    Behavior During Therapy WFL for tasks assessed/performed                       Past Medical History:  Diagnosis Date   Medical history non-contributory    Shoulder injury    Past Surgical History:  Procedure Laterality Date   SHOULDER ARTHROSCOPY WITH LABRAL REPAIR Left 11/11/2021   Procedure: Left SHOULDER ARTHROSCOPY WITH LABRAL REPAIR;  Surgeon: [name], MD;  Location: [place];  Service: Orthopedics;  Laterality: Left;   TONSILLECTOMY     wisdom teeth     Patient Active Problem List   Diagnosis Date Noted   Major depressive disorder, recurrent episode, severe with anxious distress ([place]) 01/21/2022   Tear of left glenoid labrum    Hyperemesis gravidarum 01/08/2020   Leukocytosis 01/08/2020   Abnormal uterine bleeding 10/11/2018    REFERRING DIAG: S/p Lt labral repair  THERAPY DIAG:  Acute pain of left shoulder  Muscle weakness (generalized)  PERTINENT HISTORY: none  PRECAUTIONS: shoulder, DOS 11/11/21  SUBJECTIVE:  Pt has soreness in the shoulder in the shoulder from doing more exercise.   PAIN:  Are you having pain? No pain right now, soreness NPRS scale: 0/10 Pain location: Lt shoulder  Aggravating factors: laying on it Relieving factors: meds, ice  OBJECTIVE:    PATIENT SURVEYS:  FOTO 25 FOTO: 50.2 at 7th visit FOTO: 10th visit 57 pts FOTO: 18th visit  67 pts  AROM: L flexion 167 ABD: 165 IR: T6 reach ER:  T5 Ext: 60    HHD MMT: measured in lbs  L flexion: 23.9  ABD: 29.6 IR: 16.8 ER: 16.4             TODAY'S TREATMENT:   5/25  MANUAL:  joint most post and inf grade III UBE L1 2 min retro and fwd  Review of Home Exercises - Shoulder Overhead Press in Abduction with Dumbbells  - 1 x daily - 2-3 x weekly - 3 sets - 10 reps - Seated Row Cable Machine  - 1 x daily - 2-3 x weekly - 3 sets - 10 reps - Push-Up on Counter  - 1 x daily - 2-3 x weekly - 3 sets - 10 reps - Standing Single Arm Shoulder Internal Rotation in Abduction with Anchored Resistance  - 1 x daily - 2-3 x weekly - 3 sets - 10 reps - Standing Single Arm Shoulder External Rotation in Abduction with Anchored Resistance  - 1 x daily - 2-3 x weekly - 3 sets - 10 reps - Standing Shoulder Internal Rotation Stretch with Towel  - 1 x daily - 2-3 x weekly - 3 sets - 10 reps   5/23  MANUAL:  joint most post and inf grade III  Bent over DB row 7lbs 3x10 Arnold press  5lbs 3x10 (VC for rotation and scap position) Bar height  push up 2x10 Cable row 25lbs 3x10 IR/ER 90/90 RTB 2x10 each   4/27: MANUAL: TPDN with skilled palpation and monitoring: upper traps, levator scap, infraspinatus External rotation yellow tband Shoulder horiz abd & flexion yellow tband at wall Qped lawn mower yellow tband   4/25: MANUAL: scapular mobility, STM to infraspinatus, lats, subscap Edu on theracane Standing in mirror with 1lb: flexion, abd, scaption to 90- small circles; bent over shoulder extension with small circles, abd to 90 + GHJ ER  Cues for core engagement   4/18:  MANUAL:  joint most post and inf grade III  Post capsule stretch 30s 3x Cane ext with ADD 3s 10x  IR cane stretch 10s 10x S/L ER 2lbs 3x10 Arnold press  2lbs 3x10 (VC for rotation and scap position) S/L ABD 2lbs 2x10 (focus on eccentric) OH triceps stretch 30s 3x  UBE fwd and retro 2 min each  4/13: MANUAL: STM L UT, levator, rhomboids, infra       PATIENT EDUCATION: Education details: exam findings, exercise progression, DOMS expectations, muscle firing,  envelope of function, HEP   Person educated: Patient Education method: Explanation, Demonstration, Tactile cues, Verbal cues, and Handouts- text HEP Education comprehension: verbalized understanding, returned demonstration, verbal cues required, tactile cues required, and needs further education     HOME EXERCISE PROGRAM: Access Code: [email or web address] URL: [email or web address]    ASSESSMENT:   CLINICAL IMPRESSION: Pt is 14 wks post labral repair. Pt with improved joint mobility and ROM following today's session. Pt also gave verbal understanding to HEP update as she will drop frequency with PT and continue with more self guiding gym strengthening. Plan to continue with progressing intensity as well as OH stability and motor control at future visits.  Pt would benefit from continued skilled therapy in order to reach goals and maximize functional L UE strength and ROM for full return to PLOF.    Objective impairments include decreased activity tolerance, decreased knowledge of condition, decreased ROM, decreased strength, increased muscle spasms, impaired flexibility, impaired UE functional use, and pain. These impairments are limiting patient from cleaning, community activity, driving, meal prep, occupation, laundry, and shopping. Personal factors including Profession and Time since onset of injury/illness/exacerbation are also affecting patient's functional outcome. Patient will benefit from skilled PT to address above impairments and improve overall function.   REHAB POTENTIAL: Good   CLINICAL DECISION MAKING: Stable/uncomplicated   EVALUATION COMPLEXITY: Low     GOALS: Goals reviewed with patient? Yes   SHORT TERM GOALS:   STG Name Target Date Goal status  1 Pt will demonstrate progression with post op protocol Baseline: will progress as appropriate 12/14/2021  achieved  2 Independent in donning/doffing sling Baseline: educated at eval 11/30/2021 achieved                                                 LONG TERM GOALS:    LTG Name Target Date Goal status  1 Pt will demo proper form with lifting 10lb to overhead shelf Baseline: 01/25/2022 achieved  2 Pt will demo ROM within 10 deg of Rt UE without compensation Baseline:unable at eval 01/25/2022 achieved  3 Pt will demo at least 80% strength in Left arm as Rt in dynamometry testing Baseline: unable to test at eval 05/23/22 ongoing  4 Pt will begin increased strength training for return work regarding lifting requirements  Baseline: will progress as appropriate 05/23/22 ongoing  5 Pt will verbalize pain <=2/10 with ADLs Baseline: unable to use Lt UE for ADLs at eval 05/23/22 MET   6  Pt will be able to reach [facility] and carry/hold >25 lbs in order to demonstrate functional improvement in L UE strength for return to PLOF and exercise.                  PLAN: PT FREQUENCY: 1-2x/week   PT DURATION: 12 weeks (likely DC in 8 wks)   PLANNED INTERVENTIONS: Therapeutic exercises, Therapeutic activity, Neuro Muscular re-education, Patient/Family education, Joint mobilization, Aquatic Therapy, Dry Needling, Electrical stimulation, Cryotherapy, Moist heat, scar mobilization, Taping, and Manual therapy   PLAN FOR NEXT SESSION: progress shoulder strengthening and OH strength  Daleen Bo PT, DPT 02/24/22 2:22 PM

## 2022-03-01 ENCOUNTER — Encounter (HOSPITAL_BASED_OUTPATIENT_CLINIC_OR_DEPARTMENT_OTHER): Payer: Self-pay | Admitting: Physical Therapy

## 2022-03-01 ENCOUNTER — Ambulatory Visit (HOSPITAL_BASED_OUTPATIENT_CLINIC_OR_DEPARTMENT_OTHER): Payer: Self-pay | Admitting: Physical Therapy

## 2022-03-01 DIAGNOSIS — M6281 Muscle weakness (generalized): Secondary | ICD-10-CM

## 2022-03-01 DIAGNOSIS — M25512 Pain in left shoulder: Secondary | ICD-10-CM | POA: Diagnosis not present

## 2022-03-01 NOTE — Therapy (Signed)
OUTPATIENT PHYSICAL THERAPY TREATMENT NOTE   Patient Name: Rhonda [name] MRN: [id number] DOB:Jul 09, 1991, 31 y.o., female Today's Date: 03/01/2022  PCP: Patient, No Pcp Per (Inactive) REFERRING PROVIDER: [name], MD   PT End of Session - 03/01/22 1153     Visit Number 20    Number of Visits 35    Date for PT Re-Evaluation 05/23/22    Authorization Type BCBS    PT Start Time 1153    PT Stop Time 1229    PT Time Calculation (min) 36 min    Activity Tolerance Patient tolerated treatment well    Behavior During Therapy WFL for tasks assessed/performed                        Past Medical History:  Diagnosis Date   Medical history non-contributory    Shoulder injury    Past Surgical History:  Procedure Laterality Date   SHOULDER ARTHROSCOPY WITH LABRAL REPAIR Left 11/11/2021   Procedure: Left SHOULDER ARTHROSCOPY WITH LABRAL REPAIR;  Surgeon: [name], MD;  Location: [place];  Service: Orthopedics;  Laterality: Left;   TONSILLECTOMY     wisdom teeth     Patient Active Problem List   Diagnosis Date Noted   Major depressive disorder, recurrent episode, severe with anxious distress ([place]) 01/21/2022   Tear of left glenoid labrum    Hyperemesis gravidarum 01/08/2020   Leukocytosis 01/08/2020   Abnormal uterine bleeding 10/11/2018    REFERRING DIAG: S/p Lt labral repair  THERAPY DIAG:  Acute pain of left shoulder  Muscle weakness (generalized)  PERTINENT HISTORY: none  PRECAUTIONS: shoulder, DOS 11/11/21  SUBJECTIVE:  just sore on the top of the shoulder   PAIN:  Are you having pain? No pain right now, soreness NPRS scale: 0/10 Pain location: Lt shoulder  Aggravating factors: laying on it Relieving factors: meds, ice  OBJECTIVE:    PATIENT SURVEYS:  FOTO 25 FOTO: 50.2 at 7th visit FOTO: 10th visit 57 pts FOTO: 18th visit  67 pts  AROM: L flexion 167 ABD: 165 IR: T6 reach ER: T5 Ext: 60    HHD MMT: measured  in lbs  L flexion: 23.9  ABD: 29.6 IR: 16.8 ER: 16.4             TODAY'S TREATMENT:  5/30: MANUAL: STM Lt upper trap, levator, scalenes Seated flexion to 90 + horiz abd 1lb Seated OH flexion + abduction lower 1lb Subscap stretch on wall Standing biceps curls, bent over rows 6lb each hand Bent over shoulder ext + IR behind back, 1lb without touching back Standing large range ABCs 1lb Side plank on counter   5/25  MANUAL:  joint most post and inf grade III UBE L1 2 min retro and fwd  Review of Home Exercises - Shoulder Overhead Press in Abduction with Dumbbells  - 1 x daily - 2-3 x weekly - 3 sets - 10 reps - Seated Row Cable Machine  - 1 x daily - 2-3 x weekly - 3 sets - 10 reps - Push-Up on Counter  - 1 x daily - 2-3 x weekly - 3 sets - 10 reps - Standing Single Arm Shoulder Internal Rotation in Abduction with Anchored Resistance  - 1 x daily - 2-3 x weekly - 3 sets - 10 reps - Standing Single Arm Shoulder External Rotation in Abduction with Anchored Resistance  - 1 x daily - 2-3 x weekly - 3 sets - 10 reps - Standing  Shoulder Internal Rotation Stretch with Towel  - 1 x daily - 2-3 x weekly - 3 sets - 10 reps   5/23  MANUAL:  joint most post and inf grade III  Bent over DB row 7lbs 3x10 Arnold press  5lbs 3x10 (VC for rotation and scap position) Bar height push up 2x10 Cable row 25lbs 3x10 IR/ER 90/90 RTB 2x10 each   PATIENT EDUCATION: Education details: exam findings, exercise progression, DOMS expectations, muscle firing,  envelope of function, HEP   Person educated: Patient Education method: Explanation, Demonstration, Tactile cues, Verbal cues, and Handouts- text HEP Education comprehension: verbalized understanding, returned demonstration, verbal cues required, tactile cues required, and needs further education     HOME EXERCISE PROGRAM: Access Code: [email or web address] URL: [email or web address]    ASSESSMENT:   CLINICAL IMPRESSION: Pt was trying  to increase abduction ROM in an internally rotated position causing soreness- we discussed mechanics behind this movement. Overall doing very well and will continue to progress strength/endurance.    Objective impairments include decreased activity tolerance, decreased knowledge of condition, decreased ROM, decreased strength, increased muscle spasms, impaired flexibility, impaired UE functional use, and pain. These impairments are limiting patient from cleaning, community activity, driving, meal prep, occupation, laundry, and shopping. Personal factors including Profession and Time since onset of injury/illness/exacerbation are also affecting patient's functional outcome. Patient will benefit from skilled PT to address above impairments and improve overall function.   REHAB POTENTIAL: Good   CLINICAL DECISION MAKING: Stable/uncomplicated   EVALUATION COMPLEXITY: Low     GOALS: Goals reviewed with patient? Yes   SHORT TERM GOALS:   STG Name Target Date Goal status  1 Pt will demonstrate progression with post op protocol Baseline: will progress as appropriate 12/14/2021 achieved  2 Independent in donning/doffing sling Baseline: educated at eval 11/30/2021 achieved                                                 LONG TERM GOALS:    LTG Name Target Date Goal status  1 Pt will demo proper form with lifting 10lb to overhead shelf Baseline: 01/25/2022 achieved  2 Pt will demo ROM within 10 deg of Rt UE without compensation Baseline:unable at eval 01/25/2022 achieved  3 Pt will demo at least 80% strength in Left arm as Rt in dynamometry testing Baseline: unable to test at eval 05/23/22 ongoing  4 Pt will begin increased strength training for return work regarding lifting requirements Baseline: will progress as appropriate 05/23/22 ongoing  5 Pt will verbalize pain <=2/10 with ADLs Baseline: unable to use Lt UE for ADLs at eval 05/23/22 MET   6  Pt will be able to reach [facility] and carry/hold >25  lbs in order to demonstrate functional improvement in L UE strength for return to PLOF and exercise.                  PLAN: PT FREQUENCY: 1-2x/week   PT DURATION: 12 weeks (likely DC in 8 wks)   PLANNED INTERVENTIONS: Therapeutic exercises, Therapeutic activity, Neuro Muscular re-education, Patient/Family education, Joint mobilization, Aquatic Therapy, Dry Needling, Electrical stimulation, Cryotherapy, Moist heat, scar mobilization, Taping, and Manual therapy   PLAN FOR NEXT SESSION: progress shoulder strengthening and OH strength  Shreshta Medley C. Sadiel Mota PT, DPT 03/01/22 12:36 PM

## 2022-03-03 ENCOUNTER — Encounter (HOSPITAL_BASED_OUTPATIENT_CLINIC_OR_DEPARTMENT_OTHER): Payer: Self-pay

## 2022-03-03 ENCOUNTER — Ambulatory Visit (HOSPITAL_BASED_OUTPATIENT_CLINIC_OR_DEPARTMENT_OTHER): Payer: Self-pay | Admitting: Physical Therapy

## 2022-03-08 ENCOUNTER — Encounter (HOSPITAL_BASED_OUTPATIENT_CLINIC_OR_DEPARTMENT_OTHER): Payer: Self-pay | Admitting: Physical Therapy

## 2022-03-08 ENCOUNTER — Ambulatory Visit (HOSPITAL_BASED_OUTPATIENT_CLINIC_OR_DEPARTMENT_OTHER): Payer: Self-pay | Attending: Orthopaedic Surgery | Admitting: Physical Therapy

## 2022-03-08 DIAGNOSIS — M25512 Pain in left shoulder: Secondary | ICD-10-CM | POA: Diagnosis present

## 2022-03-08 DIAGNOSIS — M6281 Muscle weakness (generalized): Secondary | ICD-10-CM | POA: Diagnosis present

## 2022-03-08 NOTE — Therapy (Signed)
OUTPATIENT PHYSICAL THERAPY TREATMENT NOTE   Patient Name: Rhonda [name] MRN: [id number] DOB:November 25, 1990, 31 y.o., female Today's Date: 03/08/2022  PCP: Patient, No Pcp Per (Inactive) REFERRING PROVIDER: [name], MD   PT End of Session - 03/08/22 1444     Visit Number 21    Number of Visits 35    Date for PT Re-Evaluation 05/23/22    Authorization Type BCBS    PT Start Time 1443    PT Stop Time 1514    PT Time Calculation (min) 31 min    Activity Tolerance Patient tolerated treatment well    Behavior During Therapy WFL for tasks assessed/performed                        Past Medical History:  Diagnosis Date   Medical history non-contributory    Shoulder injury    Past Surgical History:  Procedure Laterality Date   SHOULDER ARTHROSCOPY WITH LABRAL REPAIR Left 11/11/2021   Procedure: Left SHOULDER ARTHROSCOPY WITH LABRAL REPAIR;  Surgeon: [name], MD;  Location: [place];  Service: Orthopedics;  Laterality: Left;   TONSILLECTOMY     wisdom teeth     Patient Active Problem List   Diagnosis Date Noted   Major depressive disorder, recurrent episode, severe with anxious distress ([place]) 01/21/2022   Tear of left glenoid labrum    Hyperemesis gravidarum 01/08/2020   Leukocytosis 01/08/2020   Abnormal uterine bleeding 10/11/2018    REFERRING DIAG: S/p Lt labral repair  THERAPY DIAG:  Acute pain of left shoulder  Muscle weakness (generalized)  PERTINENT HISTORY: none  PRECAUTIONS: shoulder, DOS 11/11/21  SUBJECTIVE:  I am moving this week.    PAIN:  Are you having pain? No pain right now, soreness NPRS scale: 0/10 Pain location: Lt shoulder  Aggravating factors: laying on it Relieving factors: meds, ice  OBJECTIVE:    PATIENT SURVEYS:  FOTO 25 FOTO: 50.2 at 7th visit FOTO: 10th visit 57 pts FOTO: 18th visit  67 pts  AROM: L flexion 167 ABD: 165 IR: T6 reach ER: T5 Ext: 60    HHD MMT: measured in lbs  L  flexion: 23.9  ABD: 29.6 IR: 16.8 ER: 16.4             TODAY'S TREATMENT:  6/6: UBE retro 3 min L3 OH press 5lb kettle bell ER to shoulder load + OH press 5lb kettle bell Bil triceps, bent over ext 3lb Yellow tband 90/90 IR & ER Prone Y, T, snow angels Cable lat pull down & rows for form   5/30: MANUAL: STM Lt upper trap, levator, scalenes Seated flexion to 90 + horiz abd 1lb Seated OH flexion + abduction lower 1lb Subscap stretch on wall Standing biceps curls, bent over rows 6lb each hand Bent over shoulder ext + IR behind back, 1lb without touching back Standing large range ABCs 1lb Side plank on counter   5/25  MANUAL:  joint most post and inf grade III UBE L1 2 min retro and fwd  Review of Home Exercises - Shoulder Overhead Press in Abduction with Dumbbells  - 1 x daily - 2-3 x weekly - 3 sets - 10 reps - Seated Row Cable Machine  - 1 x daily - 2-3 x weekly - 3 sets - 10 reps - Push-Up on Counter  - 1 x daily - 2-3 x weekly - 3 sets - 10 reps - Standing Single Arm Shoulder Internal Rotation in Abduction with  Anchored Resistance  - 1 x daily - 2-3 x weekly - 3 sets - 10 reps - Standing Single Arm Shoulder External Rotation in Abduction with Anchored Resistance  - 1 x daily - 2-3 x weekly - 3 sets - 10 reps - Standing Shoulder Internal Rotation Stretch with Towel  - 1 x daily - 2-3 x weekly - 3 sets - 10 reps      PATIENT EDUCATION: Education details: exercise form/rationale   Person educated: Patient Education method: Explanation, Demonstration, Tactile cues, Verbal cues, and Handouts- text HEP Education comprehension: verbalized understanding, returned demonstration, verbal cues required, tactile cues required, and needs further education     HOME EXERCISE PROGRAM: Access Code: [email or web address] URL: [email or web address]    ASSESSMENT:   CLINICAL IMPRESSION: Reviewed form on gym machines as she is beginning to return to the gym on a regular  basis.    Objective impairments include decreased activity tolerance, decreased knowledge of condition, decreased ROM, decreased strength, increased muscle spasms, impaired flexibility, impaired UE functional use, and pain. These impairments are limiting patient from cleaning, community activity, driving, meal prep, occupation, laundry, and shopping. Personal factors including Profession and Time since onset of injury/illness/exacerbation are also affecting patient's functional outcome. Patient will benefit from skilled PT to address above impairments and improve overall function.   REHAB POTENTIAL: Good   CLINICAL DECISION MAKING: Stable/uncomplicated   EVALUATION COMPLEXITY: Low     GOALS: Goals reviewed with patient? Yes   SHORT TERM GOALS:   STG Name Target Date Goal status  1 Pt will demonstrate progression with post op protocol Baseline: will progress as appropriate 12/14/2021 achieved  2 Independent in donning/doffing sling Baseline: educated at eval 11/30/2021 achieved                                                 LONG TERM GOALS:    LTG Name Target Date Goal status  1 Pt will demo proper form with lifting 10lb to overhead shelf Baseline: 01/25/2022 achieved  2 Pt will demo ROM within 10 deg of Rt UE without compensation Baseline:unable at eval 01/25/2022 achieved  3 Pt will demo at least 80% strength in Left arm as Rt in dynamometry testing Baseline: unable to test at eval 05/23/22 ongoing  4 Pt will begin increased strength training for return work regarding lifting requirements Baseline: will progress as appropriate 05/23/22 ongoing  5 Pt will verbalize pain <=2/10 with ADLs Baseline: unable to use Lt UE for ADLs at eval 05/23/22 MET   6  Pt will be able to reach [facility] and carry/hold >25 lbs in order to demonstrate functional improvement in L UE strength for return to PLOF and exercise.                  PLAN: PT FREQUENCY: 1-2x/week   PT DURATION: 12 weeks (likely DC  in 8 wks)   PLANNED INTERVENTIONS: Therapeutic exercises, Therapeutic activity, Neuro Muscular re-education, Patient/Family education, Joint mobilization, Aquatic Therapy, Dry Needling, Electrical stimulation, Cryotherapy, Moist heat, scar mobilization, Taping, and Manual therapy   PLAN FOR NEXT SESSION: progress shoulder strengthening and OH strength, advance gym program  Annaleia Pence C. Minola Guin PT, DPT 03/08/22 8:09 PM

## 2022-03-10 ENCOUNTER — Ambulatory Visit (HOSPITAL_BASED_OUTPATIENT_CLINIC_OR_DEPARTMENT_OTHER): Payer: Self-pay | Admitting: Physical Therapy

## 2022-03-10 ENCOUNTER — Encounter (HOSPITAL_BASED_OUTPATIENT_CLINIC_OR_DEPARTMENT_OTHER): Payer: Self-pay

## 2022-03-14 ENCOUNTER — Ambulatory Visit (INDEPENDENT_AMBULATORY_CARE_PROVIDER_SITE_OTHER): Payer: Self-pay | Admitting: Orthopaedic Surgery

## 2022-03-14 DIAGNOSIS — S43432A Superior glenoid labrum lesion of left shoulder, initial encounter: Secondary | ICD-10-CM

## 2022-03-14 NOTE — Progress Notes (Signed)
                                 Post Operative Evaluation    Procedure/Date of Surgery: 11/11/21 -left shoulder labral repair  Interval History:   Presents today for follow-up of her left shoulder.  Overall she is doing quite well.  She is continue to work strengthening of the left shoulder.  She is progressing quite well.  No complaints with regard to the left shoulder.   PMH/PSH/Family History/Social History/Meds/Allergies:    Past Medical History:  Diagnosis Date   Medical history non-contributory    Shoulder injury    Past Surgical History:  Procedure Laterality Date   SHOULDER ARTHROSCOPY WITH LABRAL REPAIR Left 11/11/2021   Procedure: Left SHOULDER ARTHROSCOPY WITH LABRAL REPAIR;  Surgeon: [name], MD;  Location: [place];  Service: Orthopedics;  Laterality: Left;   TONSILLECTOMY     wisdom teeth     Social History   Socioeconomic History   Marital status: Single    Spouse name: Not on file   Number of children: Not on file   Years of education: Not on file   Highest education level: Not on file  Occupational History   Not on file  Tobacco Use   Smoking status: Former    Types: Cigarettes    Quit date: 09/30/2012    Years since quitting: 9.4   Smokeless tobacco: Never  Vaping Use   Vaping Use: Never used  Substance and Sexual Activity   Alcohol use: Not Currently   Drug use: Not Currently   Sexual activity: Yes    Birth control/protection: None  Other Topics Concern   Not on file  Social History Narrative   Not on file   Social Determinants of Health   Financial Resource Strain: Not on file  Food Insecurity: Not on file  Transportation Needs: Not on file  Physical Activity: Not on file  Stress: Not on file  Social Connections: Not on file   Family History  Problem Relation Age of Onset   Hypertension Other    No Known Allergies No current outpatient medications on file.   No current facility-administered medications  for this visit.   No results found.  Review of Systems:   A ROS was performed including pertinent positives and negatives as documented in the HPI.   Musculoskeletal Exam:     Incisions are well-healed.  Left forward active elevation is to 170 degrees equal to contralateral side.  External rotation at the side is to 60 degrees equal to contralateral side.  Internal rotation is to L1 sensation is intact left upper extremity  Imaging:    None  I personally reviewed and interpreted the radiographs.   Assessment:   31 year old female who is 4 months status post left shoulder labral repair overall doing extremely well.  At this time she will continue to work on strengthening and conditioning.  I will see her back in 3 months for final check  -Return to clinic in 12 weeks      I personally saw and evaluated the patient, and participated in the management and treatment plan.  [name], MD Attending Physician, Orthopedic Surgery  This document was dictated using Dragon voice recognition software. A reasonable attempt at proof reading has been made to minimize errors.

## 2022-03-15 ENCOUNTER — Encounter (HOSPITAL_BASED_OUTPATIENT_CLINIC_OR_DEPARTMENT_OTHER): Payer: Self-pay | Admitting: Physical Therapy

## 2022-03-17 ENCOUNTER — Ambulatory Visit (HOSPITAL_BASED_OUTPATIENT_CLINIC_OR_DEPARTMENT_OTHER): Payer: Self-pay | Admitting: Physical Therapy

## 2022-03-22 ENCOUNTER — Encounter (HOSPITAL_BASED_OUTPATIENT_CLINIC_OR_DEPARTMENT_OTHER): Payer: Self-pay | Admitting: Physical Therapy

## 2022-03-24 ENCOUNTER — Ambulatory Visit (HOSPITAL_BASED_OUTPATIENT_CLINIC_OR_DEPARTMENT_OTHER): Payer: Self-pay | Admitting: Physical Therapy

## 2022-03-24 ENCOUNTER — Encounter (HOSPITAL_BASED_OUTPATIENT_CLINIC_OR_DEPARTMENT_OTHER): Payer: Self-pay | Admitting: Physical Therapy

## 2022-03-24 DIAGNOSIS — M25512 Pain in left shoulder: Secondary | ICD-10-CM | POA: Diagnosis not present

## 2022-03-24 DIAGNOSIS — M6281 Muscle weakness (generalized): Secondary | ICD-10-CM

## 2022-03-24 NOTE — Therapy (Signed)
OUTPATIENT PHYSICAL THERAPY TREATMENT NOTE   Patient Name: Rhonda [name] MRN: [id number] DOB:08/16/1991, 31 y.o., female Today's Date: 03/24/2022  PCP: Patient, No Pcp Per REFERRING PROVIDER: [name], MD   PT End of Session - 03/24/22 1436     Visit Number 22    Number of Visits 35    Date for PT Re-Evaluation 05/23/22    Authorization Type BCBS    PT Start Time 1434    PT Stop Time 1508    PT Time Calculation (min) 34 min    Activity Tolerance Patient tolerated treatment well    Behavior During Therapy WFL for tasks assessed/performed                         Past Medical History:  Diagnosis Date   Medical history non-contributory    Shoulder injury    Past Surgical History:  Procedure Laterality Date   SHOULDER ARTHROSCOPY WITH LABRAL REPAIR Left 11/11/2021   Procedure: Left SHOULDER ARTHROSCOPY WITH LABRAL REPAIR;  Surgeon: [name], MD;  Location: [place];  Service: Orthopedics;  Laterality: Left;   TONSILLECTOMY     wisdom teeth     Patient Active Problem List   Diagnosis Date Noted   Major depressive disorder, recurrent episode, severe with anxious distress ([place]) 01/21/2022   Tear of left glenoid labrum    Hyperemesis gravidarum 01/08/2020   Leukocytosis 01/08/2020   Abnormal uterine bleeding 10/11/2018    REFERRING DIAG: S/p Lt labral repair  THERAPY DIAG:  Acute pain of left shoulder  Muscle weakness (generalized)  PERTINENT HISTORY: none  PRECAUTIONS: shoulder, DOS 11/11/21  SUBJECTIVE:  sore after the move but ok. Denies ain in activities- just feels funny. If I move without thinking it might catch my attention.    PAIN:  Are you having pain? No pain right now, soreness NPRS scale: 0/10 Pain location: Lt shoulder  Aggravating factors: laying on it Relieving factors: meds, ice  OBJECTIVE:    PATIENT SURVEYS:  FOTO 25 FOTO: 50.2 at 7th visit FOTO: 10th visit 57 pts FOTO: 18th visit  67  pts  AROM: L flexion 167    6/22: 166 ABD: 165         6/22: 164 IR: T6 reach ER: T5 Ext: 60    HHD MMT: measured in lbs  L flexion: 23.9  ABD: 29.6 IR: 16.8 ER: 16.4             TODAY'S TREATMENT:  6/22: 10lb biceps curls & OH press UBE retro L2 2.5 min Cable machine- row from floor- narrow biceps & wide elbow pull, row seated, lat pull down  6/6: UBE retro 3 min L3 OH press 5lb kettle bell ER to shoulder load + OH press 5lb kettle bell Bil triceps, bent over ext 3lb Yellow tband 90/90 IR & ER Prone Y, T, snow angels Cable lat pull down & rows for form   5/30: MANUAL: STM Lt upper trap, levator, scalenes Seated flexion to 90 + horiz abd 1lb Seated OH flexion + abduction lower 1lb Subscap stretch on wall Standing biceps curls, bent over rows 6lb each hand Bent over shoulder ext + IR behind back, 1lb without touching back Standing large range ABCs 1lb Side plank on counter        PATIENT EDUCATION: Education details: exercise form/rationale   Person educated: Patient Education method: Explanation, Demonstration, Tactile cues, Verbal cues, and Handouts- text HEP Education comprehension: verbalized understanding, returned  demonstration, verbal cues required, tactile cues required, and needs further education     HOME EXERCISE PROGRAM: Access Code: [email or web address] URL: [email or web address]    ASSESSMENT:   CLINICAL IMPRESSION: Continued to progress weight- heavier resistance for shorter duration & a little lighter for endurance challenges.    Objective impairments include decreased activity tolerance, decreased knowledge of condition, decreased ROM, decreased strength, increased muscle spasms, impaired flexibility, impaired UE functional use, and pain. These impairments are limiting patient from cleaning, community activity, driving, meal prep, occupation, laundry, and shopping. Personal factors including Profession and Time since onset of  injury/illness/exacerbation are also affecting patient's functional outcome. Patient will benefit from skilled PT to address above impairments and improve overall function.   REHAB POTENTIAL: Good   CLINICAL DECISION MAKING: Stable/uncomplicated   EVALUATION COMPLEXITY: Low     GOALS: Goals reviewed with patient? Yes   SHORT TERM GOALS:   STG Name Target Date Goal status  1 Pt will demonstrate progression with post op protocol Baseline: will progress as appropriate 12/14/2021 achieved  2 Independent in donning/doffing sling Baseline: educated at eval 11/30/2021 achieved                                                 LONG TERM GOALS:    LTG Name Target Date Goal status  1 Pt will demo proper form with lifting 10lb to overhead shelf Baseline: 01/25/2022 achieved  2 Pt will demo ROM within 10 deg of Rt UE without compensation Baseline:unable at eval 01/25/2022 achieved  3 Pt will demo at least 80% strength in Left arm as Rt in dynamometry testing Baseline: unable to test at eval 05/23/22 ongoing  4 Pt will begin increased strength training for return work regarding lifting requirements Baseline: will progress as appropriate 05/23/22 ongoing  5 Pt will verbalize pain <=2/10 with ADLs Baseline: unable to use Lt UE for ADLs at eval 05/23/22 MET   6  Pt will be able to reach [facility] and carry/hold >25 lbs in order to demonstrate functional improvement in L UE strength for return to PLOF and exercise.                  PLAN: PT FREQUENCY: 1-2x/week   PT DURATION: 12 weeks (likely DC in 8 wks)   PLANNED INTERVENTIONS: Therapeutic exercises, Therapeutic activity, Neuro Muscular re-education, Patient/Family education, Joint mobilization, Aquatic Therapy, Dry Needling, Electrical stimulation, Cryotherapy, Moist heat, scar mobilization, Taping, and Manual therapy   PLAN FOR NEXT SESSION: take muscle testing, address goals, ready for d/c  Nicolas Banh C. Chee Kinslow PT, DPT 03/24/22 3:08  PM

## 2022-03-29 ENCOUNTER — Other Ambulatory Visit (HOSPITAL_BASED_OUTPATIENT_CLINIC_OR_DEPARTMENT_OTHER): Payer: Self-pay | Admitting: Orthopaedic Surgery

## 2022-03-29 ENCOUNTER — Ambulatory Visit (HOSPITAL_BASED_OUTPATIENT_CLINIC_OR_DEPARTMENT_OTHER): Payer: Self-pay | Admitting: Physical Therapy

## 2022-03-29 ENCOUNTER — Encounter (HOSPITAL_BASED_OUTPATIENT_CLINIC_OR_DEPARTMENT_OTHER): Payer: Self-pay | Admitting: Physical Therapy

## 2022-03-29 DIAGNOSIS — M25312 Other instability, left shoulder: Secondary | ICD-10-CM

## 2022-03-29 DIAGNOSIS — M25512 Pain in left shoulder: Secondary | ICD-10-CM | POA: Diagnosis not present

## 2022-03-29 DIAGNOSIS — M6281 Muscle weakness (generalized): Secondary | ICD-10-CM

## 2022-06-13 ENCOUNTER — Ambulatory Visit (HOSPITAL_BASED_OUTPATIENT_CLINIC_OR_DEPARTMENT_OTHER): Payer: Medicaid Other | Admitting: Orthopaedic Surgery

## 2022-08-12 ENCOUNTER — Telehealth (HOSPITAL_COMMUNITY): Payer: Self-pay | Admitting: Professional

## 2022-08-12 NOTE — Telephone Encounter (Signed)
Cln rtn pt call. Pt reports she is "at her wits end." Pt denies current SI/HI, endorses passive SI. Pt wants extra support. Pt reports she was unable to attend in May and would like to try again. Pt is finishing school and wants to schedule CCA after finishing. Cln and pt spent time discussing crisis options if thoughts progress including [facility], crisis hotline, ED, 911. Pt reports she will reach out for help if unable to manage thoughts. Pt reports being hopeful having this appointment scheduled. Email: [email or web address]

## 2022-08-17 ENCOUNTER — Telehealth (HOSPITAL_COMMUNITY): Payer: Self-pay | Admitting: Professional

## 2022-08-29 ENCOUNTER — Other Ambulatory Visit (HOSPITAL_COMMUNITY): Payer: Self-pay | Attending: Psychiatry | Admitting: Professional

## 2022-08-29 DIAGNOSIS — F332 Major depressive disorder, recurrent severe without psychotic features: Secondary | ICD-10-CM

## 2022-08-31 ENCOUNTER — Ambulatory Visit (INDEPENDENT_AMBULATORY_CARE_PROVIDER_SITE_OTHER): Payer: No Payment, Other | Admitting: Licensed Clinical Social Worker

## 2022-08-31 ENCOUNTER — Other Ambulatory Visit (HOSPITAL_COMMUNITY): Payer: Self-pay

## 2022-08-31 ENCOUNTER — Ambulatory Visit (HOSPITAL_COMMUNITY): Payer: Self-pay

## 2022-08-31 DIAGNOSIS — F332 Major depressive disorder, recurrent severe without psychotic features: Secondary | ICD-10-CM

## 2022-08-31 DIAGNOSIS — F411 Generalized anxiety disorder: Secondary | ICD-10-CM
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
## 2022-09-01 ENCOUNTER — Ambulatory Visit (HOSPITAL_COMMUNITY): Payer: Self-pay

## 2022-09-01 ENCOUNTER — Other Ambulatory Visit (HOSPITAL_COMMUNITY): Payer: Self-pay

## 2022-09-01 ENCOUNTER — Ambulatory Visit (INDEPENDENT_AMBULATORY_CARE_PROVIDER_SITE_OTHER): Payer: No Payment, Other | Admitting: Licensed Clinical Social Worker

## 2022-09-01 ENCOUNTER — Encounter (HOSPITAL_COMMUNITY): Payer: Self-pay

## 2022-09-01 DIAGNOSIS — G4700 Insomnia, unspecified: Secondary | ICD-10-CM

## 2022-09-01 DIAGNOSIS — F332 Major depressive disorder, recurrent severe without psychotic features: Secondary | ICD-10-CM | POA: Diagnosis not present

## 2022-09-01 DIAGNOSIS — F411 Generalized anxiety disorder: Secondary | ICD-10-CM

## 2022-09-01 MED ORDER — ESCITALOPRAM OXALATE 5 MG PO TABS: 5.0000 mg | ORAL_TABLET | Freq: Every day | ORAL | 0 refills | Status: DC

## 2022-09-01 MED ORDER — TRAZODONE HCL 50 MG PO TABS: 50.0000 mg | ORAL_TABLET | Freq: Every day | ORAL | 0 refills | Status: DC
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
## 2022-09-02 ENCOUNTER — Ambulatory Visit (HOSPITAL_COMMUNITY): Payer: Self-pay

## 2022-09-02 ENCOUNTER — Other Ambulatory Visit (HOSPITAL_COMMUNITY): Payer: Self-pay

## 2022-09-02 ENCOUNTER — Ambulatory Visit (INDEPENDENT_AMBULATORY_CARE_PROVIDER_SITE_OTHER): Payer: Medicaid Other | Admitting: Licensed Clinical Social Worker

## 2022-09-02 DIAGNOSIS — F332 Major depressive disorder, recurrent severe without psychotic features: Secondary | ICD-10-CM | POA: Diagnosis not present

## 2022-09-02 DIAGNOSIS — F411 Generalized anxiety disorder: Secondary | ICD-10-CM

## 2022-09-05 ENCOUNTER — Ambulatory Visit (HOSPITAL_COMMUNITY): Payer: Self-pay

## 2022-09-05 ENCOUNTER — Ambulatory Visit (INDEPENDENT_AMBULATORY_CARE_PROVIDER_SITE_OTHER): Payer: Medicaid Other | Admitting: Licensed Clinical Social Worker

## 2022-09-05 ENCOUNTER — Encounter (HOSPITAL_COMMUNITY): Payer: Self-pay

## 2022-09-05 ENCOUNTER — Other Ambulatory Visit (HOSPITAL_COMMUNITY): Payer: Self-pay

## 2022-09-05 DIAGNOSIS — F332 Major depressive disorder, recurrent severe without psychotic features: Secondary | ICD-10-CM

## 2022-09-05 DIAGNOSIS — G4700 Insomnia, unspecified: Secondary | ICD-10-CM

## 2022-09-05 DIAGNOSIS — F411 Generalized anxiety disorder: Secondary | ICD-10-CM
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
## 2022-09-05 NOTE — Progress Notes (Signed)
BH MD/PA/NP PHP Progress Note   Virtual Visit via Video Note  I connected with Rhonda [name] on 09/05/22 at  9:35 AM EST by a video enabled telemedicine application and verified that I am speaking with the correct person using two identifiers.  Location: Patient: Home Provider: [facility]   I discussed the limitations of evaluation and management by telemedicine and the availability of in person appointments. The patient expressed understanding and agreed to proceed.   09/05/2022 12:28 PM Rhonda [name]  MRN:  [id number]  Chief Complaint:  Chief Complaint  Patient presents with   Follow-up   Depression   Anxiety   HPI:  Rhonda [name] is a 31 yr old female who presents via Virtual Video Visit for follow up and for medication management, she enrolled in the [facility] Program 08/31/2022.  PPHx is significant for Depression, Anxiety, and PTSD, and Multiple Suicide Attempts (~3 OD's and 2020 held gun to head and pulled trigger), and no history of Self Injurious Behavior or Psychiatric Hospitalizations.   She reports that she is doing okay.  She reports that her weekend was mostly okay but a little weird.  She reports that Friday and Saturday went fine.  She reports that when she woke up on Sunday though she felt really low.  She reports that she paced around her house and did not want to leave the house.  She reports that her appetite has been low the last couple days.  She reports that her heart was racing.  She reports that she thinks she could have been triggered by something Saturday night.  She reports that she took her trazodone last night and did not sleep a full 8 hours and that when she awoke this morning she did not have the same thing happen.  She reports no side effects to her medications.  She reports no SI, HI, or AVH.  She reports having some nausea and shaking/weakness yesterday.  She reports her sleep was good last night.  She reports her appetite is poor.  She reports no other concerns at  present.    Visit Diagnosis:    ICD-10-CM   1. MDD (major depressive disorder), recurrent severe, without psychosis ([place])  F33.2     2. Generalized anxiety disorder  F41.1     3. Insomnia, unspecified type  G47.00       Past Psychiatric History: Depression, Anxiety, and PTSD, and Multiple Suicide Attempts (~3 OD's and 2020 held gun to head and pulled trigger), and no history of Self Injurious Behavior or Psychiatric Hospitalizations.   Past Medical History:  Past Medical History:  Diagnosis Date   Anxiety    Depression    Medical history non-contributory    PTSD (post-traumatic stress disorder)    Shoulder injury     Past Surgical History:  Procedure Laterality Date   SHOULDER ARTHROSCOPY WITH LABRAL REPAIR Left 11/11/2021   Procedure: Left SHOULDER ARTHROSCOPY WITH LABRAL REPAIR;  Surgeon: [name], MD;  Location: [facility];  Service: Orthopedics;  Laterality: Left;   TONSILLECTOMY     wisdom teeth      Family Psychiatric History: Maternal Family- multiple members with diagnosis'- Depression, Anxiety, PTSD, Schizophrenia, Bipolar Disorder, ADHD Father- EtOH Abuse Maternal Aunt- Suicide via Hanging 1993  Family History:  Family History  Problem Relation Age of Onset   Alcohol abuse Father    Depression Maternal Aunt    Bipolar disorder Maternal Aunt    Bipolar disorder Maternal Uncle    Depression  Maternal Uncle    Hypertension Other     Social History:  Social History   Socioeconomic History   Marital status: Single    Spouse name: Not on file   Number of children: 0   Years of education: Not on file   Highest education level: Some college, no degree  Occupational History   Not on file  Tobacco Use   Smoking status: Former    Types: Cigarettes    Quit date: 09/30/2012    Years since quitting: 9.9   Smokeless tobacco: Never  Vaping Use   Vaping Use: Never used  Substance and Sexual Activity   Alcohol use: Yes    Comment: social  drinker, past abuse   Drug use: Not Currently   Sexual activity: Yes    Birth control/protection: None  Other Topics Concern   Not on file  Social History Narrative   Not on file   Social Determinants of Health   Financial Resource Strain: Not on file  Food Insecurity: Not on file  Transportation Needs: Not on file  Physical Activity: Not on file  Stress: Not on file  Social Connections: Not on file    Allergies: No Known Allergies  Metabolic Disorder Labs: Lab Results  Component Value Date   HGBA1C 5.2 01/07/2022   MPG 102.54 01/07/2022   No results found for: "PROLACTIN" Lab Results  Component Value Date   CHOL 185 01/07/2022   TRIG 204 (H) 01/07/2022   HDL 40 (L) 01/07/2022   CHOLHDL 4.6 01/07/2022   VLDL 41 (H) 01/07/2022   LDLCALC 104 (H) 01/07/2022   Lab Results  Component Value Date   TSH 1.886 01/07/2022   TSH 2.589 10/10/2018    Therapeutic Level Labs: No results found for: "LITHIUM" No results found for: "VALPROATE" No results found for: "CBMZ"  Current Medications: Current Outpatient Medications  Medication Sig Dispense Refill   escitalopram (LEXAPRO) 5 MG tablet Take 1 tablet (5 mg total) by mouth daily. 30 tablet 0   traZODone (DESYREL) 50 MG tablet Take 1 tablet (50 mg total) by mouth at bedtime. 30 tablet 0   No current facility-administered medications for this visit.     Musculoskeletal: Strength & Muscle Tone: within normal limits Gait & Station:  laying in bed during interview Patient leans: N/A  Psychiatric Specialty Exam: Review of Systems  Respiratory:  Negative for shortness of breath.   Cardiovascular:  Negative for chest pain.  Gastrointestinal:  Positive for nausea (yesterday). Negative for abdominal pain, constipation, diarrhea and vomiting.  Neurological:  Positive for weakness (yesterday). Negative for dizziness and headaches.    unknown if currently breastfeeding.There is no height or weight on file to calculate BMI.   General Appearance: Casual and Fairly Groomed  Eye Contact:  Good  Speech:  Clear and Coherent and Normal Rate  Volume:  Normal  Mood:  Anxious and Dysphoric  Affect:  Congruent  Thought Process:  Coherent and Goal Directed  Orientation:  Full (Time, Place, and Person)  Thought Content: WDL and Logical   Suicidal Thoughts:  No  Homicidal Thoughts:  No  Memory:  Immediate;   Good Recent;   Good  Judgement:  Good  Insight:  Good  Psychomotor Activity:  Normal  Concentration:  Concentration: Good and Attention Span: Good  Recall:  Good  Fund of Knowledge: Good  Language: Good  Akathisia:  Negative  Handed:  Right  AIMS (if indicated): not done  Assets:  Communication Skills Desire for Improvement  Physical Health Resilience  ADL's:  Intact  Cognition: WNL  Sleep:  Good   Screenings: PHQ2-9    Flowsheet Row Counselor from 09/05/2022 in [facility] Counselor from 01/21/2022 in BEHAVIORAL HEALTH PARTIAL HOSPITALIZATION PROGRAM  PHQ-2 Total Score 6 5  PHQ-9 Total Score 25 20      Flowsheet Row Counselor from 09/05/2022 in [facility] Counselor from 01/21/2022 in BEHAVIORAL HEALTH PARTIAL HOSPITALIZATION PROGRAM ED from 01/07/2022 in [facility]  C-SSRS RISK CATEGORY High Risk Error: Q3, 4, or 5 should not be populated when Q2 is No Low Risk        Assessment and Plan:  Rhonda [name] is a 31 yr old female who presents via Virtual Video Visit for follow up and for medication management, she enrolled in the [facility] Program 08/31/2022.  PPHx is significant for Depression, Anxiety, and PTSD, and Multiple Suicide Attempts (~3 OD's and 2020 held gun to head and pulled trigger), and no history of Self Injurious Behavior or Psychiatric Hospitalizations.    Rhonda [name] has tolerated starting Lexapro and trazodone well without side effects.  It seems like she did have a panic attack yesterday but those symptoms have  resolved.  We will not make any changes to her medications at this time but will consider further titration of her Lexapro.  We will continue to monitor.   MDD, Recurrent, Severe, w/out Psychosis  GAD  PTSD: -Continue Lexapro 5 mg daily for depression and anxiety.  No refills sent at this time.   Insomnia: -Continue Trazodone 50 mg QHS.  No refills sent at this time.   Collaboration of Care: Collaboration of Care: Other PHP Program  Patient/Guardian was advised Release of Information must be obtained prior to any record release in order to collaborate their care with an outside provider. Patient/Guardian was advised if they have not already done so to contact the registration department to sign all necessary forms in order for [name] to release information regarding their care.   Consent: Patient/Guardian gives verbal consent for treatment and assignment of benefits for services provided during this visit. Patient/Guardian expressed understanding and agreed to proceed.    Rhonda Franklin, MD 09/05/2022, 12:28 PM    Follow Up Instructions:    I discussed the assessment and treatment plan with the patient. The patient was provided an opportunity to ask questions and all were answered. The patient agreed with the plan and demonstrated an understanding of the instructions.   The patient was advised to call back or seek an in-person evaluation if the symptoms worsen or if the condition fails to improve as anticipated.  I provided 15 minutes of non-face-to-face time during this encounter.   Rhonda Franklin, MD

## 2022-09-06 ENCOUNTER — Ambulatory Visit (INDEPENDENT_AMBULATORY_CARE_PROVIDER_SITE_OTHER): Payer: Medicaid Other | Admitting: Licensed Clinical Social Worker

## 2022-09-06 ENCOUNTER — Ambulatory Visit (HOSPITAL_COMMUNITY): Payer: Self-pay

## 2022-09-06 ENCOUNTER — Other Ambulatory Visit (HOSPITAL_COMMUNITY): Payer: Self-pay

## 2022-09-06 DIAGNOSIS — F411 Generalized anxiety disorder: Secondary | ICD-10-CM

## 2022-09-06 DIAGNOSIS — F332 Major depressive disorder, recurrent severe without psychotic features: Secondary | ICD-10-CM

## 2022-09-07 ENCOUNTER — Ambulatory Visit (HOSPITAL_COMMUNITY): Payer: Self-pay

## 2022-09-07 ENCOUNTER — Ambulatory Visit (INDEPENDENT_AMBULATORY_CARE_PROVIDER_SITE_OTHER): Payer: Medicaid Other | Admitting: Licensed Clinical Social Worker

## 2022-09-07 ENCOUNTER — Other Ambulatory Visit (HOSPITAL_COMMUNITY): Payer: Self-pay

## 2022-09-07 DIAGNOSIS — F332 Major depressive disorder, recurrent severe without psychotic features: Secondary | ICD-10-CM | POA: Diagnosis not present

## 2022-09-07 DIAGNOSIS — F411 Generalized anxiety disorder: Secondary | ICD-10-CM

## 2022-09-07 NOTE — Psych (Signed)
Virtual Visit via Video Note  I connected with Rhonda [name] on 09/07/22 at  9:35 AM EST by a video enabled telemedicine application and verified that I am speaking with the correct person using two identifiers.  Location: Patient: pt's home Provider: clinical home office   I discussed the limitations of evaluation and management by telemedicine and the availability of in person appointments. The patient expressed understanding and agreed to proceed.   I discussed the assessment and treatment plan with the patient. The patient was provided an opportunity to ask questions and all were answered. The patient agreed with the plan and demonstrated an understanding of the instructions.   The patient was advised to call back or seek an in-person evaluation if the symptoms worsen or if the condition fails to improve as anticipated.  I provided 240 minutes of non-face-to-face time during this encounter.   [name]   [facility] PHP THERAPIST PROGRESS NOTE  Rhonda [name] [id number]   Session Time: 9:00 am - 10:00 am  Participation Level: Active  Behavioral Response: CasualAlertAnxious and Depressed  Type of Therapy: Group Therapy  Treatment Goals addressed: Coping  Progress Towards Goals: Progressing  Interventions: CBT, DBT, Solution Focused, Strength-based, Supportive, and Reframing  Therapist Response: Clinician led check-in regarding current stressors and situation, and review of patient completed daily inventory. Clinician utilized active listening and empathetic response and validated patient emotions. Clinician facilitated processing group on pertinent issues.?   Summary: Patient arrived within time allowed. Patient rates her mood at a 3 on a scale of 1-10 with 10 being best. Pt reports, "Little bit on the irritable side, kind of just want to isolate today." Reports she slept okay, slept about 6 hours. Reports she snacked all day yesterday. She denies SI/SH thoughts. Pt  states she does not have a particular topic she would like to discuss during processing. Pt able to process.?Pt engaged in discussion.?      Session Time: 10:00 am - 11:00 am  Participation Level: Active  Behavioral Response: CasualAlertAnxious and Depressed  Type of Therapy: Group Therapy  Treatment Goals addressed: Coping  Progress Towards Goals: Progressing  Interventions: CBT, DBT, Solution Focused, Strength-based, Supportive, and Reframing  Therapist Response: Clinician led group on hopelessness and invited group members to share their frustrations with feeling hopeless. Clinician then asked group members to identify what currently gives them hope or what they believe could give them hope. Clinician utilized positive psychology principles to inform discussion. Clinician also confirmed details of specifics regarding [employer] testing with nurse manager and shared with patients, per their request.   Summary: Pt engaged in discussion. Pt reports, "utilizing the time I have left" gives her hope. She explains that unexpected losses and traumas have solidified her and others' mortality. She further explains that ambition and the possibility of continued success give her hope.    Session Time: 11:00 am - 12:00 pm  Participation Level: Active  Behavioral Response: CasualAlertAnxious and Depressed  Type of Therapy: Group Therapy  Treatment Goals addressed: Coping  Progress Towards Goals: Progressing  Interventions: CBT, DBT, Solution Focused, Strength-based, Supportive, and Reframing  Therapist Response: Clinician led discussion on anxiety. Clinician began with psychoeducation video explaining the brain's fear response and then discussion was held around the benefits of exposure therapy techniques to reduce anxiety responses. Clinician utilized CBT principles to inform discussion.  Summary: Pt was present and engaged for discussion.   Session Time: 12:00 pm - 1:00  pm  Participation Level: Active  Behavioral  Response: CasualAlertAnxious and Depressed  Type of Therapy: Group Therapy  Treatment Goals addressed: Coping  Progress Towards Goals: Progressing  Interventions: CBT, DBT, Solution Focused, Strength-based, Supportive, and Reframing  Therapist Response: 12:00 - 12:50 pm: See OT note. 12:50 - 1:00 pm: Clinician led check-out. Clinician assessed for immediate needs, medication compliance and efficacy, and safety concerns?  Summary: 12:00 - 12:50 pm: See OT note 12:50 - 1:00 pm: At check-out, patient contracts for safety.?Patient demonstrates progress as evidenced by her continued engagement and by being receptive to treatment. Patient denies SI/HI/self-harm thoughts at the end of group and agrees to seek help should those thoughts/feelings occur.?   Suicidal/Homicidal: Nowithout intent/plan  Plan: ?Pt will continue in PHP and medication management while continuing to work on decreasing depression symptoms,?SI, and anxiety symptoms,?and increasing the ability to self manage symptoms.   Collaboration of Care: Medication Management AEB Dr. Kai Levins  Patient/Guardian was advised Release of Information must be obtained prior to any record release in order to collaborate their care with an outside provider. Patient/Guardian was advised if they have not already done so to contact the registration department to sign all necessary forms in order for [name] to release information regarding their care.   Consent: Patient/Guardian gives verbal consent for treatment and assignment of benefits for services provided during this visit. Patient/Guardian expressed understanding and agreed to proceed.   Diagnosis: MDD (major depressive disorder), recurrent severe, without psychosis ([place]) [F33.2]    1. MDD (major depressive disorder), recurrent severe, without psychosis ([place])   2. GAD (generalized anxiety disorder)       Heron Nay, Latanya Presser 09/07/2022

## 2022-09-08 ENCOUNTER — Ambulatory Visit (HOSPITAL_COMMUNITY): Payer: Self-pay

## 2022-09-08 ENCOUNTER — Other Ambulatory Visit (HOSPITAL_COMMUNITY): Payer: Self-pay

## 2022-09-08 ENCOUNTER — Ambulatory Visit (INDEPENDENT_AMBULATORY_CARE_PROVIDER_SITE_OTHER): Payer: Medicaid Other | Admitting: Licensed Clinical Social Worker

## 2022-09-08 DIAGNOSIS — F332 Major depressive disorder, recurrent severe without psychotic features: Secondary | ICD-10-CM | POA: Diagnosis not present

## 2022-09-08 NOTE — Psych (Signed)
Virtual Visit via Video Note  I connected with Rhonda [name] on 09/08/22 at  9:35 AM EST by a video enabled telemedicine application and verified that I am speaking with the correct person using two identifiers.  Location: Patient: pt's home Provider: clinical home office   I discussed the limitations of evaluation and management by telemedicine and the availability of in person appointments. The patient expressed understanding and agreed to proceed.   I discussed the assessment and treatment plan with the patient. The patient was provided an opportunity to ask questions and all were answered. The patient agreed with the plan and demonstrated an understanding of the instructions.   The patient was advised to call back or seek an in-person evaluation if the symptoms worsen or if the condition fails to improve as anticipated.  I provided 240 minutes of non-face-to-face time during this encounter.   Rhonda [name]   [facility] PHP THERAPIST PROGRESS NOTE  Rhonda [name] [id number]   Session Time: 9:00 am - 10:00 am  Participation Level: Active  Behavioral Response: CasualAlertAnxious and Depressed  Type of Therapy: Group Therapy  Treatment Goals addressed: Coping  Progress Towards Goals: Progressing  Interventions: CBT, DBT, Solution Focused, Strength-based, Supportive, and Reframing  Therapist Response: Clinician led check-in regarding current stressors and situation, and review of patient completed daily inventory. Clinician utilized active listening and empathetic response and validated patient emotions. Clinician facilitated processing group on pertinent issues.?   Summary: Patient arrived within time allowed. Patient rates her mood at a 5 or 6 on a scale of 1-10 with 10 being best. Pt reports, "I just feel, I don't know. I can call it a blah. I'm just here." She reports she didn't get as much sleep last night, 4-5 hours. She reports she did not eat a full meal yesterday due  to decreased appetite. She states she slept and then ran an errand after group and then watched movies and chilled. Denies SI/SH thoughts. Pt states she does not have a particular topic she would like to discuss during processing. Pt able to process.?Pt engaged in discussion.?     Session Time: 10:00 am - 11:00 am  Participation Level: Active  Behavioral Response: CasualAlertAnxious and Depressed  Type of Therapy: Group Therapy  Treatment Goals addressed: Coping  Progress Towards Goals: Progressing  Interventions: CBT, DBT, Solution Focused, Strength-based, Supportive, and Reframing  Therapist Response: Group viewed Ted Talk entitled "How To Not Take Things Personally" presented by Nicanor Alcon. Cln utilized CBT principles to inform discussion while pts were encouraged to share their response to the video. Cln asked each patient to share a time in which they took things personally and reframed the situation based on what was shared in the [place] Talk.  Summary: Pt engaged in discussion. Pt reports she often takes it personally that her mother withholds important information about their family from her, but not from her younger sister. She demonstrates good insight into the subject matter.    Session Time: 11:00 am - 12:00 pm  Participation Level: Active  Behavioral Response: CasualAlertAnxious and Depressed  Type of Therapy: Group Therapy  Treatment Goals addressed: Coping  Progress Towards Goals: Progressing  Interventions: CBT, DBT, Solution Focused, Strength-based, Supportive, and Reframing  Therapist Response: See OT note.  Summary: See OT note.   Session Time: 12:00 pm - 1:00 pm  Participation Level: Active  Behavioral Response: CasualAlertAnxious and Depressed  Type of Therapy: Group Therapy, Spiritual Care  Treatment Goals addressed: Coping  Progress  Towards Goals: Progressing  Interventions: Supportive, Education  Therapist Response: 12:00 - 12:50 pm: [name], Chaplain, led group on self-care. 12:50 - 1:00 pm: Clinician led check-out. Clinician assessed for immediate needs, medication compliance and efficacy, and safety concerns?  Summary: 12:00 - 12:50 pm: Pt engaged in discussion. 12:50 - 1:00 pm: At check-out, patient contracts for safety.?Patient demonstrates progress as evidenced by her continued engagement and by being receptive to treatment. Patient denies SI/HI/self-harm thoughts at the end of group and agrees to seek help should those thoughts/feelings occur.?   Suicidal/Homicidal: Nowithout intent/plan  Plan: ?Pt will continue in PHP and medication management while continuing to work on decreasing depression symptoms,?SI, and anxiety symptoms,?and increasing the ability to self manage symptoms.    Collaboration of Care: Medication Management AEB Dr. Renaldo Fiddler  Patient/Guardian was advised Release of Information must be obtained prior to any record release in order to collaborate their care with an outside provider. Patient/Guardian was advised if they have not already done so to contact the registration department to sign all necessary forms in order for [name] to release information regarding their care.   Consent: Patient/Guardian gives verbal consent for treatment and assignment of benefits for services provided during this visit. Patient/Guardian expressed understanding and agreed to proceed.   Diagnosis: MDD (major depressive disorder), recurrent severe, without psychosis ([facility]) [F33.2]    1. MDD (major depressive disorder), recurrent severe, without psychosis ([facility])   2. GAD (generalized anxiety disorder)       Rhonda [name], Rhonda [name] 09/08/2022

## 2022-09-08 NOTE — Psych (Signed)
Virtual Visit via Video Note  I connected with [name] on 08/31/22 at  9:00 AM EST by a video enabled telemedicine application and verified that I am speaking with the correct person using two identifiers.  Location: Patient: patient home Provider: clinical home office   I discussed the limitations of evaluation and management by telemedicine and the availability of in person appointments. The patient expressed understanding and agreed to proceed.  I discussed the assessment and treatment plan with the patient. The patient was provided an opportunity to ask questions and all were answered. The patient agreed with the plan and demonstrated an understanding of the instructions.   The patient was advised to call back or seek an in-person evaluation if the symptoms worsen or if the condition fails to improve as anticipated.  Pt was provided 240 minutes of non-face-to-face time during this encounter.   Lorin Glass, LCSW   [facility] PHP THERAPIST PROGRESS NOTE  Mande Kolasinski [id number]  Session Time: 9:00 - 10:00  Participation Level: Active  Behavioral Response: CasualAlertDepressed  Type of Therapy: Group Therapy  Treatment Goals addressed: Coping  Progress Towards Goals: Initial  Interventions: CBT, DBT, Supportive, and Reframing  Summary: Rhonda [name] is a 31 y.o. female who presents with depression symptoms.  Clinician led check-in regarding current stressors and situation, and review of patient completed daily inventory. Clinician utilized active listening and empathetic response and validated patient emotions. Clinician facilitated processing group on pertinent issues.?   Therapist Response: Patient arrived within time allowed. Patient rates her mood at a 6 on a scale of 1-10 with 10 being best. Pt states she feels "okay." Pt reports she has been up for a while and active. Pt reports yesterday was "frustrating" and she felt overwhelmed. Pt reports passive SI and denies plan  or intent. Patient able to process. Patient engaged in discussion.       Session Time: 10:00 am - 11:00 am   Participation Level: Active   Behavioral Response: CasualAlertDepressed   Type of Therapy: Group Therapy   Treatment Goals addressed: Coping   Progress Towards Goals: Progressing   Interventions: CBT, DBT, Solution Focused, Strength-based, Supportive, and Reframing   Therapist Response: Cln led discussion on support groups and the role they can provide in recovery and ongoing treatment. Group discussed success and barriers they have had with past support groups or they have with considering support groups. Cln provided resources for local support groups.    Therapist Response:  Pt engaged in discussion and reports interest in engaging in support groups.          Session Time: 11:00 -12:00   Participation Level: Active   Behavioral Response: CasualAlertDepressed   Type of Therapy: Group Therapy, Spiritual Care   Treatment Goals addressed: Coping   Progress Towards Goals: Progressing   Interventions: Supportive, Education   Summary:  [name], Chaplain, led group.   Therapist Response: Pt participated         Session Time: 12:00 -1:00   Participation Level: Active   Behavioral Response: CasualAlertDepressed   Type of Therapy: Group therapy, Occupational Therapy   Treatment Goals addressed: Coping   Progress Towards Goals: Progressing   Interventions: Supportive; Psychoeducation   Summary: 12:00 - 12:50: Occupational Therapy group led by cln [name]. 12:50 - 1:00 Clinician assessed for immediate needs, medication compliance and efficacy, and safety concerns.   Therapist Response: 12:00 - 12:50: See OT note 12:50 - 1:00 pm: At check-out, patient reports no immediate concerns. Patient  demonstrates progress as evidenced by participating in first group session. Patient denies SI/HI/self-harm thoughts at the end of group.    Suicidal/Homicidal:  Nowithout intent/plan  Plan: Pt will continue in PHP while working to decrease depression symptoms, increase support, and increase ability to manage symptoms in a healthy manner.   Collaboration of Care: Medication Management AEB A Pashayan  Patient/Guardian was advised Release of Information must be obtained prior to any record release in order to collaborate their care with an outside provider. Patient/Guardian was advised if they have not already done so to contact the registration department to sign all necessary forms in order for [name] to release information regarding their care.   Consent: Patient/Guardian gives verbal consent for treatment and assignment of benefits for services provided during this visit. Patient/Guardian expressed understanding and agreed to proceed.   Diagnosis: MDD (major depressive disorder), recurrent severe, without psychosis ([facility]) [F33.2]    1. MDD (major depressive disorder), recurrent severe, without psychosis ([facility])   2. Generalized anxiety disorder      Rhonda Guiles, LCSW

## 2022-09-09 ENCOUNTER — Ambulatory Visit (HOSPITAL_COMMUNITY): Payer: Self-pay

## 2022-09-09 ENCOUNTER — Ambulatory Visit (INDEPENDENT_AMBULATORY_CARE_PROVIDER_SITE_OTHER): Payer: Medicaid Other | Admitting: Licensed Clinical Social Worker

## 2022-09-09 ENCOUNTER — Other Ambulatory Visit (HOSPITAL_COMMUNITY): Payer: Self-pay

## 2022-09-09 DIAGNOSIS — F332 Major depressive disorder, recurrent severe without psychotic features: Secondary | ICD-10-CM | POA: Diagnosis not present

## 2022-09-09 DIAGNOSIS — F411 Generalized anxiety disorder: Secondary | ICD-10-CM

## 2022-09-12 ENCOUNTER — Ambulatory Visit (INDEPENDENT_AMBULATORY_CARE_PROVIDER_SITE_OTHER): Payer: Medicaid Other | Admitting: Licensed Clinical Social Worker

## 2022-09-12 ENCOUNTER — Other Ambulatory Visit (HOSPITAL_COMMUNITY): Payer: Self-pay

## 2022-09-12 ENCOUNTER — Ambulatory Visit (HOSPITAL_COMMUNITY): Payer: Self-pay

## 2022-09-12 DIAGNOSIS — F411 Generalized anxiety disorder: Secondary | ICD-10-CM

## 2022-09-12 DIAGNOSIS — F332 Major depressive disorder, recurrent severe without psychotic features: Secondary | ICD-10-CM | POA: Diagnosis not present

## 2022-09-12 NOTE — Psych (Signed)
Virtual Visit via Video Note  I connected with Rhonda [name] on 09/01/22 at  9:35 AM EST by a video enabled telemedicine application and verified that I am speaking with the correct person using two identifiers.  Location: Patient: patient home Provider: clinical home office   I discussed the limitations of evaluation and management by telemedicine and the availability of in person appointments. The patient expressed understanding and agreed to proceed.  I discussed the assessment and treatment plan with the patient. The patient was provided an opportunity to ask questions and all were answered. The patient agreed with the plan and demonstrated an understanding of the instructions.   The patient was advised to call back or seek an in-person evaluation if the symptoms worsen or if the condition fails to improve as anticipated.  Pt was provided 240 minutes of non-face-to-face time during this encounter.   [name], LCSW   [facility] PHP THERAPIST PROGRESS NOTE  Rhonda [name] [id number]  Session Time: 9:00 - 10:00  Participation Level: Active  Behavioral Response: CasualAlertDepressed  Type of Therapy: Group Therapy  Treatment Goals addressed: Coping  Progress Towards Goals: Initial  Interventions: CBT, DBT, Supportive, and Reframing  Summary: Rhonda [name] is a 31 y.o. female who presents with depression symptoms.  Clinician led check-in regarding current stressors and situation, and review of patient completed daily inventory. Clinician utilized active listening and empathetic response and validated patient emotions. Clinician facilitated processing group on pertinent issues.?   Therapist Response: Patient arrived within time allowed. Patient rates her mood at a 5 on a scale of 1-10 with 10 being best. Pt states she feels "blah." Pt reports a disagreement took her off guard yesterday and she drank to manage her feelings. Pt reports being upset that she handled her feelings with  alcohol.  Patient able to process. Patient engaged in discussion.       Session Time: 10:00 am - 11:00 am   Participation Level: Active   Behavioral Response: CasualAlertDepressed   Type of Therapy: Group Therapy   Treatment Goals addressed: Coping   Progress Towards Goals: Progressing   Interventions: CBT, DBT, Solution Focused, Strength-based, Supportive, and Reframing   Therapist Response: Cln led discussion on trust and how to establish healthy trust. Group discussed common missteps such as disclosing personal information too soon, ignoring behaviors being displayed, inaccurately defining the relationship, and not giving people credit. Cln utilized CBT thought challenging principles to encourage pt's to rely on "evidence" versus feelings. Group members shared struggles they experience with trust.   Therapist Response:  Pt engaged in discussion and reports struggling with trust.         Session Time: 11:00 -12:00   Participation Level: Active   Behavioral Response: CasualAlertDepressed   Type of Therapy: Group Therapy   Treatment Goals addressed: Coping   Progress Towards Goals: Progressing   Interventions: CBT, DBT, Solution Focused, Strength-based, Supportive, and Reframing   Summary: Cln led discussion on feelings and the role they play for our lives. Cln contextualized feelings as a warning system that brings attention to areas of our lives that need focus. Group members discussed how to pay attention to what the feeling is telling us versus reacting to unpleasant aspects of a feeling.    Therapist Response: Pt engaged in discussion and reports understanding.          Session Time: 12:00 -1:00   Participation Level: Active   Behavioral Response: CasualAlertDepressed   Type of Therapy: Group therapy, Occupational Therapy  Treatment Goals addressed: Coping   Progress Towards Goals: Progressing   Interventions: Supportive; Psychoeducation   Summary:  12:00 - 12:50: Occupational Therapy group led by cln [name]. 12:50 - 1:00 Clinician assessed for immediate needs, medication compliance and efficacy, and safety concerns.   Therapist Response: 12:00 - 12:50: Pt participated 12:50 - 1:00 pm: At check-out, patient reports no immediate concerns. Patient demonstrates progress as evidenced by identifying areas to work on. Patient denies SI/HI/self-harm thoughts at the end of group.    Suicidal/Homicidal: Nowithout intent/plan  Plan: Pt will continue in PHP while working to decrease depression symptoms, increase support, and increase ability to manage symptoms in a healthy manner.   Collaboration of Care: Medication Management AEB A Pashayan  Patient/Guardian was advised Release of Information must be obtained prior to any record release in order to collaborate their care with an outside provider. Patient/Guardian was advised if they have not already done so to contact the registration department to sign all necessary forms in order for [name] to release information regarding their care.   Consent: Patient/Guardian gives verbal consent for treatment and assignment of benefits for services provided during this visit. Patient/Guardian expressed understanding and agreed to proceed.   Diagnosis: MDD (major depressive disorder), recurrent severe, without psychosis ([facility]) [F33.2]    1. MDD (major depressive disorder), recurrent severe, without psychosis ([facility])   2. Generalized anxiety disorder   3. Insomnia, unspecified type      [name], LCSW

## 2022-09-13 ENCOUNTER — Encounter (HOSPITAL_COMMUNITY): Payer: Self-pay

## 2022-09-13 ENCOUNTER — Telehealth (HOSPITAL_COMMUNITY): Payer: Self-pay | Admitting: Licensed Clinical Social Worker

## 2022-09-13 ENCOUNTER — Ambulatory Visit (INDEPENDENT_AMBULATORY_CARE_PROVIDER_SITE_OTHER): Payer: Medicaid Other | Admitting: Licensed Clinical Social Worker

## 2022-09-13 ENCOUNTER — Other Ambulatory Visit (HOSPITAL_COMMUNITY): Payer: Self-pay

## 2022-09-13 ENCOUNTER — Ambulatory Visit (HOSPITAL_COMMUNITY): Payer: Self-pay

## 2022-09-13 DIAGNOSIS — G4700 Insomnia, unspecified: Secondary | ICD-10-CM

## 2022-09-13 DIAGNOSIS — F332 Major depressive disorder, recurrent severe without psychotic features: Secondary | ICD-10-CM | POA: Diagnosis not present

## 2022-09-13 DIAGNOSIS — F411 Generalized anxiety disorder: Secondary | ICD-10-CM

## 2022-09-13 MED ORDER — ESCITALOPRAM OXALATE 10 MG PO TABS: 10.0000 mg | ORAL_TABLET | Freq: Every day | ORAL | 0 refills | Status: DC

## 2022-09-13 NOTE — Progress Notes (Signed)
BH MD/PA/NP PHP Progress Note   Virtual Visit via Video Note  I connected with Rhonda [name] on 09/13/22 at  9:35 AM EST by a video enabled telemedicine application and verified that I am speaking with the correct person using two identifiers.  Location: Patient: Home Provider: [facility]   I discussed the limitations of evaluation and management by telemedicine and the availability of in person appointments. The patient expressed understanding and agreed to proceed.   09/13/2022 10:49 AM Rhonda [name]  MRN:  [id number]  Chief Complaint:  Chief Complaint  Patient presents with   Follow-up   Depression   Anxiety   HPI:  Rhonda [name] is a 31 yr old female who presents via Virtual Video Visit for follow up and for medication management, she enrolled in the [facility] Program 08/31/2022.  PPHx is significant for Depression, Anxiety, and PTSD, and Multiple Suicide Attempts (~3 OD's and 2020 held gun to head and pulled trigger), and no history of Self Injurious Behavior or Psychiatric Hospitalizations.     She reports that she is doing okay today.  She reports that the program has been helpful in helping her understand herself and understand her feelings/emotions.  She reports that she is still having panic attacks.  She reports that the trazodone has been helpful in helping her get to sleep but she still not staying asleep as long as she would like.  When asked if she is having any side effects to her medications she reports that she feels like there have been a few episodes where her lip has gone numb but otherwise reports no other issues at present.  Discussed further increasing her Lexapro to help address her anxiety and depression and she was agreeable to this.  Discussed with her that we would keep her trazodone at its current dose since it has been helpful and as her depression and anxiety improve her sleep should continue to improve as well.  She was agreeable with this and had no other questions at  present.  She reports no SI, HI, or AVH.  She reports her sleep is fair.  She reports her appetite is fair.  She reports that she does currently have a headache but reports no other concerns at present.   Visit Diagnosis:    ICD-10-CM   1. MDD (major depressive disorder), recurrent severe, without psychosis (HCC)  F33.2 escitalopram (LEXAPRO) 10 MG tablet    2. Generalized anxiety disorder  F41.1 escitalopram (LEXAPRO) 10 MG tablet    3. Insomnia, unspecified type  G47.00       Past Psychiatric History: Depression, Anxiety, and PTSD, and Multiple Suicide Attempts (~3 OD's and 2020 held gun to head and pulled trigger), and no history of Self Injurious Behavior or Psychiatric Hospitalizations.    Past Medical History:  Past Medical History:  Diagnosis Date   Anxiety    Depression    Medical history non-contributory    PTSD (post-traumatic stress disorder)    Shoulder injury     Past Surgical History:  Procedure Laterality Date   SHOULDER ARTHROSCOPY WITH LABRAL REPAIR Left 11/11/2021   Procedure: Left SHOULDER ARTHROSCOPY WITH LABRAL REPAIR;  Surgeon: Huel Cote, MD;  Location: [facility];  Service: Orthopedics;  Laterality: Left;   TONSILLECTOMY     wisdom teeth      Family Psychiatric History: Maternal Family- multiple members with diagnosis'- Depression, Anxiety, PTSD, Schizophrenia, Bipolar Disorder, ADHD Father- EtOH Abuse Maternal Aunt- Suicide via Hanging 1993  Family History:  Family History  Problem Relation Age of Onset   Alcohol abuse Father    Depression Maternal Aunt    Bipolar disorder Maternal Aunt    Bipolar disorder Maternal Uncle    Depression Maternal Uncle    Hypertension Other     Social History:  Social History   Socioeconomic History   Marital status: Single    Spouse name: Not on file   Number of children: 0   Years of education: Not on file   Highest education level: Some college, no degree  Occupational History   Not  on file  Tobacco Use   Smoking status: Former    Types: Cigarettes    Quit date: 09/30/2012    Years since quitting: 9.9   Smokeless tobacco: Never  Vaping Use   Vaping Use: Never used  Substance and Sexual Activity   Alcohol use: Yes    Comment: social drinker, past abuse   Drug use: Not Currently   Sexual activity: Yes    Birth control/protection: None  Other Topics Concern   Not on file  Social History Narrative   Not on file   Social Determinants of Health   Financial Resource Strain: Not on file  Food Insecurity: Not on file  Transportation Needs: Not on file  Physical Activity: Not on file  Stress: Not on file  Social Connections: Not on file    Allergies: No Known Allergies  Metabolic Disorder Labs: Lab Results  Component Value Date   HGBA1C 5.2 01/07/2022   MPG 102.54 01/07/2022   No results found for: "PROLACTIN" Lab Results  Component Value Date   CHOL 185 01/07/2022   TRIG 204 (H) 01/07/2022   HDL 40 (L) 01/07/2022   CHOLHDL 4.6 01/07/2022   VLDL 41 (H) 01/07/2022   LDLCALC 104 (H) 01/07/2022   Lab Results  Component Value Date   TSH 1.886 01/07/2022   TSH 2.589 10/10/2018    Therapeutic Level Labs: No results found for: "LITHIUM" No results found for: "VALPROATE" No results found for: "CBMZ"  Current Medications: Current Outpatient Medications  Medication Sig Dispense Refill   escitalopram (LEXAPRO) 10 MG tablet Take 1 tablet (10 mg total) by mouth daily. 30 tablet 0   traZODone (DESYREL) 50 MG tablet Take 1 tablet (50 mg total) by mouth at bedtime. 30 tablet 0   No current facility-administered medications for this visit.     Musculoskeletal: Strength & Muscle Tone: within normal limits Gait & Station:  laying down during interview Patient leans: N/A  Psychiatric Specialty Exam: Review of Systems  Respiratory:  Negative for shortness of breath.   Cardiovascular:  Negative for chest pain.  Gastrointestinal:  Negative for  abdominal pain, constipation, diarrhea, nausea and vomiting.  Neurological:  Positive for headaches. Negative for dizziness and weakness.  Psychiatric/Behavioral:  Positive for dysphoric mood and sleep disturbance. Negative for hallucinations and suicidal ideas. The patient is not nervous/anxious.     unknown if currently breastfeeding.There is no height or weight on file to calculate BMI.  General Appearance: Casual and Fairly Groomed  Eye Contact:  Fair  Speech:  Clear and Coherent and Normal Rate  Volume:  Normal  Mood:  Anxious and Dysphoric  Affect:  Congruent  Thought Process:  Coherent and Goal Directed  Orientation:  Full (Time, Place, and Person)  Thought Content: WDL and Logical   Suicidal Thoughts:  No  Homicidal Thoughts:  No  Memory:  Immediate;   Good Recent;   Good  Judgement:  Good  Insight:  Good  Psychomotor Activity:  Normal  Concentration:  Concentration: Good and Attention Span: Good  Recall:  Good  Fund of Knowledge: Good  Language: Good  Akathisia:  Negative  Handed:  Right  AIMS (if indicated): not done  Assets:  Communication Skills Desire for Improvement Physical Health Resilience  ADL's:  Intact  Cognition: WNL  Sleep:  Fair   Screenings: Insurance account manager from 09/05/2022 in [facility] Counselor from 01/21/2022 in BEHAVIORAL [facility] PARTIAL [facility]OSPITALIZATION PROGRAM  PHQ-2 Total Score 6 5  PHQ-9 Total Score 25 20      Flowsheet Row Counselor from 09/05/2022 in [facility] Counselor from 01/21/2022 in BEHAVIORAL HEALTH PARTIAL HOSPITALIZATION PROGRAM ED from 01/07/2022 in [facility]  C-SSRS RISK CATEGORY High Risk Error: Q3, 4, or 5 should not be populated when Q2 is No Low Risk        Assessment and Plan:  Rhonda [name] is a 31 yr old female who presents via Virtual Video Visit for follow up and for medication management, she enrolled in the [facility]  Program 08/31/2022.  PPHx is significant for Depression, Anxiety, and PTSD, and Multiple Suicide Attempts (~3 OD's and 2020 held gun to head and pulled trigger), and no history of Self Injurious Behavior or Psychiatric Hospitalizations.    Rhonda [name] has had a mixed result with her medications so far.  Her Trazodone has improved her sleep but she is continuing to have panic attacks.  We will further increase her Lexapro at this time.  We will continue to monitor.    MDD, Recurrent, Severe, w/out Psychosis  GAD  PTSD: -Increase Lexapro 10 mg daily for depression and anxiety.  30 tablets with 0 refills.     Insomnia: -Continue Trazodone 50 mg QHS.  No refills sent at this time.   Collaboration of Care: Collaboration of Care: Other PHP Program  Patient/Guardian was advised Release of Information must be obtained prior to any record release in order to collaborate their care with an outside provider. Patient/Guardian was advised if they have not already done so to contact the registration department to sign all necessary forms in order for [name] to release information regarding their care.   Consent: Patient/Guardian gives verbal consent for treatment and assignment of benefits for services provided during this visit. Patient/Guardian expressed understanding and agreed to proceed.    Lauro Franklin, MD 09/13/2022, 10:49 AM    Follow Up Instructions:    I discussed the assessment and treatment plan with the patient. The patient was provided an opportunity to ask questions and all were answered. The patient agreed with the plan and demonstrated an understanding of the instructions.   The patient was advised to call back or seek an in-person evaluation if the symptoms worsen or if the condition fails to improve as anticipated.  I provided 15 minutes of non-face-to-face time during this encounter.   Lauro Franklin, MD

## 2022-09-13 NOTE — Progress Notes (Signed)
Spoke with patient via WebEx video call, used 2 identifiers to correctly identify patient. States that groups are going well. Has had an increase in her Lexapro to 10mg  daily since it was not helping at all. Today is the first day of the increase in dosage. Her panic attacks had become more frequent. She has noticed that her bottom lip is going numb and she did mention it to the MD. They are keeping an eye on it to see if it worsens or improves. On scale 1-10 as 10 being worst she rates depression at 7/8 and anxiety at 9. Denies SI/HI or AV hallucinations.  No issues or complaints.

## 2022-09-14 ENCOUNTER — Ambulatory Visit (INDEPENDENT_AMBULATORY_CARE_PROVIDER_SITE_OTHER): Payer: Medicaid Other | Admitting: Licensed Clinical Social Worker

## 2022-09-14 ENCOUNTER — Ambulatory Visit (HOSPITAL_COMMUNITY): Payer: Self-pay

## 2022-09-14 ENCOUNTER — Other Ambulatory Visit (HOSPITAL_COMMUNITY): Payer: Self-pay

## 2022-09-14 DIAGNOSIS — F411 Generalized anxiety disorder: Secondary | ICD-10-CM

## 2022-09-14 DIAGNOSIS — F332 Major depressive disorder, recurrent severe without psychotic features: Secondary | ICD-10-CM

## 2022-09-15 ENCOUNTER — Other Ambulatory Visit (HOSPITAL_COMMUNITY): Payer: Self-pay

## 2022-09-15 ENCOUNTER — Ambulatory Visit (HOSPITAL_COMMUNITY): Payer: Self-pay

## 2022-09-15 ENCOUNTER — Ambulatory Visit (INDEPENDENT_AMBULATORY_CARE_PROVIDER_SITE_OTHER): Payer: Medicaid Other | Admitting: Professional

## 2022-09-15 DIAGNOSIS — F332 Major depressive disorder, recurrent severe without psychotic features: Secondary | ICD-10-CM | POA: Diagnosis not present

## 2022-09-15 DIAGNOSIS — F411 Generalized anxiety disorder: Secondary | ICD-10-CM

## 2022-09-15 MED ORDER — SERTRALINE HCL 50 MG PO TABS: 50.0000 mg | ORAL_TABLET | Freq: Every day | ORAL | 0 refills | Status: DC

## 2022-09-15 MED ORDER — HYDROXYZINE HCL 25 MG PO TABS: 25.0000 mg | ORAL_TABLET | Freq: Three times a day (TID) | ORAL | 0 refills | Status: DC | PRN

## 2022-09-15 NOTE — Progress Notes (Signed)
BH MD/PA/NP PHP Progress Note   Virtual Visit via Video Note  I connected with Rhonda [name] on 09/15/22 at  9:35 AM EST by a video enabled telemedicine application and verified that I am speaking with the correct person using two identifiers.  Location: Patient: Home Provider: [facility]   I discussed the limitations of evaluation and management by telemedicine and the availability of in person appointments. The patient expressed understanding and agreed to proceed.   09/15/2022 10:38 AM Rhonda [name]  MRN:  [id number]  Chief Complaint:  Chief Complaint  Patient presents with   Follow-up   Medication Reaction   Anxiety   Depression   HPI:  Rhonda [name] is a 31 yr old female who presents via Virtual Video Visit for follow up and for medication management, she enrolled in the [facility] Program 08/31/2022.  PPHx is significant for Depression, Anxiety, and PTSD, and Multiple Suicide Attempts (~3 OD's and 2020 held gun to head and pulled trigger), and no history of Self Injurious Behavior or Psychiatric Hospitalizations.   She reports that she has not done well since our last appointment and the increase in Lexapro.  She reports that she feels like she has been holding on the edge of her sleep the last few days.  She reports that her anxiety has been significantly worse and she has been having panic attacks.  Discussed with her that given this response to the increase in Lexapro we should try switching to Zoloft and she was agreeable with this.  Also discussed square breathing with her as a way to counteract her anxiety.  Discussed hydroxyzine and that it can help with acute increases in panic if she would like to trial it and she said she would.  She reports no SI, HI, or AVH.  She reports her sleep has been poor due to increased anxiety.  She reports her appetite has been poor due to nausea from her increased anxiety.  She reports she has had nausea but reports no other concerns at present.    Visit  Diagnosis:    ICD-10-CM   1. Major depressive disorder, recurrent episode, severe with anxious distress (HCC)  F33.2 sertraline (ZOLOFT) 50 MG tablet    2. Generalized anxiety disorder  F41.1 sertraline (ZOLOFT) 50 MG tablet    hydrOXYzine (ATARAX) 25 MG tablet      Past Psychiatric History: Depression, Anxiety, and PTSD, and Multiple Suicide Attempts (~3 OD's and 2020 held gun to head and pulled trigger), and no history of Self Injurious Behavior or Psychiatric Hospitalizations.     Past Medical History:  Past Medical History:  Diagnosis Date   Anxiety    Depression    Medical history non-contributory    PTSD (post-traumatic stress disorder)    Shoulder injury     Past Surgical History:  Procedure Laterality Date   SHOULDER ARTHROSCOPY WITH LABRAL REPAIR Left 11/11/2021   Procedure: Left SHOULDER ARTHROSCOPY WITH LABRAL REPAIR;  Surgeon: Huel Cote, MD;  Location: [facility];  Service: Orthopedics;  Laterality: Left;   TONSILLECTOMY     wisdom teeth      Family Psychiatric History: Maternal Family- multiple members with diagnosis'- Depression, Anxiety, PTSD, Schizophrenia, Bipolar Disorder, ADHD Father- EtOH Abuse Maternal Aunt- Suicide via Hanging 1993  Family History:  Family History  Problem Relation Age of Onset   Alcohol abuse Father    Depression Maternal Aunt    Bipolar disorder Maternal Aunt    Bipolar disorder Maternal Uncle  Depression Maternal Uncle    Hypertension Other     Social History:  Social History   Socioeconomic History   Marital status: Single    Spouse name: Not on file   Number of children: 0   Years of education: Not on file   Highest education level: Some college, no degree  Occupational History   Not on file  Tobacco Use   Smoking status: Former    Types: Cigarettes    Quit date: 09/30/2012    Years since quitting: 9.9   Smokeless tobacco: Never  Vaping Use   Vaping Use: Never used  Substance and Sexual  Activity   Alcohol use: Yes    Comment: social drinker, past abuse   Drug use: Not Currently   Sexual activity: Yes    Birth control/protection: None  Other Topics Concern   Not on file  Social History Narrative   Not on file   Social Determinants of Health   Financial Resource Strain: Not on file  Food Insecurity: Not on file  Transportation Needs: Not on file  Physical Activity: Not on file  Stress: Not on file  Social Connections: Not on file    Allergies: No Known Allergies  Metabolic Disorder Labs: Lab Results  Component Value Date   HGBA1C 5.2 01/07/2022   MPG 102.54 01/07/2022   No results found for: "PROLACTIN" Lab Results  Component Value Date   CHOL 185 01/07/2022   TRIG 204 (H) 01/07/2022   HDL 40 (L) 01/07/2022   CHOLHDL 4.6 01/07/2022   VLDL 41 (H) 01/07/2022   LDLCALC 104 (H) 01/07/2022   Lab Results  Component Value Date   TSH 1.886 01/07/2022   TSH 2.589 10/10/2018    Therapeutic Level Labs: No results found for: "LITHIUM" No results found for: "VALPROATE" No results found for: "CBMZ"  Current Medications: Current Outpatient Medications  Medication Sig Dispense Refill   hydrOXYzine (ATARAX) 25 MG tablet Take 1 tablet (25 mg total) by mouth 3 (three) times daily as needed. 30 tablet 0   sertraline (ZOLOFT) 50 MG tablet Take 1 tablet (50 mg total) by mouth daily. 30 tablet 0   traZODone (DESYREL) 50 MG tablet Take 1 tablet (50 mg total) by mouth at bedtime. 30 tablet 0   No current facility-administered medications for this visit.     Musculoskeletal: Strength & Muscle Tone: within normal limits Gait & Station:  laying in bed Patient leans: N/A  Psychiatric Specialty Exam: Review of Systems  Respiratory:  Negative for shortness of breath.   Cardiovascular:  Negative for chest pain.  Gastrointestinal:  Positive for nausea. Negative for abdominal pain, constipation, diarrhea and vomiting.  Neurological:  Negative for dizziness,  weakness and headaches.  Psychiatric/Behavioral:  Positive for dysphoric mood and sleep disturbance. Negative for hallucinations and suicidal ideas. The patient is nervous/anxious.     unknown if currently breastfeeding.There is no height or weight on file to calculate BMI.  General Appearance: Casual and Fairly Groomed  Eye Contact:  Fair  Speech:  Clear and Coherent and Normal Rate  Volume:  Normal  Mood:  Anxious and Depressed  Affect:  Congruent and Constricted  Thought Process:  Coherent and Goal Directed  Orientation:  Full (Time, Place, and Person)  Thought Content: WDL and Logical   Suicidal Thoughts:  No  Homicidal Thoughts:  No  Memory:  Immediate;   Good Recent;   Good  Judgement:  Good  Insight:  Good  Psychomotor Activity:  Normal  Concentration:  Concentration: Good and Attention Span: Good  Recall:  Good  Fund of Knowledge: Good  Language: Good  Akathisia:  Negative  Handed:  Right  AIMS (if indicated): not done  Assets:  Communication Skills Desire for Improvement Physical Health Resilience  ADL's:  Intact  Cognition: WNL  Sleep:  Poor   Screenings: Insurance account manager from 09/05/2022 in [facility] Counselor from 01/21/2022 in BEHAVIORAL HEALTH PARTIAL HOSPITALIZATION PROGRAM  PHQ-2 Total Score 6 5  PHQ-9 Total Score 25 20      Flowsheet Row Counselor from 09/05/2022 in [facility] Counselor from 01/21/2022 in BEHAVIORAL HEALTH PARTIAL HOSPITALIZATION PROGRAM ED from 01/07/2022 in [facility]  C-SSRS RISK CATEGORY High Risk Error: Q3, 4, or 5 should not be populated when Q2 is No Low Risk        Assessment and Plan:  Rhonda [name] is a 31 yr old female who presents via Virtual Video Visit for follow up and for medication management, she enrolled in the [facility] Program 08/31/2022.  PPHx is significant for Depression, Anxiety, and PTSD, and Multiple Suicide  Attempts (~3 OD's and 2020 held gun to head and pulled trigger), and no history of Self Injurious Behavior or Psychiatric Hospitalizations.    Rhonda [name]'s anxiety has been acutely worse the last 2 days since the increase in her Lexapro.  Due to this we will discontinue Lexapro and start Zoloft 25 mg for 4 days then increase to 50 mg daily afterwards.  We will also start hydroxyzine for acute anxiety.  Taught square breathing and encouraged its use during acute anxiety.  We will continue to monitor.   MDD, Recurrent, Severe, w/out Psychosis  GAD  PTSD: -Start Zoloft 25 mg daily for 4 days then increase to 50 mg daily for depression and anxiety.  30 tablets with 0 refills. -Start Hydroxyzine 25 mg TID PRN for anxiety.  30 tablets with 0 refills.     Insomnia: -Continue Trazodone 50 mg QHS.  No refills sent at this time.   Collaboration of Care: Collaboration of Care: Other PHP Program  Patient/Guardian was advised Release of Information must be obtained prior to any record release in order to collaborate their care with an outside provider. Patient/Guardian was advised if they have not already done so to contact the registration department to sign all necessary forms in order for [name] to release information regarding their care.   Consent: Patient/Guardian gives verbal consent for treatment and assignment of benefits for services provided during this visit. Patient/Guardian expressed understanding and agreed to proceed.    Lauro Franklin, MD 09/15/2022, 10:38 AM   Follow Up Instructions:    I discussed the assessment and treatment plan with the patient. The patient was provided an opportunity to ask questions and all were answered. The patient agreed with the plan and demonstrated an understanding of the instructions.   The patient was advised to call back or seek an in-person evaluation if the symptoms worsen or if the condition fails to improve as anticipated.  I provided 15 minutes  of non-face-to-face time during this encounter.   Lauro Franklin, MD

## 2022-09-16 ENCOUNTER — Ambulatory Visit (INDEPENDENT_AMBULATORY_CARE_PROVIDER_SITE_OTHER): Payer: Medicaid Other | Admitting: Professional

## 2022-09-16 ENCOUNTER — Other Ambulatory Visit (HOSPITAL_COMMUNITY): Payer: Self-pay

## 2022-09-16 ENCOUNTER — Encounter (HOSPITAL_COMMUNITY): Payer: Self-pay | Admitting: Licensed Clinical Social Worker

## 2022-09-16 ENCOUNTER — Ambulatory Visit (HOSPITAL_COMMUNITY): Payer: Self-pay

## 2022-09-16 DIAGNOSIS — F332 Major depressive disorder, recurrent severe without psychotic features: Secondary | ICD-10-CM | POA: Diagnosis not present

## 2022-09-16 DIAGNOSIS — F411 Generalized anxiety disorder: Secondary | ICD-10-CM

## 2022-09-19 ENCOUNTER — Ambulatory Visit (HOSPITAL_COMMUNITY): Payer: Self-pay

## 2022-09-19 ENCOUNTER — Ambulatory Visit (INDEPENDENT_AMBULATORY_CARE_PROVIDER_SITE_OTHER): Payer: Medicaid Other | Admitting: Licensed Clinical Social Worker

## 2022-09-19 ENCOUNTER — Other Ambulatory Visit (HOSPITAL_COMMUNITY): Payer: Self-pay

## 2022-09-19 DIAGNOSIS — F332 Major depressive disorder, recurrent severe without psychotic features: Secondary | ICD-10-CM | POA: Diagnosis not present

## 2022-09-19 NOTE — Progress Notes (Unsigned)
Spoke with patient via WebEx video call, used 2 identifiers to correctly identify patient. Had a very hard weekend. Had a fight with a friend she was in a relationship with and they blocked her. She was crying and upset feeling like no one understands her. She recently switched from Lexapro to Zoloft because Lexapro was making her worse. Has not had a change yet but she doesn't feel worse. Her last day of PHP is tomorrow and she will go back to her therapist. On scale 1-10 as 10 being worst she rates depression at 9 and anxiety at 10. Denies SI/HI or AV hallucinations. No issues or complaints.

## 2022-09-19 NOTE — Psych (Signed)
Virtual Visit via Video Note  I connected with Rhonda [name] on 09/16/22 at  9:35 AM EST by a video enabled telemedicine application and verified that I am speaking with the correct person using two identifiers.  Location: Patient: patient home Provider: clinical home office   I discussed the limitations of evaluation and management by telemedicine and the availability of in person appointments. The patient expressed understanding and agreed to proceed.  I discussed the assessment and treatment plan with the patient. The patient was provided an opportunity to ask questions and all were answered. The patient agreed with the plan and demonstrated an understanding of the instructions.   The patient was advised to call back or seek an in-person evaluation if the symptoms worsen or if the condition fails to improve as anticipated.  Pt was provided 240 minutes of non-face-to-face time during this encounter.   Quinn Axe, [facility]   [facility] PHP THERAPIST PROGRESS NOTE  Jessicah Croll [id number]  Session Time: 9:00 - 10:00  Participation Level: Active  Behavioral Response: CasualAlertDepressed  Type of Therapy: Group Therapy  Treatment Goals addressed: Coping  Progress Towards Goals: Initial  Interventions: CBT, DBT, Supportive, and Reframing  Summary: Clinician led check-in regarding current stressors and situation, and review of patient completed daily inventory. Clinician utilized active listening and empathetic response and validated patient emotions. Clinician facilitated processing group on pertinent issues.?   Therapist Response: Rhonda [name] is a 31 y.o. female who presents with depression symptoms. Patient arrived within time allowed. Patient rates her mood at a 5 on a scale of 1-10 with 10 being best. Pt states she feels "ok." Pt reports her anxiety and depression are a 5 on a scale of 1-low, 10-high. Pt reports continued decrease in appetite and OK sleep. Pt shares she was  unable to attend her class last night due to the office being closed unexpectedly. Pt reports she had a good evening, ate dinner, and went to bed early. Pt woke at 3a this morning and started being productive at home due to anxiety. Pt was able to take prn med to help decrease anxiety. Patient able to process. Patient engaged in discussion.     Session Time: 10:00 am - 11:00 am   Participation Level: Active   Behavioral Response: CasualAlertDepressed   Type of Therapy: Group Therapy   Treatment Goals addressed: Coping   Progress Towards Goals: Progressing   Interventions: CBT, DBT, Solution Focused, Strength-based, Supportive, and Reframing   Therapist Response: Clinician introduced "Mindfulness". Clinician showed TedTalk on mindfulness and the group discussed. Patients identified what types of activities would help them practice mindfulness.    Therapist Response:  Pt engaged in discussion.  Pt is able to identify activities she does on "autopilot" that will provide opportunities for pt to "check in" and be mindful.     Session Time: 11:00 -12:00   Participation Level: Active   Behavioral Response: CasualAlertDepressed   Type of Therapy: Group Therapy   Treatment Goals addressed: Coping   Progress Towards Goals: Progressing   Interventions: CBT, DBT, Solution Focused, Strength-based, Supportive, and Reframing   Summary: Clinician continued topic of mindfulness. Group discussed "What" and "How" skills of mindfulness. Patients identified what types of activities would help them practice mindfulness. Patients took part in Progressive Muscle Relaxation and other activities and discussed which would work best for them.    Therapist Response: Pt engaged in discussion and reports understanding. Pt identifies practicing 5-4-3-2-1 for timed mindfulness practice.  Session Time: 12:00 -1:00   Participation Level: Active   Behavioral Response: CasualAlertDepressed   Type of  Therapy: Group therapy, Occupational Therapy   Treatment Goals addressed: Coping   Progress Towards Goals: Progressing   Interventions: Supportive; Psychoeducation   Summary: 12:00 - 12:50: Occupational Therapy group led by cln [name]. 12:50 - 1:00 Clinician assessed for immediate needs, medication compliance and efficacy, and safety concerns.   Therapist Response: 12:00 - 12:50: Pt participated 12:50 - 1:00 pm: At check-out, patient reports no immediate concerns. Patient demonstrates progress as evidenced by identifying new skills to use to change her lens. Patient denies SI/HI/self-harm thoughts at the end of group.    Suicidal/Homicidal: Nowithout intent/plan  Plan: Pt will continue in PHP while working to decrease depression symptoms, increase support, and increase ability to manage symptoms in a healthy manner.   Collaboration of Care: Medication Management AEB A Pashayan  Patient/Guardian was advised Release of Information must be obtained prior to any record release in order to collaborate their care with an outside provider. Patient/Guardian was advised if they have not already done so to contact the registration department to sign all necessary forms in order for [name] to release information regarding their care.   Consent: Patient/Guardian gives verbal consent for treatment and assignment of benefits for services provided during this visit. Patient/Guardian expressed understanding and agreed to proceed.   Diagnosis: MDD (major depressive disorder), recurrent severe, without psychosis (HCC) [F33.2]    1. MDD (major depressive disorder), recurrent severe, without psychosis (HCC)   2. GAD (generalized anxiety disorder)      Quinn Axe, [facility]

## 2022-09-19 NOTE — Psych (Unsigned)
Virtual Visit via Video Note  I connected with Tecla Mailloux on 09/19/22 at  9:35 AM EST by a video enabled telemedicine application and verified that I am speaking with the correct person using two identifiers.  Location: Patient: patient home Provider: clinical home office   I discussed the limitations of evaluation and management by telemedicine and the availability of in person appointments. The patient expressed understanding and agreed to proceed.  I discussed the assessment and treatment plan with the patient. The patient was provided an opportunity to ask questions and all were answered. The patient agreed with the plan and demonstrated an understanding of the instructions.   The patient was advised to call back or seek an in-person evaluation if the symptoms worsen or if the condition fails to improve as anticipated.  Pt was provided 240 minutes of non-face-to-face time during this encounter.   [name], LCSW   [facility] PHP THERAPIST PROGRESS NOTE  Hong Timm [id number]  Session Time: 9:00 - 10:00  Participation Level: Active  Behavioral Response: CasualAlertDepressed  Type of Therapy: Group Therapy  Treatment Goals addressed: Coping  Progress Towards Goals: Progressing  Interventions: CBT, DBT, Supportive, and Reframing  Summary: Rhonda [name] is a 31 y.o. female who presents with depression symptoms.  Clinician led check-in regarding current stressors and situation, and review of patient completed daily inventory. Clinician utilized active listening and empathetic response and validated patient emotions. Clinician facilitated processing group on pertinent issues.?   Therapist Response: Patient arrived within time allowed. Patient rates her mood at a 1.5 on a scale of 1-10 with 10 being best. Pt states she feels irritable. Pt reports she had a bad weekend and an issue with relationships. Pt reports struggling with vulnerability and trusting people. Pt reports she  needs to work on negative reactions. Pt identifies passive SI and denies plan and intent. Patient able to process. Patient engaged in discussion.       Session Time: 10:00 am - 11:00 am   Participation Level: Active   Behavioral Response: CasualAlertDepressed   Type of Therapy: Group Therapy   Treatment Goals addressed: Coping   Progress Towards Goals: Progressing   Interventions: CBT, DBT, Solution Focused, Strength-based, Supportive, and Reframing   Therapist Response: Cln facilitated processing group around difficult relationships. Group members shared struggles they face in relationships. Cln brought in topics of self-esteem, CBT thought challenging, boundaries, and communication to aid growth.  Therapist Response:  Pt engaged in discussion and is able to process.           Session Time: 11:00 -12:00   Participation Level: Active   Behavioral Response: CasualAlertDepressed   Type of Therapy: Group Therapy   Treatment Goals addressed: Coping   Progress Towards Goals: Progressing   Interventions: CBT, DBT, Solution Focused, Strength-based, Supportive, and Reframing   Summary: Cln introduced topic of boundaries. Cln discussed how boundaries inform our relationships and affect self-esteem and personal agency. Group discussed the three types of boundaries: rigid, porous, and healthy and when each type is most helpful/harmful.    Therapist Response: Pt engaged in discussion       Session Time: 12:00 -1:00   Participation Level: Active   Behavioral Response: CasualAlertDepressed   Type of Therapy: Group therapy, Occupational Therapy   Treatment Goals addressed: Coping   Progress Towards Goals: Progressing   Interventions: Supportive; Psychoeducation   Summary: 12:00 - 12:50: Occupational Therapy group led by cln [name]. 12:50 - 1:00 Clinician assessed for immediate needs, medication compliance  and efficacy, and safety concerns.   Therapist Response:  12:00 - 12:50: Pt participated 12:50 - 1:00 pm: At check-out, patient reports no immediate concerns. Patient demonstrates progress as evidenced by opening up in group. Patient denies SI/HI/self-harm thoughts at the end of group.    Suicidal/Homicidal: Nowithout intent/plan  Plan: Pt will continue in PHP while working to decrease depression symptoms, increase support, and increase ability to manage symptoms in a healthy manner.   Collaboration of Care: Medication Management AEB A Pashayan  Patient/Guardian was advised Release of Information must be obtained prior to any record release in order to collaborate their care with an outside provider. Patient/Guardian was advised if they have not already done so to contact the registration department to sign all necessary forms in order for [name] to release information regarding their care.   Consent: Patient/Guardian gives verbal consent for treatment and assignment of benefits for services provided during this visit. Patient/Guardian expressed understanding and agreed to proceed.   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 Major depressive disorder, recurrent episode, severe with anxious distress (HCC)      [name], LCSW

## 2022-09-19 NOTE — Psych (Signed)
Virtual Visit via Video Note  I connected with Rhonda [name] on 09/15/22 at  9:35 AM EST by a video enabled telemedicine application and verified that I am speaking with the correct person using two identifiers.  Location: Patient: patient home Provider: clinical home office   I discussed the limitations of evaluation and management by telemedicine and the availability of in person appointments. The patient expressed understanding and agreed to proceed.  I discussed the assessment and treatment plan with the patient. The patient was provided an opportunity to ask questions and all were answered. The patient agreed with the plan and demonstrated an understanding of the instructions.   The patient was advised to call back or seek an in-person evaluation if the symptoms worsen or if the condition fails to improve as anticipated.  Pt was provided 240 minutes of non-face-to-face time during this encounter.   Rhonda [name], [facility]   [facility] PHP THERAPIST PROGRESS NOTE  Rhonda [name] [id number]  Session Time: 9:00 - 10:00  Participation Level: Active  Behavioral Response: CasualAlertDepressed  Type of Therapy: Group Therapy  Treatment Goals addressed: Coping  Progress Towards Goals: Initial  Interventions: CBT, DBT, Supportive, and Reframing  Summary: Clinician led check-in regarding current stressors and situation, and review of patient completed daily inventory. Clinician utilized active listening and empathetic response and validated patient emotions. Clinician facilitated processing group on pertinent issues.?   Therapist Response: Rhonda [name] is a 31 y.o. female who presents with depression symptoms. Patient arrived within time allowed. Patient rates her mood at a 2 on a scale of 1-10 with 10 being best. Pt states she feels "ok." Pt reports her anxiety and depression are an 8 on a scale of 1-low, 10-high. Pt reports continued decrease in appetite and sleep. Pt shares she was able to  attend a friend's son's school concert last night long enough to watch the son. Pt reports it made her feel good to keep her word to go and to see how excited the son was to see pt. Patient able to process. Patient engaged in discussion.       Session Time: 10:00 am - 11:00 am   Participation Level: Active   Behavioral Response: CasualAlertDepressed   Type of Therapy: Group Therapy   Treatment Goals addressed: Coping   Progress Towards Goals: Progressing   Interventions: CBT, DBT, Solution Focused, Strength-based, Supportive, and Reframing   Therapist Response: Cln led discussion on reframing cognitive distortions. Group spent time discussion some common cognitive distortions including magnification and minimization.    Therapist Response:  Pt engaged in discussion.  Pt is able to identify how to reframe and practice with group. Pt reports beating self up for "negatives" and will try to reframe her negative thoughts to focus on the positives.     Session Time: 11:00 -12:00   Participation Level: Active   Behavioral Response: CasualAlertDepressed   Type of Therapy: Group Therapy   Treatment Goals addressed: Coping   Progress Towards Goals: Progressing   Interventions: CBT, DBT, Solution Focused, Strength-based, Supportive, and Reframing   Summary: Clinician introduced topic of "Positive Psychology". Group watched "Positive Psychology" Ted-Talk. Patients discussed how their "lens" of life affects the way they feel. Group discussed 5 strategies to help change lens. Patients identified one strategy they would be willing to try to change their "lens" for at least 21 days to create a new habit.   Therapist Response: Pt engaged in discussion and reports understanding. Pt identifies with pushing goal post for  success and happiness. Pt reports she will practice exercise each morning to help change her lens.         Session Time: 12:00 -1:00   Participation Level: Active    Behavioral Response: CasualAlertDepressed   Type of Therapy: Group therapy, Occupational Therapy   Treatment Goals addressed: Coping   Progress Towards Goals: Progressing   Interventions: Supportive; Psychoeducation   Summary: 12:00 - 12:50: Occupational Therapy group led by cln [name]. 12:50 - 1:00 Clinician assessed for immediate needs, medication compliance and efficacy, and safety concerns.   Therapist Response: 12:00 - 12:50: Pt participated 12:50 - 1:00 pm: At check-out, patient reports no immediate concerns. Patient demonstrates progress as evidenced by identifying new skills to use to change her lens. Patient denies SI/HI/self-harm thoughts at the end of group.    Suicidal/Homicidal: Nowithout intent/plan  Plan: Pt will continue in PHP while working to decrease depression symptoms, increase support, and increase ability to manage symptoms in a healthy manner.   Collaboration of Care: Medication Management AEB A Pashayan  Patient/Guardian was advised Release of Information must be obtained prior to any record release in order to collaborate their care with an outside provider. Patient/Guardian was advised if they have not already done so to contact the registration department to sign all necessary forms in order for [name] to release information regarding their care.   Consent: Patient/Guardian gives verbal consent for treatment and assignment of benefits for services provided during this visit. Patient/Guardian expressed understanding and agreed to proceed.   Diagnosis: Major depressive disorder, recurrent episode, severe with anxious distress ([place]) [F33.2]    1. Major depressive disorder, recurrent episode, severe with anxious distress ([place])   2. Generalized anxiety disorder      Royetta Crochet, [facility]

## 2022-09-20 ENCOUNTER — Ambulatory Visit (INDEPENDENT_AMBULATORY_CARE_PROVIDER_SITE_OTHER): Payer: Medicaid Other | Admitting: Licensed Clinical Social Worker

## 2022-09-20 ENCOUNTER — Other Ambulatory Visit (HOSPITAL_COMMUNITY): Payer: Self-pay

## 2022-09-20 ENCOUNTER — Ambulatory Visit (HOSPITAL_COMMUNITY): Payer: Self-pay

## 2022-09-20 DIAGNOSIS — F332 Major depressive disorder, recurrent severe without psychotic features: Secondary | ICD-10-CM

## 2022-09-20 DIAGNOSIS — F411 Generalized anxiety disorder: Secondary | ICD-10-CM

## 2022-09-20 DIAGNOSIS — G4700 Insomnia, unspecified: Secondary | ICD-10-CM

## 2022-09-20 MED ORDER — TRAZODONE HCL 50 MG PO TABS: 50.0000 mg | ORAL_TABLET | Freq: Every day | ORAL | 1 refills | Status: DC

## 2022-09-20 MED ORDER — HYDROXYZINE HCL 25 MG PO TABS: 25.0000 mg | ORAL_TABLET | Freq: Three times a day (TID) | ORAL | 1 refills | Status: DC | PRN

## 2022-09-20 MED ORDER — SERTRALINE HCL 50 MG PO TABS: 50.0000 mg | ORAL_TABLET | Freq: Every day | ORAL | 1 refills | Status: DC

## 2022-09-20 NOTE — Psych (Signed)
Virtual Visit via Video Note  I connected with Rhonda [name] on 09/20/22 at  9:35 AM EST by a video enabled telemedicine application and verified that I am speaking with the correct person using two identifiers.  Location: Patient: patient home Provider: clinical home office   I discussed the limitations of evaluation and management by telemedicine and the availability of in person appointments. The patient expressed understanding and agreed to proceed.  I discussed the assessment and treatment plan with the patient. The patient was provided an opportunity to ask questions and all were answered. The patient agreed with the plan and demonstrated an understanding of the instructions.   The patient was advised to call back or seek an in-person evaluation if the symptoms worsen or if the condition fails to improve as anticipated.  Pt was provided 240 minutes of non-face-to-face time during this encounter.   Rhonda Guiles, LCSW   [facility] PHP THERAPIST PROGRESS NOTE  Rhonda [name] [id number]  Session Time: 9:00 - 10:00  Participation Level: Active  Behavioral Response: CasualAlertDepressed  Type of Therapy: Group Therapy  Treatment Goals addressed: Coping  Progress Towards Goals: Progressing  Interventions: CBT, DBT, Supportive, and Reframing  Summary: Rhonda [name] is a 31 y.o. female who presents with depression symptoms.  Clinician led check-in regarding current stressors and situation, and review of patient completed daily inventory. Clinician utilized active listening and empathetic response and validated patient emotions. Clinician facilitated processing group on pertinent issues.?   Therapist Response: Patient arrived within time allowed. Patient rates her mood at a 2.5 on a scale of 1-10 with 10 being best. Pt states she feels "emotionally exhausted." Pt reports she was up sick last night and thinks her body is reacting to her anxiety. Pt reports ruminating on the bad weekend and  being alone for the holidays. Pt reports sleeping 2 hours and not keeping any food down. Pt identifies passive SI and denies plan and intent. Patient able to process. Patient engaged in discussion.       Session Time: 10:00 am - 11:00 am   Participation Level: Active   Behavioral Response: CasualAlertDepressed   Type of Therapy: Group Therapy   Treatment Goals addressed: Coping   Progress Towards Goals: Progressing   Interventions: CBT, DBT, Solution Focused, Strength-based, Supportive, and Reframing   Therapist Response:  Cln led discussion on negative self-talk and how it affects Rhonda [name]. Cln utilized CBT to discuss how thoughts shape our feelings and actions. Group members shared how negative thinking affects them and worked to reframe their negative thinking.    Therapist Response:  Pt engaged in discussion and reports understanding.          Session Time: 11:00 -12:00   Participation Level: Active   Behavioral Response: CasualAlertDepressed   Type of Therapy: Group Therapy   Treatment Goals addressed: Coping   Progress Towards Goals: Progressing   Interventions: CBT, DBT, Solution Focused, Strength-based, Supportive, and Reframing   Summary: Cln led discussion on planning ahead as a way to mitigate anxiety. Group members shared worries. Group able to brainstorm ways to build habits now and how they can fit into being more prepared for a stressful event.    Therapist Response:  Pt engaged in discussion and identified ways to plan ahead for anxieties.        Session Time: 12:00 -1:00   Participation Level: Active   Behavioral Response: CasualAlertDepressed   Type of Therapy: Group therapy, Occupational Therapy   Treatment Goals addressed: Coping  Progress Towards Goals: Progressing   Interventions: Supportive; Psychoeducation   Summary: 12:00 - 12:50: Occupational Therapy group led by cln [name]. 12:50 - 1:00 Clinician assessed for immediate needs,  medication compliance and efficacy, and safety concerns.   Therapist Response: 12:00 - 12:50: Pt participated 12:50 - 1:00 pm: At check-out, patient reports no immediate concerns. Patient demonstrates progress as evidenced by challenging negative thoughts. Patient denies SI/HI/self-harm thoughts at the end of group.    Suicidal/Homicidal: Nowithout intent/plan  Plan: Pt will discharge from PHP due to meeting treatment goals of decreased depression symptoms, increased support, and increased ability to manage symptoms in a healthy manner. Pt will step down to OP therapy/psychiatry at this agency and is encouraged to engage in support groups with [facility], [place], and [employer]. Pt and provider are aligned with discharge plan. Pt denies SI/HI at time of discharge.   Collaboration of Care: Medication Management AEB A Pashayan  Patient/Guardian was advised Release of Information must be obtained prior to any record release in order to collaborate their care with an outside provider. Patient/Guardian was advised if they have not already done so to contact the registration department to sign all necessary forms in order for Rhonda [name] to release information regarding their care.   Consent: Patient/Guardian gives verbal consent for treatment and assignment of benefits for services provided during this visit. Patient/Guardian expressed understanding and agreed to proceed.   Diagnosis: Major depressive disorder, recurrent episode, severe with anxious distress ([place]) [F33.2]    1. Major depressive disorder, recurrent episode, severe with anxious distress ([place])   2. Insomnia, unspecified type   3. Generalized anxiety disorder      Rhonda Glass, LCSW

## 2022-09-20 NOTE — Psych (Signed)
Virtual Visit via Video Note  I connected with Rhonda [name] on 09/14/22 at  9:35 AM EST by a video enabled telemedicine application and verified that I am speaking with the correct person using two identifiers.  Location: Patient: patient home Provider: clinical home office   I discussed the limitations of evaluation and management by telemedicine and the availability of in person appointments. The patient expressed understanding and agreed to proceed.  I discussed the assessment and treatment plan with the patient. The patient was provided an opportunity to ask questions and all were answered. The patient agreed with the plan and demonstrated an understanding of the instructions.   The patient was advised to call back or seek an in-person evaluation if the symptoms worsen or if the condition fails to improve as anticipated.  Pt was provided 240 minutes of non-face-to-face time during this encounter.   [name], LCSW   [facility] PHP THERAPIST PROGRESS NOTE  Rhonda [name] [id number]  Session Time: 9:00 - 10:00  Participation Level: Active  Behavioral Response: CasualAlertDepressed  Type of Therapy: Group Therapy  Treatment Goals addressed: Coping  Progress Towards Goals: Progressing  Interventions: CBT, DBT, Supportive, and Reframing  Summary: Rhonda [name] is a 31 y.o. female who presents with depression symptoms.  Clinician led check-in regarding current stressors and situation, and review of patient completed daily inventory. Clinician utilized active listening and empathetic response and validated patient emotions. Clinician facilitated processing group on pertinent issues.?   Therapist Response: Patient arrived within time allowed. Patient rates her mood at a 6 on a scale of 1-10 with 10 being best. Pt states she feels "anxious." Pt reports sleeping 7 hours and eating 2x. Pt reports continued struggles with loneliness. Patient able to process. Patient engaged in discussion.        Session Time: 10:00 am - 11:00 am   Participation Level: Active   Behavioral Response: CasualAlertDepressed   Type of Therapy: Group Therapy   Treatment Goals addressed: Coping   Progress Towards Goals: Progressing   Interventions: CBT, DBT, Solution Focused, Strength-based, Supportive, and Reframing   Therapist Response: Cln led processing group for pt's current struggles. Group members shared stressors and provided support and feedback. Cln brought in topics of boundaries, healthy relationships, and unhealthy thought processes to inform discussion.    Therapist Response:  Pt able to process and provide support to group.          Session Time: 11:00 -12:00   Participation Level: Active   Behavioral Response: CasualAlertDepressed   Type of Therapy: Group Therapy, Spiritual Care   Treatment Goals addressed: Coping   Progress Towards Goals: Progressing   Interventions: Supportive, Education   Summary:  Rhonda [name], Chaplain, led group.   Therapist Response: Pt participated         Session Time: 12:00 -1:00   Participation Level: Active   Behavioral Response: CasualAlertDepressed   Type of Therapy: Group therapy, Occupational Therapy   Treatment Goals addressed: Coping   Progress Towards Goals: Progressing   Interventions: Supportive; Psychoeducation   Summary: 12:00 - 12:50: Occupational Therapy group led by cln [name]. 12:50 - 1:00 Clinician assessed for immediate needs, medication compliance and efficacy, and safety concerns.   Therapist Response: 12:00 - 12:50: Pt participated 12:50 - 1:00 pm: At check-out, patient reports no immediate concerns. Patient demonstrates progress as evidenced by continued engagement in group and responsiveness to treatment. Patient denies SI/HI/self-harm thoughts at the end of group.   Suicidal/Homicidal: Nowithout intent/plan  Plan: Pt  will continue in PHP while working to decrease depression symptoms,  increase support, and increase ability to manage symptoms in a healthy manner.   Collaboration of Care: Medication Management AEB A Pashayan  Patient/Guardian was advised Release of Information must be obtained prior to any record release in order to collaborate their care with an outside provider. Patient/Guardian was advised if they have not already done so to contact the registration department to sign all necessary forms in order for [name] to release information regarding their care.   Consent: Patient/Guardian gives verbal consent for treatment and assignment of benefits for services provided during this visit. Patient/Guardian expressed understanding and agreed to proceed.   Diagnosis: MDD (major depressive disorder), recurrent severe, without psychosis (HCC) [F33.2]    1. MDD (major depressive disorder), recurrent severe, without psychosis (HCC)   2. Generalized anxiety disorder      [name], LCSW

## 2022-09-20 NOTE — Psych (Signed)
Virtual Visit via Video Note  I connected with Rhonda [name] on 09/09/22 at  9:35 AM EST by a video enabled telemedicine application and verified that I am speaking with the correct person using two identifiers.  Location: Patient: patient home Provider: clinical home office   I discussed the limitations of evaluation and management by telemedicine and the availability of in person appointments. The patient expressed understanding and agreed to proceed.  I discussed the assessment and treatment plan with the patient. The patient was provided an opportunity to ask questions and all were answered. The patient agreed with the plan and demonstrated an understanding of the instructions.   The patient was advised to call back or seek an in-person evaluation if the symptoms worsen or if the condition fails to improve as anticipated.  Pt was provided 240 minutes of non-face-to-face time during this encounter.   [name], LCSW   [facility] PHP THERAPIST PROGRESS NOTE  Rhonda [name] [id number]  Session Time: 9:00 - 10:00  Participation Level: Active  Behavioral Response: CasualAlertDepressed  Type of Therapy: Group Therapy  Treatment Goals addressed: Coping  Progress Towards Goals: Progressing  Interventions: CBT, DBT, Supportive, and Reframing  Summary: Rhonda [name] is a 31 y.o. female who presents with depression symptoms.  Clinician led check-in regarding current stressors and situation, and review of patient completed daily inventory. Clinician utilized active listening and empathetic response and validated patient emotions. Clinician facilitated processing group on pertinent issues.?   Therapist Response: Patient arrived within time allowed. Patient rates her mood at a 5 on a scale of 1-10 with 10 being best. Pt states she feels "anxious." Pt reports sleeping 5 hours and eating 1x. Pt reports anxiety regarding the future. Patient able to process. Patient engaged in discussion.        Session Time: 10:00 am - 11:00 am   Participation Level: Active   Behavioral Response: CasualAlertDepressed   Type of Therapy: Group Therapy   Treatment Goals addressed: Coping   Progress Towards Goals: Progressing   Interventions: CBT, DBT, Solution Focused, Strength-based, Supportive, and Reframing   Therapist Response:  Cln introduced DBT interpersonal effectiveness skill for self-respect, FAST. Cln led discussion on barriers to utilizing this skill, how it could be helpful, and situations in which they could have utilized it.    Therapist Response: Pt engaged in discussion and reports willingness to utilize FAST.          Session Time: 11:00 -12:00   Participation Level: Active   Behavioral Response: CasualAlertDepressed   Type of Therapy: Group Therapy   Treatment Goals addressed: Coping   Progress Towards Goals: Progressing   Interventions: CBT, DBT, Solution Focused, Strength-based, Supportive, and Reframing   Summary: Cln continued topic of DBT distress tolerance skills. Cln introduced Self-Soothe skills. Group discussed ways they can utilize the five senses to soothe themselves when struggling.    Therapist Response:  Pt engaged in discussion and shares ways she can apply skills.          Session Time: 12:00 -1:00   Participation Level: Active   Behavioral Response: CasualAlertDepressed   Type of Therapy: Group therapy, Occupational Therapy   Treatment Goals addressed: Coping   Progress Towards Goals: Progressing   Interventions: Supportive; Psychoeducation   Summary: 12:00 - 12:50: Occupational Therapy group led by cln [name]. 12:50 - 1:00 Clinician assessed for immediate needs, medication compliance and efficacy, and safety concerns.   Therapist Response: 12:00 - 12:50: Pt participated 12:50 - 1:00 pm: At  check-out, patient reports no immediate concerns. Patient demonstrates progress as evidenced by continued engagement in group and  responsiveness to treatment. Patient denies SI/HI/self-harm thoughts at the end of group.    Suicidal/Homicidal: Nowithout intent/plan  Plan: Pt will continue in PHP while working to decrease depression symptoms, increase support, and increase ability to manage symptoms in a healthy manner.   Collaboration of Care: Medication Management AEB A Pashayan  Patient/Guardian was advised Release of Information must be obtained prior to any record release in order to collaborate their care with an outside provider. Patient/Guardian was advised if they have not already done so to contact the registration department to sign all necessary forms in order for [name] to release information regarding their care.   Consent: Patient/Guardian gives verbal consent for treatment and assignment of benefits for services provided during this visit. Patient/Guardian expressed understanding and agreed to proceed.   Diagnosis: MDD (major depressive disorder), recurrent severe, without psychosis (HCC) [F33.2]    1. MDD (major depressive disorder), recurrent severe, without psychosis (HCC)   2. GAD (generalized anxiety disorder)      [name], LCSW

## 2022-09-20 NOTE — Psych (Signed)
Virtual Visit via Video Note  I connected with Rhonda [name] on 09/06/22 at  9:35 AM EST by a video enabled telemedicine application and verified that I am speaking with the correct person using two identifiers.  Location: Patient: patient home Provider: clinical home office   I discussed the limitations of evaluation and management by telemedicine and the availability of in person appointments. The patient expressed understanding and agreed to proceed.  I discussed the assessment and treatment plan with the patient. The patient was provided an opportunity to ask questions and all were answered. The patient agreed with the plan and demonstrated an understanding of the instructions.   The patient was advised to call back or seek an in-person evaluation if the symptoms worsen or if the condition fails to improve as anticipated.  Pt was provided 240 minutes of non-face-to-face time during this encounter.   [name], LCSW   [facility] PHP THERAPIST PROGRESS NOTE  Rhonda [name] [id number]  Session Time: 9:00 - 10:00  Participation Level: Active  Behavioral Response: CasualAlertDepressed  Type of Therapy: Group Therapy  Treatment Goals addressed: Coping  Progress Towards Goals: Progressing  Interventions: CBT, DBT, Supportive, and Reframing  Summary: Rhonda [name] is a 31 y.o. female who presents with depression symptoms.  Clinician led check-in regarding current stressors and situation, and review of patient completed daily inventory. Clinician utilized active listening and empathetic response and validated patient emotions. Clinician facilitated processing group on pertinent issues.?   Therapist Response: Patient arrived within time allowed. Patient rates her mood at a 5 on a scale of 1-10 with 10 being best. Pt states she feels "okay." Pt reports sleeping 6.5 hours and eating 3x. Pt reports getting out of the house yesterday which was helpful. Patient able to process. Patient  engaged in discussion.       Session Time: 10:00 am - 11:00 am   Participation Level: Active   Behavioral Response: CasualAlertDepressed   Type of Therapy: Group Therapy   Treatment Goals addressed: Coping   Progress Towards Goals: Progressing   Interventions: CBT, DBT, Solution Focused, Strength-based, Supportive, and Reframing   Therapist Response: Cln led discussion on reverting to old behaviors. Group members discussed ways in which they feel they have reverted currently or in the past. Group members report fear of reverting to old behaviors and they struggle to manage that fear. Cln informed discussion with CBT thought challenging and DBT distress tolerance skills.    Therapist Response:  Pt engaged in discussion.         Session Time: 11:00 -12:00   Participation Level: Active   Behavioral Response: CasualAlertDepressed   Type of Therapy: Group Therapy   Treatment Goals addressed: Coping   Progress Towards Goals: Progressing   Interventions: CBT, DBT, Solution Focused, Strength-based, Supportive, and Reframing   Summary: Cln led discussion on family dynamics and the way in which they impact [name]. Group members shared struggles with their families and the way the patterns of behaviors have negatively impacted them. Cln provided space to process and validated pt's experiences.    Therapist Response:  Pt engaged in discussion and is able to process.         Session Time: 12:00 -1:00   Participation Level: Active   Behavioral Response: CasualAlertDepressed   Type of Therapy: Group therapy, Occupational Therapy   Treatment Goals addressed: Coping   Progress Towards Goals: Progressing   Interventions: Supportive; Psychoeducation   Summary: 12:00 - 12:50: Occupational Therapy group led by cln E.  Hollan. 12:50 - 1:00 Clinician assessed for immediate needs, medication compliance and efficacy, and safety concerns.   Therapist Response: 12:00 - 12:50: Pt  participated 12:50 - 1:00 pm: At check-out, patient reports no immediate concerns. Patient demonstrates progress as evidenced by continued engagement in group and responsiveness to treatment. Patient denies SI/HI/self-harm thoughts at the end of group.    Suicidal/Homicidal: Nowithout intent/plan  Plan: Pt will continue in PHP while working to decrease depression symptoms, increase support, and increase ability to manage symptoms in a healthy manner.   Collaboration of Care: Medication Management AEB A Pashayan  Patient/Guardian was advised Release of Information must be obtained prior to any record release in order to collaborate their care with an outside provider. Patient/Guardian was advised if they have not already done so to contact the registration department to sign all necessary forms in order for [name] to release information regarding their care.   Consent: Patient/Guardian gives verbal consent for treatment and assignment of benefits for services provided during this visit. Patient/Guardian expressed understanding and agreed to proceed.   Diagnosis: MDD (major depressive disorder), recurrent severe, without psychosis (HCC) [F33.2]    1. MDD (major depressive disorder), recurrent severe, without psychosis (HCC)   2. Generalized anxiety disorder      [name], LCSW

## 2022-09-20 NOTE — Progress Notes (Signed)
[facility] Psych Discharge Summary  Rhonda [name] [id number]  Admission date: 08/31/2022 Discharge date: 09/20/2022  Reason for admission:  She reports that she just does not feel normal and that this has been going on for a long time.  She reports her emotions are like a roller coaster constantly going up and down all the time.  She reports that she feels overwhelmed every day and that if she starts to talk about was going on she gets emotional.  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  She reports that she feels like she is stuck in place.  She reports that the death of her brother 2 years ago significantly hindered her and then the death of her other brother 1 year later just made things worse.  She reports that she always has troubles with sleep and if she drinks she is able to so she is also concerned about how much she has been drinking.  She reports that Thanksgiving she did have thoughts of OD'ing.   Progress in Program Toward Treatment Goals: Progressing  Progress (rationale):  Rhonda [name] is a 31 yr old female who presents via Virtual Video Visit for follow up and for medication management, she enrolled in the [facility] 08/31/2022.  PPHx is significant for Depression, Anxiety, and PTSD, and Multiple Suicide Attempts (~3 OD's and 2020 held gun to head and pulled trigger), and no history of Self Injurious Behavior or Psychiatric Hospitalizations.   She reports that she had a bad weekend.  She reports that she had a significant trigger and got really enraged and broke some things.  She reports that she slept poorly last night and is now just sad because it is the holidays and she feels abandoned.  She reports that in spite of all this she does feel improvement since switching to the Zoloft.  She reports that things stopped worsening when she started the Zoloft and that when she increased to 50 mg she reports  that when things started to feel like they were getting better.  She reports she also knows she needs to drink less and continue to focus on herself.  Provided encouragement and support that her recognizing these things is a key for step to making long-lasting improvements.  She reports she is not having any side effects to the Zoloft.  She has follow-up as established for medication management and therapy.  She reports no SI, HI, or AVH.  She reports her sleep last night was poor.  She reports her appetite is fair.  She reports some nausea/vomiting and headache/dizziness due to the anxiety she had over everything that happened over the weekend.  She reports no other concerns at present.    Psychiatric Specialty Exam:   Review of Systems  Respiratory:  Negative for shortness of breath.   Cardiovascular:  Negative for chest pain.  Gastrointestinal:  Positive for nausea and vomiting (anxiety). Negative for abdominal pain, constipation and diarrhea.  Neurological:  Positive for dizziness and headaches. Negative for weakness.  Psychiatric/Behavioral:  Positive for dysphoric mood and sleep disturbance. Negative for hallucinations and suicidal ideas. The patient is nervous/anxious.     unknown if currently breastfeeding.There is no height or weight on file to calculate BMI.  General Appearance: Casual and Fairly Groomed  Eye Contact:  Good  Speech:  Clear and Coherent and Normal Rate  Volume:  Normal  Mood:  Anxious and Depressed  Affect:  Congruent and Tearful  Thought Process:  Coherent and Goal Directed  Orientation:  Full (Time, Place, and Person)  Thought Content:  WDL and Logical  Suicidal Thoughts:  No  Homicidal Thoughts:  No  Memory:  Immediate;   Good Recent;   Good  Judgement:  Good  Insight:  Good  Psychomotor Activity:  Normal  Concentration:  Concentration: Fair and Attention Span: Fair  Recall:  Good  Fund of Knowledge:  Good  Language:  Good  Akathisia:  Negative  Handed:   Right  AIMS (if indicated):     Assets:  Communication Skills Desire for Improvement Housing Physical Health Resilience  ADL's:  Intact  Cognition:  WNL  Sleep:   poor      Discharge Plan: Referral to Psychiatrist and Referral to Counselor/Psychotherapist at [facility]  Collaboration of Care: Psychiatrist [facility], Referral or follow-up with counselor/therapist [facility], and Other PHP Program  Patient/Guardian was advised Release of Information must be obtained prior to any record release in order to collaborate their care with an outside provider. Patient/Guardian was advised if they have not already done so to contact the registration department to sign all necessary forms in order for [name] to release information regarding their care.   Consent: Patient/Guardian gives verbal consent for treatment and assignment of benefits for services provided during this visit. Patient/Guardian expressed understanding and agreed to proceed.   [facility]-PHP THERAPIST 09/20/2022

## 2022-09-20 NOTE — Psych (Signed)
Virtual Visit via Video Note  I connected with Rhonda [name] on 09/05/22 at  9:35 AM EST by a video enabled telemedicine application and verified that I am speaking with the correct person using two identifiers.  Location: Patient: patient home Provider: clinical home office   I discussed the limitations of evaluation and management by telemedicine and the availability of in person appointments. The patient expressed understanding and agreed to proceed.  I discussed the assessment and treatment plan with the patient. The patient was provided an opportunity to ask questions and all were answered. The patient agreed with the plan and demonstrated an understanding of the instructions.   The patient was advised to call back or seek an in-person evaluation if the symptoms worsen or if the condition fails to improve as anticipated.  Pt was provided 240 minutes of non-face-to-face time during this encounter.   Rhonda Guiles, LCSW   [facility] PHP THERAPIST PROGRESS NOTE  Aadvika Konen [id number]  Session Time: 9:00 - 10:00  Participation Level: Active  Behavioral Response: CasualAlertDepressed  Type of Therapy: Group Therapy  Treatment Goals addressed: Coping  Progress Towards Goals: Initial  Interventions: CBT, DBT, Supportive, and Reframing  Summary: Rhonda [name] is a 31 y.o. female who presents with depression symptoms.  Clinician led check-in regarding current stressors and situation, and review of patient completed daily inventory. Clinician utilized active listening and empathetic response and validated patient emotions. Clinician facilitated processing group on pertinent issues.?   Therapist Response: Patient arrived within time allowed. Patient rates her mood at a 5 on a scale of 1-10 with 10 being best. Pt states she feels "blah." Pt reports sleeping 8 hours and eating 1x. Pt reports having panic attacks over the weekend and struggling managing. Patient able to process. Patient  engaged in discussion.       Session Time: 10:00 am - 11:00 am   Participation Level: Active   Behavioral Response: CasualAlertDepressed   Type of Therapy: Group Therapy   Treatment Goals addressed: Coping   Progress Towards Goals: Progressing   Interventions: CBT, DBT, Solution Focused, Strength-based, Supportive, and Reframing   Therapist Response: Cln led discussion on staying present. Cln highlighted CBT distorted thoughts of catastrophizing and fortune telling and encouraged pt's to think about today and tomorrow and not past that to address those distortions. Group members shared struggles they have with looking into the future.    Therapist Response:  Pt is able to process and discuss how to apply strategies discussed.          Session Time: 11:00 -12:00   Participation Level: Active   Behavioral Response: CasualAlertDepressed   Type of Therapy: Group Therapy   Treatment Goals addressed: Coping   Progress Towards Goals: Progressing   Interventions: CBT, DBT, Solution Focused, Strength-based, Supportive, and Reframing   Summary: Cln introduced topic of vulnerability. Group viewed TED talk "the power of vulnerability" to aid discussion. Group members shared the relationship they have with vulnerability and how it impacts their relationships.    Therapist Response:  Pt engaged in discussion and reports vulnerability is a struggle for them.          Session Time: 12:00 -1:00   Participation Level: Active   Behavioral Response: CasualAlertDepressed   Type of Therapy: Group therapy, Occupational Therapy   Treatment Goals addressed: Coping   Progress Towards Goals: Progressing   Interventions: Supportive; Psychoeducation   Summary: 12:00 - 12:50: Occupational Therapy group led by cln [name]. 12:50 - 1:00 Clinician  assessed for immediate needs, medication compliance and efficacy, and safety concerns.   Therapist Response: 12:00 - 12:50: Pt  participated 12:50 - 1:00 pm: At check-out, patient reports no immediate concerns. Patient demonstrates progress as evidenced by continued engagement in group and responsiveness to treatment. Patient denies SI/HI/self-harm thoughts at the end of group.    Suicidal/Homicidal: Nowithout intent/plan  Plan: Pt will continue in PHP while working to decrease depression symptoms, increase support, and increase ability to manage symptoms in a healthy manner.   Collaboration of Care: Medication Management AEB A Pashayan  Patient/Guardian was advised Release of Information must be obtained prior to any record release in order to collaborate their care with an outside provider. Patient/Guardian was advised if they have not already done so to contact the registration department to sign all necessary forms in order for [name] to release information regarding their care.   Consent: Patient/Guardian gives verbal consent for treatment and assignment of benefits for services provided during this visit. Patient/Guardian expressed understanding and agreed to proceed.   Diagnosis: MDD (major depressive disorder), recurrent severe, without psychosis (HCC) [F33.2]    1. MDD (major depressive disorder), recurrent severe, without psychosis (HCC)   2. Generalized anxiety disorder   3. Insomnia, unspecified type      Rhonda Guiles, LCSW

## 2022-09-20 NOTE — Psych (Signed)
Virtual Visit via Video Note  I connected with Rhonda [name] on 09/12/22 at  9:35 AM EST by a video enabled telemedicine application and verified that I am speaking with the correct person using two identifiers.  Location: Patient: patient home Provider: clinical home office   I discussed the limitations of evaluation and management by telemedicine and the availability of in person appointments. The patient expressed understanding and agreed to proceed.  I discussed the assessment and treatment plan with the patient. The patient was provided an opportunity to ask questions and all were answered. The patient agreed with the plan and demonstrated an understanding of the instructions.   The patient was advised to call back or seek an in-person evaluation if the symptoms worsen or if the condition fails to improve as anticipated.  Pt was provided 240 minutes of non-face-to-face time during this encounter.   Rhonda Guiles, LCSW   [facility] PHP THERAPIST PROGRESS NOTE  Rhonda [name] [id number]  Session Time: 9:00 - 10:00  Participation Level: Active  Behavioral Response: CasualAlertDepressed  Type of Therapy: Group Therapy  Treatment Goals addressed: Coping  Progress Towards Goals: Progressing  Interventions: CBT, DBT, Supportive, and Reframing  Summary: Rhonda [name] is a 31 y.o. female who presents with depression symptoms.  Clinician led check-in regarding current stressors and situation, and review of patient completed daily inventory. Clinician utilized active listening and empathetic response and validated patient emotions. Clinician facilitated processing group on pertinent issues.?   Therapist Response: Patient arrived within time allowed. Patient rates her mood at a 7 on a scale of 1-10 with 10 being best. Pt states she feels "better." Pt reports sleeping 7 hours and eating 1x. Pt reports struggle with relationships. Patient able to process. Patient engaged in discussion.        Session Time: 10:00 am - 11:00 am   Participation Level: Active   Behavioral Response: CasualAlertDepressed   Type of Therapy: Group Therapy   Treatment Goals addressed: Coping   Progress Towards Goals: Progressing   Interventions: CBT, DBT, Solution Focused, Strength-based, Supportive, and Reframing   Therapist Response:  Cln led processing group for pt's current struggles. Group members shared stressors and provided support and feedback. Cln brought in topics of boundaries, healthy relationships, and unhealthy thought processes to inform discussion.    Therapist Response: Pt able to process and provide support to group.          Session Time: 11:00 -12:00   Participation Level: Active   Behavioral Response: CasualAlertDepressed   Type of Therapy: Group Therapy   Treatment Goals addressed: Coping   Progress Towards Goals: Progressing   Interventions: CBT, DBT, Solution Focused, Strength-based, Supportive, and Reframing   Summary: Cln introduced DBT concept of radical acceptance. Cln discussed radical acceptance as a strategy to decrease distress and to manage situations outside of their control. Group volunteered struggles they are experiencing with control and cln helped group apply radical acceptance.    Therapist Response: Pt engaged in discussion and identified ways they can apply radical acceptance in their life.          Session Time: 12:00 -1:00   Participation Level: Active   Behavioral Response: CasualAlertDepressed   Type of Therapy: Group therapy, Occupational Therapy   Treatment Goals addressed: Coping   Progress Towards Goals: Progressing   Interventions: Supportive; Psychoeducation   Summary: 12:00 - 12:50: Occupational Therapy group led by cln [name]. 12:50 - 1:00 Clinician assessed for immediate needs, medication compliance and efficacy, and safety  concerns.   Therapist Response: 12:00 - 12:50: Pt participated 12:50 - 1:00 pm:  At check-out, patient reports no immediate concerns. Patient demonstrates progress as evidenced by continued engagement in group and responsiveness to treatment. Patient denies SI/HI/self-harm thoughts at the end of group.    Suicidal/Homicidal: Nowithout intent/plan  Plan: Pt will continue in PHP while working to decrease depression symptoms, increase support, and increase ability to manage symptoms in a healthy manner.   Collaboration of Care: Medication Management AEB A Pashayan  Patient/Guardian was advised Release of Information must be obtained prior to any record release in order to collaborate their care with an outside provider. Patient/Guardian was advised if they have not already done so to contact the registration department to sign all necessary forms in order for [name] to release information regarding their care.   Consent: Patient/Guardian gives verbal consent for treatment and assignment of benefits for services provided during this visit. Patient/Guardian expressed understanding and agreed to proceed.   Diagnosis: MDD (major depressive disorder), recurrent severe, without psychosis (HCC) [F33.2]    1. MDD (major depressive disorder), recurrent severe, without psychosis (HCC)   2. GAD (generalized anxiety disorder)      Rhonda Guiles, LCSW

## 2022-09-20 NOTE — Psych (Signed)
Virtual Visit via Video Note  I connected with Reata Petrov on 09/13/22 at  9:35 AM EST by a video enabled telemedicine application and verified that I am speaking with the correct person using two identifiers.  Location: Patient: patient home Provider: clinical home office   I discussed the limitations of evaluation and management by telemedicine and the availability of in person appointments. The patient expressed understanding and agreed to proceed.  I discussed the assessment and treatment plan with the patient. The patient was provided an opportunity to ask questions and all were answered. The patient agreed with the plan and demonstrated an understanding of the instructions.   The patient was advised to call back or seek an in-person evaluation if the symptoms worsen or if the condition fails to improve as anticipated.  Pt was provided 240 minutes of non-face-to-face time during this encounter.   [name], LCSW   [facility] PHP THERAPIST PROGRESS NOTE  Christeen Lai [id number]  Session Time: 9:00 - 10:00  Participation Level: Active  Behavioral Response: CasualAlertDepressed  Type of Therapy: Group Therapy  Treatment Goals addressed: Coping  Progress Towards Goals: Progressing  Interventions: CBT, DBT, Supportive, and Reframing  Summary: Gelisa Tieken is a 31 y.o. female who presents with depression symptoms.  Clinician led check-in regarding current stressors and situation, and review of patient completed daily inventory. Clinician utilized active listening and empathetic response and validated patient emotions. Clinician facilitated processing group on pertinent issues.?   Therapist Response: Patient arrived within time allowed. Patient rates her mood at a 4.5 on a scale of 1-10 with 10 being best. Pt states she feels "kinda stressed." Pt reports sleeping 4 hours and eating 2x. Pt reports struggling with missing her family and loneliness. Patient able to process. Patient  engaged in discussion.       Session Time: 10:00 am - 11:00 am   Participation Level: Active   Behavioral Response: CasualAlertDepressed   Type of Therapy: Group Therapy   Treatment Goals addressed: Coping   Progress Towards Goals: Progressing   Interventions: CBT, DBT, Solution Focused, Strength-based, Supportive, and Reframing   Therapist Response:  Cln led discussion on CBT core beliefs. Cln provided context for core beliefs and how they impact our perception. Group members discussed possible core beliefs they hold and how it is affecting them.    Therapist Response:  Pt engaged in discussion and is able to identify core beliefs they experience.          Session Time: 11:00 -12:00   Participation Level: Active   Behavioral Response: CasualAlertDepressed   Type of Therapy: Group Therapy   Treatment Goals addressed: Coping   Progress Towards Goals: Progressing   Interventions: CBT, DBT, Solution Focused, Strength-based, Supportive, and Reframing   Summary: Cln facilitated processing group around difficult relationships. Group members shared struggles they face in relationships. Cln brought in topics of self-esteem, CBT thought challenging, boundaries, and communication to aid growth.   Therapist Response: Pt engaged in discussion and is able to process.         Session Time: 12:00 -1:00   Participation Level: Active   Behavioral Response: CasualAlertDepressed   Type of Therapy: Group therapy, Occupational Therapy   Treatment Goals addressed: Coping   Progress Towards Goals: Progressing   Interventions: Supportive; Psychoeducation   Summary: 12:00 - 12:50: Occupational Therapy group led by cln [name]. 12:50 - 1:00 Clinician assessed for immediate needs, medication compliance and efficacy, and safety concerns.   Therapist Response: 12:00 -  12:50: Pt participated 12:50 - 1:00 pm: At check-out, patient reports no immediate concerns. Patient  demonstrates progress as evidenced by continued engagement in group and responsiveness to treatment. Patient denies SI/HI/self-harm thoughts at the end of group.   Suicidal/Homicidal: Nowithout intent/plan  Plan: Pt will continue in PHP while working to decrease depression symptoms, increase support, and increase ability to manage symptoms in a healthy manner.   Collaboration of Care: Medication Management AEB A Pashayan  Patient/Guardian was advised Release of Information must be obtained prior to any record release in order to collaborate their care with an outside provider. Patient/Guardian was advised if they have not already done so to contact the registration department to sign all necessary forms in order for [name] to release information regarding their care.   Consent: Patient/Guardian gives verbal consent for treatment and assignment of benefits for services provided during this visit. Patient/Guardian expressed understanding and agreed to proceed.   Diagnosis: MDD (major depressive disorder), recurrent severe, without psychosis (HCC) [F33.2]    1. MDD (major depressive disorder), recurrent severe, without psychosis (HCC)   2. Generalized anxiety disorder   3. Insomnia, unspecified type      [name], LCSW

## 2022-09-20 NOTE — Psych (Signed)
Virtual Visit via Video Note  I connected with Rhonda [name] on 09/02/22 at  9:35 AM EST by a video enabled telemedicine application and verified that I am speaking with the correct person using two identifiers.  Location: Patient: patient home Provider: clinical home office   I discussed the limitations of evaluation and management by telemedicine and the availability of in person appointments. The patient expressed understanding and agreed to proceed.  I discussed the assessment and treatment plan with the patient. The patient was provided an opportunity to ask questions and all were answered. The patient agreed with the plan and demonstrated an understanding of the instructions.   The patient was advised to call back or seek an in-person evaluation if the symptoms worsen or if the condition fails to improve as anticipated.  Pt was provided 240 minutes of non-face-to-face time during this encounter.   Lorin Glass, LCSW   [facility] PHP THERAPIST PROGRESS NOTE  Rhonda [name] [id number]  Session Time: 9:00 - 10:00  Participation Level: Active  Behavioral Response: CasualAlertDepressed  Type of Therapy: Group Therapy  Treatment Goals addressed: Coping  Progress Towards Goals: Initial  Interventions: CBT, DBT, Supportive, and Reframing  Summary: Rhonda [name] is a 31 y.o. female who presents with depression symptoms.  Clinician led check-in regarding current stressors and situation, and review of patient completed daily inventory. Clinician utilized active listening and empathetic response and validated patient emotions. Clinician facilitated processing group on pertinent issues.?   Therapist Response: Patient arrived within time allowed. Patient rates her mood at a 5 on a scale of 1-10 with 10 being best. Pt states she feels "blah." Pt reports sleeping 5 hours and eating 1x. Pt reports difficulty opening up.  Patient able to process. Patient engaged in discussion.       Session  Time: 10:00 am - 11:00 am   Participation Level: Active   Behavioral Response: CasualAlertDepressed   Type of Therapy: Group Therapy   Treatment Goals addressed: Coping   Progress Towards Goals: Progressing   Interventions: CBT, DBT, Solution Focused, Strength-based, Supportive, and Reframing   Therapist Response: Cln led discussion on rest. Cln discussed the need to rewrite social story of rest being earned or last on the list and assert that rest is productive. Group members discussed barriers to allowing themselves rest and the negative self-talk involved.  Cln worked with group to thought challenge and apply self-coaching strategies.   Therapist Response:  Pt engaged in discussion and reports struggle with allowing themselves rest.          Session Time: 11:00 -12:00   Participation Level: Active   Behavioral Response: CasualAlertDepressed   Type of Therapy: Group Therapy   Treatment Goals addressed: Coping   Progress Towards Goals: Progressing   Interventions: CBT, DBT, Solution Focused, Strength-based, Supportive, and Reframing   Summary: Cln continued discussion on rest and introduced the nine types of rest: time away, permission to not be helpful, something unproductive, connection to art and nature, solitude to recharge, break from responsibility, stillness to decompress, safe space, alone time at home. Group shared ways in which they can utilize each type of rest and which ones are most problematic for them.    Therapist Response: Pt engaged in discussion and discusses which type of rest is most problematic.          Session Time: 12:00 -1:00   Participation Level: Active   Behavioral Response: CasualAlertDepressed   Type of Therapy: Group therapy, Occupational Therapy   Treatment  Goals addressed: Coping   Progress Towards Goals: Progressing   Interventions: Supportive; Psychoeducation   Summary: 12:00 - 12:50: Occupational Therapy group led by cln E.  Hollan. 12:50 - 1:00 Clinician assessed for immediate needs, medication compliance and efficacy, and safety concerns.   Therapist Response: 12:00 - 12:50: Pt participated 12:50 - 1:00 pm: At check-out, patient reports no immediate concerns. Patient demonstrates progress as evidenced by continued engagement in group and responsiveness to treatment. Patient denies SI/HI/self-harm thoughts at the end of group.    Suicidal/Homicidal: Nowithout intent/plan  Plan: Pt will continue in PHP while working to decrease depression symptoms, increase support, and increase ability to manage symptoms in a healthy manner.   Collaboration of Care: Medication Management AEB A Pashayan  Patient/Guardian was advised Release of Information must be obtained prior to any record release in order to collaborate their care with an outside provider. Patient/Guardian was advised if they have not already done so to contact the registration department to sign all necessary forms in order for [name] to release information regarding their care.   Consent: Patient/Guardian gives verbal consent for treatment and assignment of benefits for services provided during this visit. Patient/Guardian expressed understanding and agreed to proceed.   Diagnosis: MDD (major depressive disorder), recurrent severe, without psychosis (HCC) [F33.2]    1. MDD (major depressive disorder), recurrent severe, without psychosis (HCC)   2. Generalized anxiety disorder      Rhonda Guiles, LCSW

## 2022-09-21 ENCOUNTER — Ambulatory Visit (HOSPITAL_COMMUNITY): Payer: Self-pay

## 2022-09-21 ENCOUNTER — Other Ambulatory Visit (HOSPITAL_COMMUNITY): Payer: Self-pay

## 2022-09-22 ENCOUNTER — Ambulatory Visit (HOSPITAL_COMMUNITY): Payer: Self-pay

## 2022-09-23 ENCOUNTER — Ambulatory Visit (HOSPITAL_COMMUNITY): Payer: Self-pay

## 2022-10-11 ENCOUNTER — Ambulatory Visit (INDEPENDENT_AMBULATORY_CARE_PROVIDER_SITE_OTHER): Payer: Medicaid Other | Admitting: Clinical

## 2022-10-11 DIAGNOSIS — F332 Major depressive disorder, recurrent severe without psychotic features: Secondary | ICD-10-CM
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
## 2022-10-27 ENCOUNTER — Encounter (HOSPITAL_COMMUNITY): Payer: Self-pay | Admitting: Psychiatry

## 2022-10-27 ENCOUNTER — Ambulatory Visit (INDEPENDENT_AMBULATORY_CARE_PROVIDER_SITE_OTHER): Payer: Medicaid Other | Admitting: Psychiatry

## 2022-10-27 DIAGNOSIS — F332 Major depressive disorder, recurrent severe without psychotic features: Secondary | ICD-10-CM

## 2022-10-27 DIAGNOSIS — F411 Generalized anxiety disorder: Secondary | ICD-10-CM | POA: Diagnosis not present

## 2022-10-27 DIAGNOSIS — G4700 Insomnia, unspecified: Secondary | ICD-10-CM

## 2022-10-27 MED ORDER — TRAZODONE HCL 50 MG PO TABS: ORAL_TABLET | ORAL | 2 refills | Status: DC

## 2022-10-27 MED ORDER — SERTRALINE HCL 100 MG PO TABS: 150.0000 mg | ORAL_TABLET | Freq: Every day | ORAL | 2 refills | Status: DC

## 2022-10-27 MED ORDER — HYDROXYZINE HCL 25 MG PO TABS: 25.0000 mg | ORAL_TABLET | Freq: Three times a day (TID) | ORAL | 2 refills | Status: DC | PRN
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
## 2022-11-08 ENCOUNTER — Ambulatory Visit (INDEPENDENT_AMBULATORY_CARE_PROVIDER_SITE_OTHER): Payer: Medicaid Other | Admitting: Clinical

## 2022-11-08 DIAGNOSIS — F332 Major depressive disorder, recurrent severe without psychotic features: Secondary | ICD-10-CM
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
## 2022-11-22 ENCOUNTER — Ambulatory Visit (INDEPENDENT_AMBULATORY_CARE_PROVIDER_SITE_OTHER): Payer: Medicaid Other | Admitting: Clinical

## 2022-11-22 DIAGNOSIS — F332 Major depressive disorder, recurrent severe without psychotic features: Secondary | ICD-10-CM

## 2022-11-22 NOTE — Progress Notes (Signed)
THERAPIST PROGRESS NOTE  Session Time: 60 minutes  Participation Level: Active  Behavioral Response: CasualAlertEuthymic  Type of Therapy: Individual Therapy  Treatment Goals addressed: client will participate in 80% of scheduled individual psychotherapy sessions  ProgressTowards Goals: Progressing  Interventions: CBT and Supportive  Summary:  Rhonda [name] is a 32 y.o. female who presents for the scheduled appointment oriented x 5, appropriately dressed, and friendly.  Client denied hallucinations and delusions. Client reported on today she is doing fairly well.  Client reported she decided to take a trip back to [place] as she needs to handle some personal affairs.  Client reported she will pop up to see her mom who does not know she is coming back into town.  Client reported she recently joined a Bible study that has been suggested to her by her mother sometime ago.  Client reported she had a good experience with interacting with people who were praying for her best interest.  Client reported she does continue to have days that are like a roller coaster and the other day she did have a panic attack and she is going to speak with the psychiatrist about some medication adjustments.  Client reported otherwise she has been having some things going going with her physical health and she is glad that she is making medical appointments to go get checked out.  Client reported over some time she has been seen by Dr. But not taking care of her health that she should.  Client reported her psychiatric medications has been causing her to have vivid dreams not necessarily categorized as nightmares.  Client reported within her relationship they are working on improving communication between the 2.  Client reported at times she does display behaviors that would or should push her away from being with her.  Client reported she is going to give it time to see how they grow together as they have only been  together for 4 months. Evidence of progress towards goal:  client reported 1 use of skill which is improving boundaries and assertive communication.    Suicidal/Homicidal: Nowithout intent/plan  Therapist Response:  Therapist began the appointment asking the client how she has been doing since last seen. Therapist used CBT to engage using active listening and positive emotional support. Therapist used CBT to engage the client and ask her to describe her medication client compared to side effects and ongoing symptoms that she is having. Therapist used CBT to ask the client open-ended questions about the severity of her anxiety and depressive symptoms compared to daily stressors. Therapist used CBT to positively reinforce the clients use of healthy communication skills and asserting boundaries when necessary. Therapist used CBT ask the client to identify her progress with frequency of use with coping skills with continued practice in her daily activity.    Therapist assigned the client homework to practice self-care, self soothing exercises, and healthy communication skills.    Plan: Return again in 3 weeks.  Diagnosis: major depressive disorder, recurrent episode, severe with anxious distress  Collaboration of Care: Patient refused AEB none requested by the client.  Patient/Guardian was advised Release of Information must be obtained prior to any record release in order to collaborate their care with an outside provider. Patient/Guardian was advised if they have not already done so to contact the registration department to sign all necessary forms in order for [name] to release information regarding their care.   Consent: Patient/Guardian gives verbal consent for treatment and assignment of benefits for  services provided during this visit. Patient/Guardian expressed understanding and agreed to proceed.   Camp Denn[place], LCSW 11/22/2022

## 2022-11-24 ENCOUNTER — Encounter (HOSPITAL_COMMUNITY): Payer: Self-pay | Admitting: Psychiatry

## 2022-11-24 ENCOUNTER — Ambulatory Visit (INDEPENDENT_AMBULATORY_CARE_PROVIDER_SITE_OTHER): Payer: Medicaid Other | Admitting: Psychiatry

## 2022-11-24 DIAGNOSIS — G4700 Insomnia, unspecified: Secondary | ICD-10-CM

## 2022-11-24 DIAGNOSIS — F332 Major depressive disorder, recurrent severe without psychotic features: Secondary | ICD-10-CM

## 2022-11-24 DIAGNOSIS — F411 Generalized anxiety disorder: Secondary | ICD-10-CM

## 2022-11-24 MED ORDER — TRAZODONE HCL 50 MG PO TABS: ORAL_TABLET | ORAL | 2 refills | Status: DC

## 2022-11-24 MED ORDER — SERTRALINE HCL 100 MG PO TABS: 200.0000 mg | ORAL_TABLET | Freq: Every day | ORAL | 2 refills | Status: DC

## 2022-11-24 MED ORDER — HYDROXYZINE HCL 25 MG PO TABS: 25.0000 mg | ORAL_TABLET | Freq: Three times a day (TID) | ORAL | 2 refills | Status: DC | PRN

## 2022-11-24 MED ORDER — QUETIAPINE FUMARATE 25 MG PO TABS: 25.0000 mg | ORAL_TABLET | Freq: Every day | ORAL | 2 refills | Status: DC
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
## 2022-11-25 ENCOUNTER — Telehealth (HOSPITAL_COMMUNITY): Payer: Self-pay

## 2022-11-25 NOTE — Telephone Encounter (Signed)
Medication management - Prior authorization for patient's prescribed Quetiapine 25 mg, one at bedtime completed online wiht CoverMyMeds and decision pending htis date.

## 2022-12-05 ENCOUNTER — Ambulatory Visit (INDEPENDENT_AMBULATORY_CARE_PROVIDER_SITE_OTHER): Payer: No Payment, Other | Admitting: Clinical

## 2022-12-05 DIAGNOSIS — F332 Major depressive disorder, recurrent severe without psychotic features: Secondary | ICD-10-CM | POA: Diagnosis not present

## 2022-12-11 NOTE — Progress Notes (Signed)
THERAPIST PROGRESS NOTE Virtual Visit via Video Note  I connected with Rhonda [name] on 12/05/2022 at  3:00 PM EST by a video enabled telemedicine application and verified that I am speaking with the correct person using two identifiers.  Location: Patient: home Provider: office   I discussed the limitations of evaluation and management by telemedicine and the availability of in person appointments. The patient expressed understanding and agreed to proceed.   Follow Up Instructions: I discussed the assessment and treatment plan with the patient. The patient was provided an opportunity to ask questions and all were answered. The patient agreed with the plan and demonstrated an understanding of the instructions.   The patient was advised to call back or seek an in-person evaluation if the symptoms worsen or if the condition fails to improve as anticipated.   Session Time: 45 minutes  Participation Level: Active  Behavioral Response: CasualAlertAnxious  Type of Therapy: Individual Therapy  Treatment Goals addressed: client will participate in 80% of scheduled individual psychotherapy sessions  ProgressTowards Goals: Progressing  Interventions: CBT and Supportive  Summary:  Rhonda [name] is a 32 y.o. female who presents for the scheduled appointment oriented times five, appropriately dressed, and friendly. Client denied hallucinations and delusions. Client reported on today she is doing fairly okay. Client reported she is feeling somewhat withdrawn from people in general and in her relationship. Client reported she joined the bible study again and a women prophesied to her. Client reported she told her about potential danger and described a few people in her life. Client reported that has caused her to think a lot. Client reported she also had a vivid dream about her brother who was killed. Client reported it scared her because she can remember every detail and what she asked him and they  were able to hug. Client reported she didn't know if it was the medication causing her to dream or not. Client reported the 3 year anniversary of his passing will be next month so that is probably why. Client reported she still harbors a lot of anger about her brothers passing that she holds back from talking about with others. Client reported she can understand that is contributing to her unexplained panic attacks and anxiety.  Evidence of progress towards goal:  client reported 1 negative emotion related to grief that contributes to her anxiety.   Suicidal/Homicidal: Nowithout intent/plan  Therapist Response:  Therapist began the appointment asking the client how she has been doing since last seen. Therapist used CBT to engage using active listening and positive emotional support. Therapist used CBT to engage and give the client time to discuss her thoughts and feelings over the past few weeks. Therapist used CBT to normalize the clients emotions and discuss stages of grief. Therapist used CBT ask the client to identify her progress with frequency of use with coping skills with continued practice in her daily activity.    Therapist assigned the client homework to try journaling her thoughts and feelings about anger pertaining to grief.   Plan: Return again in 4 weeks.  Diagnosis: major depressive disorder, recurrent episode, severe with anxious distress  Collaboration of Care: Patient refused AEB none requested by the client.  Patient/Guardian was advised Release of Information must be obtained prior to any record release in order to collaborate their care with an outside provider. Patient/Guardian was advised if they have not already done so to contact the registration department to sign all necessary forms in order for [name] to  release information regarding their care.   Consent: Patient/Guardian gives verbal consent for treatment and assignment of benefits for services provided during this  visit. Patient/Guardian expressed understanding and agreed to proceed.   [place], LCSW 12/05/2022

## 2022-12-22 ENCOUNTER — Ambulatory Visit (INDEPENDENT_AMBULATORY_CARE_PROVIDER_SITE_OTHER): Payer: Medicaid Other | Admitting: Psychiatry

## 2022-12-22 ENCOUNTER — Encounter (HOSPITAL_COMMUNITY): Payer: Self-pay | Admitting: Psychiatry

## 2022-12-22 DIAGNOSIS — F332 Major depressive disorder, recurrent severe without psychotic features: Secondary | ICD-10-CM

## 2022-12-22 DIAGNOSIS — F411 Generalized anxiety disorder: Secondary | ICD-10-CM

## 2022-12-22 MED ORDER — QUETIAPINE FUMARATE 25 MG PO TABS: 12.5000 mg | ORAL_TABLET | Freq: Two times a day (BID) | ORAL | 2 refills | Status: DC

## 2022-12-22 MED ORDER — QUETIAPINE FUMARATE 50 MG PO TABS: 50.0000 mg | ORAL_TABLET | Freq: Every day | ORAL | 2 refills | Status: DC

## 2022-12-22 MED ORDER — SERTRALINE HCL 100 MG PO TABS: 200.0000 mg | ORAL_TABLET | Freq: Every day | ORAL | 2 refills | Status: DC
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
## 2023-01-10 ENCOUNTER — Ambulatory Visit (HOSPITAL_COMMUNITY): Payer: Self-pay | Admitting: Clinical

## 2023-02-23 ENCOUNTER — Ambulatory Visit (INDEPENDENT_AMBULATORY_CARE_PROVIDER_SITE_OTHER): Payer: Medicaid Other | Admitting: Psychiatry

## 2023-02-23 VITALS — BP 107/94 | HR 94 | Wt 157.1 lb

## 2023-02-23 DIAGNOSIS — R030 Elevated blood-pressure reading, without diagnosis of hypertension: Secondary | ICD-10-CM | POA: Diagnosis not present

## 2023-02-23 DIAGNOSIS — F332 Major depressive disorder, recurrent severe without psychotic features: Secondary | ICD-10-CM | POA: Diagnosis not present

## 2023-02-23 DIAGNOSIS — F411 Generalized anxiety disorder: Secondary | ICD-10-CM

## 2023-02-23 MED ORDER — QUETIAPINE FUMARATE 25 MG PO TABS: 25.0000 mg | ORAL_TABLET | Freq: Two times a day (BID) | ORAL | 1 refills | Status: DC

## 2023-02-23 MED ORDER — QUETIAPINE FUMARATE 100 MG PO TABS: 100.0000 mg | ORAL_TABLET | Freq: Every day | ORAL | 1 refills | Status: DC

## 2023-02-23 MED ORDER — SERTRALINE HCL 100 MG PO TABS: 200.0000 mg | ORAL_TABLET | Freq: Every day | ORAL | 1 refills | Status: DC
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
## 2023-04-04 ENCOUNTER — Ambulatory Visit (HOSPITAL_COMMUNITY): Payer: MEDICAID | Admitting: Psychiatry

## 2023-04-04 ENCOUNTER — Encounter (HOSPITAL_COMMUNITY): Payer: Self-pay | Admitting: Psychiatry

## 2023-04-04 VITALS — BP 123/81 | HR 90 | Ht 65.0 in | Wt 166.2 lb

## 2023-04-04 DIAGNOSIS — F411 Generalized anxiety disorder: Secondary | ICD-10-CM

## 2023-04-04 DIAGNOSIS — F332 Major depressive disorder, recurrent severe without psychotic features: Secondary | ICD-10-CM

## 2023-04-04 DIAGNOSIS — F4310 Post-traumatic stress disorder, unspecified: Secondary | ICD-10-CM | POA: Diagnosis not present

## 2023-04-04 MED ORDER — SERTRALINE HCL 100 MG PO TABS: 200.0000 mg | ORAL_TABLET | Freq: Every day | ORAL | 2 refills | Status: DC

## 2023-04-04 MED ORDER — QUETIAPINE FUMARATE 100 MG PO TABS: 100.0000 mg | ORAL_TABLET | Freq: Every day | ORAL | 2 refills | Status: DC

## 2023-04-04 MED ORDER — QUETIAPINE FUMARATE 25 MG PO TABS: 25.0000 mg | ORAL_TABLET | Freq: Two times a day (BID) | ORAL | 2 refills | Status: DC

## 2023-04-04 MED ORDER — PRAZOSIN HCL 1 MG PO CAPS: ORAL_CAPSULE | ORAL | 2 refills | Status: DC

## 2023-04-04 NOTE — Patient Instructions (Signed)
Thank you for attending your appointment today.  -- START Prazosin 1 mg nightly; if tolerating well after 1 week increase to 1 mg twice daily -- Continue other medications as prescribed.  Please do not make any changes to medications without first discussing with your provider. If you are experiencing a psychiatric emergency, please call 911 or present to your nearest emergency department. Additional crisis, medication management, and therapy resources are included below.  [facility]  [address] [place], [place] [address] [phone] WALK-IN URGENT CARE 24/7 FOR ANYONE [address], [place], [place]  [phone] Fax: [phone] [email or web address] *Interpreters available *Accepts all insurance and uninsured for Urgent Care needs *Accepts Medicaid and uninsured for outpatient treatment (below)      ONLY FOR [facility]  Below:    Outpatient New Patient Assessment/Therapy Walk-ins:        Monday -Thursday 8am until slots are full.        Every Friday 1pm-4pm  (first come, first served)                   New Patient Psychiatry/Medication Management        Monday-Friday 8am-11am (first come, first served)               For all walk-ins we ask that you arrive by 7:15am, because patients will be seen in the order of arrival.

## 2023-05-12 ENCOUNTER — Telehealth (HOSPITAL_COMMUNITY): Payer: Self-pay | Admitting: Clinical

## 2023-05-12 ENCOUNTER — Encounter (HOSPITAL_COMMUNITY): Payer: Self-pay

## 2023-05-12 ENCOUNTER — Ambulatory Visit (HOSPITAL_COMMUNITY): Payer: MEDICAID | Admitting: Clinical

## 2023-05-12 NOTE — Telephone Encounter (Signed)
Therapist sent the client a link to all 3 numbers on file. Client did not check in using the link. Therapist called the number on file. Client phone stated it was an inactive number. Therapist was unable to leave a message.

## 2023-05-15 ENCOUNTER — Ambulatory Visit (INDEPENDENT_AMBULATORY_CARE_PROVIDER_SITE_OTHER): Payer: MEDICAID | Admitting: Clinical

## 2023-05-15 DIAGNOSIS — F332 Major depressive disorder, recurrent severe without psychotic features: Secondary | ICD-10-CM
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
## 2023-05-24 ENCOUNTER — Telehealth (HOSPITAL_COMMUNITY): Payer: MEDICAID | Admitting: Psychiatry

## 2023-05-24 ENCOUNTER — Encounter (HOSPITAL_COMMUNITY): Payer: Self-pay | Admitting: Psychiatry

## 2023-05-24 DIAGNOSIS — F331 Major depressive disorder, recurrent, moderate: Secondary | ICD-10-CM | POA: Diagnosis not present

## 2023-05-24 DIAGNOSIS — F411 Generalized anxiety disorder: Secondary | ICD-10-CM

## 2023-05-24 DIAGNOSIS — F4310 Post-traumatic stress disorder, unspecified: Secondary | ICD-10-CM

## 2023-05-24 MED ORDER — QUETIAPINE FUMARATE 200 MG PO TABS: 200.0000 mg | ORAL_TABLET | Freq: Every day | ORAL | 2 refills | Status: DC

## 2023-05-24 MED ORDER — PRAZOSIN HCL 1 MG PO CAPS
1.0000 mg | ORAL_CAPSULE | Freq: Two times a day (BID) | ORAL | 2 refills | Status: DC
Start: 2023-05-24 — End: 2023-07-19

## 2023-05-24 MED ORDER — SERTRALINE HCL 100 MG PO TABS: 200.0000 mg | ORAL_TABLET | Freq: Every day | ORAL | 2 refills | Status: DC

## 2023-05-24 MED ORDER — QUETIAPINE FUMARATE 25 MG PO TABS: 25.0000 mg | ORAL_TABLET | Freq: Two times a day (BID) | ORAL | 2 refills | Status: DC

## 2023-05-24 NOTE — Patient Instructions (Signed)
Thank you for attending your appointment today.  -- INCREASE nighttime Seroquel to 150 mg for 3-4 days and then to 200 mg nightly thereafter -- Continue other medications as prescribed.  Please do not make any changes to medications without first discussing with your provider. If you are experiencing a psychiatric emergency, please call 911 or present to your nearest emergency department. Additional crisis, medication management, and therapy resources are included below.  [facility]  [address], [place], [place] [address] [phone] WALK-IN URGENT CARE 24/7 FOR ANYONE [address] [place], [place]  [phone] Fax: [phone] [email or web address] *Interpreters available *Accepts all insurance and uninsured for Urgent Care needs *Accepts Medicaid and uninsured for outpatient treatment (below)      ONLY FOR [facility]  Below:    Outpatient New Patient Assessment/Therapy Walk-ins:        Monday -Thursday 8am until slots are full.        Every Friday 1pm-4pm  (first come, first served)                   New Patient Psychiatry/Medication Management        Monday-Friday 8am-11am (first come, first served)               For all walk-ins we ask that you arrive by 7:15am, because patients will be seen in the order of arrival.

## 2023-06-15 ENCOUNTER — Ambulatory Visit (INDEPENDENT_AMBULATORY_CARE_PROVIDER_SITE_OTHER): Payer: MEDICAID | Admitting: Clinical

## 2023-06-15 DIAGNOSIS — F411 Generalized anxiety disorder: Secondary | ICD-10-CM | POA: Diagnosis not present
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
## 2023-07-06 ENCOUNTER — Ambulatory Visit (INDEPENDENT_AMBULATORY_CARE_PROVIDER_SITE_OTHER): Payer: MEDICAID | Admitting: Clinical

## 2023-07-06 DIAGNOSIS — F411 Generalized anxiety disorder: Secondary | ICD-10-CM | POA: Diagnosis not present
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
## 2023-07-13 ENCOUNTER — Other Ambulatory Visit: Payer: Self-pay | Admitting: Medical Genetics

## 2023-07-13 DIAGNOSIS — Z006 Encounter for examination for normal comparison and control in clinical research program: Secondary | ICD-10-CM
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
## 2023-07-19 ENCOUNTER — Encounter (HOSPITAL_COMMUNITY): Payer: Self-pay | Admitting: Psychiatry

## 2023-07-19 ENCOUNTER — Telehealth (INDEPENDENT_AMBULATORY_CARE_PROVIDER_SITE_OTHER): Payer: MEDICAID | Admitting: Psychiatry

## 2023-07-19 DIAGNOSIS — F411 Generalized anxiety disorder: Secondary | ICD-10-CM | POA: Diagnosis not present

## 2023-07-19 DIAGNOSIS — F332 Major depressive disorder, recurrent severe without psychotic features: Secondary | ICD-10-CM | POA: Diagnosis not present

## 2023-07-19 DIAGNOSIS — F4310 Post-traumatic stress disorder, unspecified: Secondary | ICD-10-CM

## 2023-07-19 DIAGNOSIS — F1390 Sedative, hypnotic, or anxiolytic use, unspecified, uncomplicated: Secondary | ICD-10-CM

## 2023-07-19 MED ORDER — HYDROXYZINE HCL 25 MG PO TABS: 25.0000 mg | ORAL_TABLET | Freq: Two times a day (BID) | ORAL | 1 refills | Status: DC | PRN

## 2023-07-19 MED ORDER — VENLAFAXINE HCL ER 75 MG PO CP24: 75.0000 mg | ORAL_CAPSULE | Freq: Every day | ORAL | 1 refills | Status: DC

## 2023-07-19 MED ORDER — QUETIAPINE FUMARATE 25 MG PO TABS: 25.0000 mg | ORAL_TABLET | Freq: Two times a day (BID) | ORAL | 2 refills | Status: DC

## 2023-07-19 MED ORDER — QUETIAPINE FUMARATE 200 MG PO TABS: 200.0000 mg | ORAL_TABLET | Freq: Every day | ORAL | 2 refills | Status: DC

## 2023-07-19 NOTE — Patient Instructions (Addendum)
Thank you for attending your appointment today.  -- SWITCH from Zoloft to Effexor per instructions below -- Week 1: decrease Zoloft to 150 mg daily -- Week 2: decrease Zoloft to 100 mg daily -- Week 3: decrease Zoloft to 50 mg daily; START Effexor 75 mg daily -- Week 4: STOP Zoloft -- START hydroxyzine 25 mg BID PRN anxiety/itching -- Continue other medications as prescribed.  Please do not make any changes to medications without first discussing with your provider. If you are experiencing a psychiatric emergency, please call 911 or present to your nearest emergency department. Additional crisis, medication management, and therapy resources are included below.  [facility]  [address], [place], [place] [address] [phone] WALK-IN URGENT CARE 24/7 FOR ANYONE [address], [place], [place]  [phone] Fax: [phone] [email or web address] *Interpreters available *Accepts all insurance and uninsured for Urgent Care needs *Accepts Medicaid and uninsured for outpatient treatment (below)      ONLY FOR [facility]  Below:    Outpatient New Patient Assessment/Therapy Walk-ins:        Monday -Thursday 8am until slots are full.        Every Friday 1pm-4pm  (first come, first served)                   New Patient Psychiatry/Medication Management        Monday-Friday 8am-11am (first come, first served)               For all walk-ins we ask that you arrive by 7:15am, because patients will be seen in the order of arrival.

## 2023-07-27 ENCOUNTER — Ambulatory Visit (INDEPENDENT_AMBULATORY_CARE_PROVIDER_SITE_OTHER): Payer: MEDICAID | Admitting: Clinical

## 2023-07-27 DIAGNOSIS — F411 Generalized anxiety disorder: Secondary | ICD-10-CM | POA: Diagnosis not present
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
## 2023-08-02 ENCOUNTER — Telehealth (HOSPITAL_COMMUNITY): Payer: Self-pay | Admitting: *Deleted

## 2023-08-02 NOTE — Telephone Encounter (Signed)
Fax received for approval of Quetiapine Fumarate 25mg . Called to notify pharmacy.

## 2023-08-10 ENCOUNTER — Ambulatory Visit (INDEPENDENT_AMBULATORY_CARE_PROVIDER_SITE_OTHER): Payer: MEDICAID | Admitting: Clinical

## 2023-08-10 DIAGNOSIS — F411 Generalized anxiety disorder: Secondary | ICD-10-CM | POA: Diagnosis not present
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
## 2023-08-24 ENCOUNTER — Ambulatory Visit (HOSPITAL_COMMUNITY): Payer: MEDICAID | Admitting: Clinical

## 2023-08-24 DIAGNOSIS — F411 Generalized anxiety disorder: Secondary | ICD-10-CM
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
## 2023-09-06 ENCOUNTER — Telehealth (HOSPITAL_COMMUNITY): Payer: MEDICAID | Admitting: Psychiatry

## 2023-09-06 ENCOUNTER — Encounter (HOSPITAL_COMMUNITY): Payer: Self-pay | Admitting: Psychiatry

## 2023-09-06 DIAGNOSIS — F332 Major depressive disorder, recurrent severe without psychotic features: Secondary | ICD-10-CM

## 2023-09-06 DIAGNOSIS — F411 Generalized anxiety disorder: Secondary | ICD-10-CM

## 2023-09-06 DIAGNOSIS — F4310 Post-traumatic stress disorder, unspecified: Secondary | ICD-10-CM | POA: Diagnosis not present

## 2023-09-06 MED ORDER — QUETIAPINE FUMARATE 200 MG PO TABS: 200.0000 mg | ORAL_TABLET | Freq: Every day | ORAL | 2 refills | Status: DC

## 2023-09-06 MED ORDER — QUETIAPINE FUMARATE 25 MG PO TABS: 25.0000 mg | ORAL_TABLET | Freq: Two times a day (BID) | ORAL | 2 refills | Status: DC

## 2023-09-06 NOTE — Patient Instructions (Signed)
Thank you for attending your appointment today.  -- Do NOT restart Effexor. -- The medication we discussed today is called Prozac and we can discuss this more at your next visit.  -- Continue other medications as prescribed.  Please do not make any changes to medications without first discussing with your provider. If you are experiencing a psychiatric emergency, please call 911 or present to your nearest emergency department. Additional crisis, medication management, and therapy resources are included below.  [facility]  [address], [place], [place] [address] [phone] WALK-IN URGENT CARE 24/7 FOR ANYONE [address], [place], [place]  [phone] Fax: [phone] [email or web address] *Interpreters available *Accepts all insurance and uninsured for Urgent Care needs *Accepts Medicaid and uninsured for outpatient treatment (below)      ONLY FOR [facility]  Below:    Outpatient New Patient Assessment/Therapy Walk-ins:        Monday, Wednesday, and Thursday 8am until slots are full (first come, first served)                   New Patient Psychiatry/Medication Management        Monday-Friday 8am-11am (first come, first served)               For all walk-ins we ask that you arrive by 7:15am, because patients will be seen in the order of arrival.

## 2023-09-07 ENCOUNTER — Ambulatory Visit (INDEPENDENT_AMBULATORY_CARE_PROVIDER_SITE_OTHER): Payer: MEDICAID | Admitting: Clinical

## 2023-09-07 ENCOUNTER — Telehealth (HOSPITAL_COMMUNITY): Payer: Self-pay | Admitting: Clinical

## 2023-09-07 DIAGNOSIS — F332 Major depressive disorder, recurrent severe without psychotic features: Secondary | ICD-10-CM

## 2023-09-07 NOTE — Progress Notes (Signed)
Client cancelled the ppointment.

## 2023-09-07 NOTE — Telephone Encounter (Signed)
Therapist sent the client a link via caregility for the scheduled virtual therapy appointment. Client did not check in using the link. Therapist attempted a phone call to the client. Client did not answer. Therapist left a voicemail for the client to call the office to reschedule the appointment.

## 2023-09-21 ENCOUNTER — Ambulatory Visit (HOSPITAL_COMMUNITY): Payer: MEDICAID | Admitting: Clinical

## 2023-09-21 ENCOUNTER — Encounter (HOSPITAL_COMMUNITY): Payer: Self-pay

## 2023-10-17 ENCOUNTER — Telehealth (HOSPITAL_COMMUNITY): Payer: Self-pay

## 2023-10-17 NOTE — Telephone Encounter (Signed)
 PT called to schedule a therapy appointment - I had to inform the PT that due to no shows - I am unable to schedule the appointment due to prior no shows - PT then said she never scheduled the last no show appt - I informed her I apologize but due to policy she would have to utilize our walk in hours - PT then called me a  weird bitch before hanging up.

## 2023-10-25 ENCOUNTER — Encounter (HOSPITAL_COMMUNITY): Payer: Self-pay

## 2023-10-25 ENCOUNTER — Ambulatory Visit (INDEPENDENT_AMBULATORY_CARE_PROVIDER_SITE_OTHER): Payer: MEDICAID | Admitting: Clinical

## 2023-10-25 DIAGNOSIS — F332 Major depressive disorder, recurrent severe without psychotic features: Secondary | ICD-10-CM | POA: Diagnosis not present
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
## 2023-11-01 ENCOUNTER — Encounter (HOSPITAL_COMMUNITY): Payer: Self-pay | Admitting: Psychiatry

## 2023-11-01 ENCOUNTER — Telehealth (HOSPITAL_COMMUNITY): Payer: MEDICAID | Admitting: Psychiatry

## 2023-11-01 DIAGNOSIS — F411 Generalized anxiety disorder: Secondary | ICD-10-CM | POA: Diagnosis not present

## 2023-11-01 DIAGNOSIS — F332 Major depressive disorder, recurrent severe without psychotic features: Secondary | ICD-10-CM

## 2023-11-01 DIAGNOSIS — F4310 Post-traumatic stress disorder, unspecified: Secondary | ICD-10-CM | POA: Diagnosis not present

## 2023-11-01 DIAGNOSIS — F1290 Cannabis use, unspecified, uncomplicated: Secondary | ICD-10-CM

## 2023-11-01 DIAGNOSIS — F1390 Sedative, hypnotic, or anxiolytic use, unspecified, uncomplicated: Secondary | ICD-10-CM

## 2023-11-01 MED ORDER — QUETIAPINE FUMARATE 25 MG PO TABS: 25.0000 mg | ORAL_TABLET | Freq: Two times a day (BID) | ORAL | 2 refills | Status: DC

## 2023-11-01 MED ORDER — FLUOXETINE HCL 10 MG PO CAPS: 10.0000 mg | ORAL_CAPSULE | Freq: Every day | ORAL | 1 refills | Status: DC

## 2023-11-01 MED ORDER — GABAPENTIN 100 MG PO CAPS: 100.0000 mg | ORAL_CAPSULE | Freq: Two times a day (BID) | ORAL | 1 refills | Status: DC | PRN

## 2023-11-01 MED ORDER — QUETIAPINE FUMARATE 200 MG PO TABS: 200.0000 mg | ORAL_TABLET | Freq: Every day | ORAL | 2 refills | Status: DC

## 2023-11-01 NOTE — Patient Instructions (Signed)
Thank you for attending your appointment today.  -- START Prozac 10 mg daily -- START gabapentin 100 mg up to twice daily as needed for severe anxiety -- Continue other medications as prescribed.  Please do not make any changes to medications without first discussing with your provider. If you are experiencing a psychiatric emergency, please call 911 or present to your nearest emergency department. Additional crisis, medication management, and therapy resources are included below.  [facility]  [address] [place], [place] [address] [phone] WALK-IN URGENT CARE 24/7 FOR ANYONE [address] [place], [place]  [phone] Fax: [phone] [email or web address] *Interpreters available *Accepts all insurance and uninsured for Urgent Care needs *Accepts Medicaid and uninsured for outpatient treatment (below)      ONLY FOR [facility]  Below:    Outpatient New Patient Assessment/Therapy Walk-ins:        Monday, Wednesday, and Thursday 8am until slots are full (first come, first served)                   New Patient Psychiatry/Medication Management        Monday-Friday 8am-11am (first come, first served)               For all walk-ins we ask that you arrive by 7:15am, because patients will be seen in the order of arrival.
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
## 2023-11-08 ENCOUNTER — Ambulatory Visit (HOSPITAL_COMMUNITY): Payer: MEDICAID | Admitting: Clinical

## 2023-11-08 DIAGNOSIS — F331 Major depressive disorder, recurrent, moderate: Secondary | ICD-10-CM
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
## 2023-11-22 ENCOUNTER — Ambulatory Visit (INDEPENDENT_AMBULATORY_CARE_PROVIDER_SITE_OTHER): Payer: MEDICAID | Admitting: Clinical

## 2023-11-22 DIAGNOSIS — F331 Major depressive disorder, recurrent, moderate: Secondary | ICD-10-CM

## 2023-11-22 NOTE — Progress Notes (Signed)
 THERAPIST PROGRESS NOTE Virtual Visit via Video Note  I connected with Rhonda [name] on 11/22/2023 at  1:00 PM EST by a video enabled telemedicine application and verified that I am speaking with the correct person using two identifiers.  Location: Patient: home Provider: office   I discussed the limitations of evaluation and management by telemedicine and the availability of in person appointments. The patient expressed understanding and agreed to proceed.   Follow Up Instructions: I discussed the assessment and treatment plan with the patient. The patient was provided an opportunity to ask questions and all were answered. The patient agreed with the plan and demonstrated an understanding of the instructions.   The patient was advised to call back or seek an in-person evaluation if the symptoms worsen or if the condition fails to improve as anticipated.   Session Time: 45 minutes  Participation Level: Active  Behavioral Response: CasualAlertDepressed  Type of Therapy: Individual Therapy  Treatment Goals addressed: Kanna WILL PARTICIPATE IN AT LEAST 80% OF SCHEDULED INDIVIDUAL PSYCHOTHERAPY SESSIONS   ProgressTowards Goals: Progressing  Interventions: CBT  Summary:  Rhonda [name] is a 33 y.o. female who presents for scheduled appointment oriented x 5, appropriately dressed, and friendly.  Client denied hallucinations and delusions. Client reported on today she is doing pretty well.  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  Client reported he was showing signs of dehydration and instructed his son to call the ambulance so he could be treated at the hospital.  Client reported she heard he is doing fine now.  Client reported she has had some depression and she thinks that is stemmed from continuing to worry about her other family members such as her mom and her niece who is the daughter of her  deceased brother going through hardships.  Client reported otherwise she is continue to go to the gym frequently and eating a better diet.  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  Client reported she still has intrusive thoughts about the past and the trauma that she has been through but feels she is in a much better space to actively work through those things as they come to her for her friend.  Client reported she does take time to do nice things for herself. Evidence of progress towards goal: Client reported 2 positives of engaging in physical activity such as going to the gym as well as positively communicating her thoughts and feelings about triggers for her emotions.  Suicidal/Homicidal: Nowithout intent/plan  Therapist Response:  Therapist began the appointment asking the client how she has been doing since last seen. Therapist used CBT to engage with active listening and positive emotional support. Therapist used CBT to ask the client to discuss changes in her thoughts and feelings that have been occurring since the last session. Therapist used CBT to normalize her emotional response and engage with her discussing boundaries, mindfulness skills to help alleviate severity of negative emotions and thoughts. Therapist used CBT ask the client to identify her progress with frequency of use with coping skills with continued practice in her daily activity.       Plan: Return again in 4 weeks.  Diagnosis: moderate episode of recurrent major depressive disorder  Collaboration of Care: Patient refused AEB none requested by the client.  Patient/Guardian was advised Release of Information must be obtained prior to any record release  in order to collaborate their care with an outside provider. Patient/Guardian was advised if they have not already done so to contact the registration department to sign all necessary forms in order for [name] to release information regarding  their care.   Consent: Patient/Guardian gives verbal consent for treatment and assignment of benefits for services provided during this visit. Patient/Guardian expressed understanding and agreed to proceed.   Rhonda Rhymes Findley Vi, LCSW 11/22/2023

## 2023-11-29 ENCOUNTER — Ambulatory Visit (INDEPENDENT_AMBULATORY_CARE_PROVIDER_SITE_OTHER): Payer: MEDICAID | Admitting: Clinical

## 2023-11-29 ENCOUNTER — Other Ambulatory Visit (HOSPITAL_COMMUNITY): Payer: Self-pay | Admitting: Physician Assistant

## 2023-11-29 DIAGNOSIS — H9312 Tinnitus, left ear: Secondary | ICD-10-CM

## 2023-11-29 DIAGNOSIS — F331 Major depressive disorder, recurrent, moderate: Secondary | ICD-10-CM

## 2023-12-04 ENCOUNTER — Encounter (HOSPITAL_COMMUNITY): Payer: Self-pay

## 2023-12-04 ENCOUNTER — Ambulatory Visit (HOSPITAL_COMMUNITY): Payer: MEDICAID | Admitting: Clinical

## 2023-12-08 ENCOUNTER — Encounter (HOSPITAL_COMMUNITY): Payer: Self-pay

## 2023-12-08 ENCOUNTER — Ambulatory Visit (HOSPITAL_COMMUNITY): Payer: MEDICAID

## 2023-12-09 NOTE — Progress Notes (Signed)
 THERAPIST PROGRESS NOTE Virtual Visit via Video Note  I connected with Rhonda [name] on 11/29/2023 at  2:00 PM EST by a video enabled telemedicine application and verified that I am speaking with the correct person using two identifiers.  Location: Patient: home Provider: office   I discussed the limitations of evaluation and management by telemedicine and the availability of in person appointments. The patient expressed understanding and agreed to proceed.   Follow Up Instructions: I discussed the assessment and treatment plan with the patient. The patient was provided an opportunity to ask questions and all were answered. The patient agreed with the plan and demonstrated an understanding of the instructions.   The patient was advised to call [name] or seek an in-person evaluation if the symptoms worsen or if the condition fails to improve as anticipated.   Session Time: 45 minutes  Participation Level: Active  Behavioral Response: CasualAlertDepressed  Type of Therapy: Individual Therapy  Treatment Goals addressed:  [name] WILL PARTICIPATE IN AT LEAST 80% OF SCHEDULED INDIVIDUAL PSYCHOTHERAPY SESSIONS   ProgressTowards Goals: Progressing  Interventions: CBT and Supportive  Summary:  Rhonda [name] is a 33 y.o. female who presents for the scheduled appointment oriented x 5, appropriately dressed, and friendly.  Client denied hallucinations and delusions. Client reported she went to go see a ear nose and throat doctor on today to see if anything else can be ruled out for her frequent headaches.  Client reported she states that she will have more follow-up appointments to come in for that issue.  Client reported otherwise she has trying to not sit to herself and get deep into her thoughts.  Client reported she has a lot of worries about her family and it frustrates her that she cannot be present for them as she wants was.  Client reported the man that she has been helping to take care of  is still in hospital services so she has somewhat of a break.  Client reported she has been keeping herself busy by going to the gym when she can and face timing with her family when she is able to is well. Evidence of progress towards goal: Client reported 1 positive of keeping herself positive the engaged with activities outside of the home.  Suicidal/Homicidal: Nowithout intent/plan  Therapist Response:  Therapist began the appointment asking the client how she has been doing since last seen. Therapist used CBT to engage with active listening and positive emotional support. Therapist used CBT to ask the client about current severity of depressive symptoms and contributing factors that she has previously noted and how she is coping with it. Therapist used CBT to normalize the clients concerns and engage with her to reframe some of her negative perspectives. Therapist used CBT to offer the client some advisement on how to communicate some of her concerns with her family members. Therapist used CBT ask the client to identify her progress with frequency of use with coping skills with continued practice in her daily activity.    Therapist assigned client homework to practice self-care   Plan: Return again in 3 weeks.  Diagnosis: Moderate episode of recurrent major depressive disorder  Collaboration of Care: Patient refused AEB none requested by the client.  Patient/Guardian was advised Release of Information must be obtained prior to any record release in order to collaborate their care with an outside provider. Patient/Guardian was advised if they have not already done so to contact the registration department to sign all necessary forms in order  for [name] to release information regarding their care.   Consent: Patient/Guardian gives verbal consent for treatment and assignment of benefits for services provided during this visit. Patient/Guardian expressed understanding and agreed to proceed.    [name] Brittie Whisnant, LCSW 11/29/2023
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
## 2023-12-13 ENCOUNTER — Telehealth (HOSPITAL_COMMUNITY): Payer: MEDICAID | Admitting: Psychiatry

## 2023-12-13 ENCOUNTER — Ambulatory Visit (HOSPITAL_COMMUNITY): Payer: MEDICAID | Admitting: Clinical

## 2023-12-13 ENCOUNTER — Encounter (HOSPITAL_COMMUNITY): Payer: Self-pay | Admitting: Psychiatry

## 2023-12-13 DIAGNOSIS — F1290 Cannabis use, unspecified, uncomplicated: Secondary | ICD-10-CM

## 2023-12-13 DIAGNOSIS — F1390 Sedative, hypnotic, or anxiolytic use, unspecified, uncomplicated: Secondary | ICD-10-CM

## 2023-12-13 DIAGNOSIS — F332 Major depressive disorder, recurrent severe without psychotic features: Secondary | ICD-10-CM | POA: Diagnosis not present

## 2023-12-13 DIAGNOSIS — F4310 Post-traumatic stress disorder, unspecified: Secondary | ICD-10-CM | POA: Diagnosis not present

## 2023-12-13 DIAGNOSIS — F411 Generalized anxiety disorder: Secondary | ICD-10-CM

## 2023-12-13 MED ORDER — GABAPENTIN 100 MG PO CAPS: 100.0000 mg | ORAL_CAPSULE | Freq: Two times a day (BID) | ORAL | 1 refills | Status: DC | PRN

## 2023-12-13 MED ORDER — QUETIAPINE FUMARATE 200 MG PO TABS: 200.0000 mg | ORAL_TABLET | Freq: Every day | ORAL | 2 refills | Status: DC

## 2023-12-13 MED ORDER — FLUOXETINE HCL 10 MG PO CAPS: 10.0000 mg | ORAL_CAPSULE | Freq: Every day | ORAL | 1 refills | Status: DC

## 2023-12-13 MED ORDER — QUETIAPINE FUMARATE 25 MG PO TABS: 25.0000 mg | ORAL_TABLET | Freq: Two times a day (BID) | ORAL | 2 refills | Status: DC

## 2023-12-13 NOTE — Patient Instructions (Signed)
 Thank you for attending your appointment today.  -- START Prozac 10 mg daily and gabapentin 100 mg twice daily as needed for anxiety as previously discussed -- Continue other medications as prescribed.  Please do not make any changes to medications without first discussing with your provider. If you are experiencing a psychiatric emergency, please call 911 or present to your nearest emergency department. Additional crisis, medication management, and therapy resources are included below.  [facility]  [address], [place], [place] [address] [phone] WALK-IN URGENT CARE 24/7 FOR ANYONE [address], [place], [place]  [phone] Fax: [phone] [email or web address] *Interpreters available *Accepts all insurance and uninsured for Urgent Care needs *Accepts Medicaid and uninsured for outpatient treatment (below)      ONLY FOR [facility]  Below:    Outpatient New Patient Assessment/Therapy Walk-ins:        Monday, Wednesday, and Thursday 8am until slots are full (first come, first served)                   New Patient Psychiatry/Medication Management        Monday-Friday 8am-11am (first come, first served)               For all walk-ins we ask that you arrive by 7:15am, because patients will be seen in the order of arrival.

## 2023-12-27 ENCOUNTER — Ambulatory Visit (HOSPITAL_COMMUNITY): Payer: MEDICAID | Admitting: Clinical

## 2023-12-27 DIAGNOSIS — F331 Major depressive disorder, recurrent, moderate: Secondary | ICD-10-CM

## 2023-12-29 NOTE — Progress Notes (Signed)
 THERAPIST PROGRESS NOTE Virtual Visit via Video Note  I connected with Rhonda [name] on 12/27/2023 at  4:00 PM EDT by a video enabled telemedicine application and verified that I am speaking with the correct person using two identifiers.  Location: Patient: home Provider: office   I discussed the limitations of evaluation and management by telemedicine and the availability of in person appointments. The patient expressed understanding and agreed to proceed.   Follow Up Instructions: I discussed the assessment and treatment plan with the patient. The patient was provided an opportunity to ask questions and all were answered. The patient agreed with the plan and demonstrated an understanding of the instructions.   The patient was advised to call back or seek an in-person evaluation if the symptoms worsen or if the condition fails to improve as anticipated.   Session Time: 50 minutes  Participation Level: Active  Behavioral Response: CasualAlertDepressed  Type of Therapy: Individual Therapy  Treatment Goals addressed: Patient will participate in at least 80% of scheduled individual psychotherapy sessions   ProgressTowards Goals: Progressing  Interventions: CBT  Summary:  Rhonda [name] is a 33 y.o. female who presents the scheduled appointment oriented x 5, appropriately dressed, and friendly.  Client denied hallucinations and delusions. Client reported today she has been struggling with negative emotions of depression and irritability.  Client reported therapy has been ongoing outside of her within her family that she has her ongoing concerns about.  Client reported some of those things take her back to triggers from her childhood that still affect her.  Client reported she finds that also in her daily interactions she tries to be mindful of her response to things because it can be triggering.  Client reported she knows she needs to take deeper to address some things from childhood but  she does not know where she wants to start with that.  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  Client reported the man's health is declining.  Client reported his alcoholic son has hateful things towards his father and continues to display the same behaviors that he has always had dealing with his own sickness. Evidence of progress towards goal: Client reported 1 positive of continuing to work on decreasing the longevity of depressive episodes but engaging in at least 1 positive behavioral activation techniques such as meditation.  Suicidal/Homicidal: Nowithout intent/plan  Therapist Response:  Therapist began the appointment asking the client how she has been doing since last seen. Therapist used CBT to engage with active listening and positive emotional support. Therapist used CBT to ask the client open-ended questions about factors that have been contributing to her negative mood. Therapist used CBT to normalize her emotional responses within reason. Therapist used CBT to teach client about positive coping mechanisms such as guided meditation. Therapist used CBT ask the client to identify her progress with frequency of use with coping skills with continued practice in her daily activity.    Therapist assigned client homework to practice the skills discussed.   Plan: Return again in 3 weeks.  Diagnosis: Moderate episode of recurrent major depressive disorder  Collaboration of Care: Patient refused AEB none requested by the client.  Patient/Guardian was advised Release of Information must be obtained prior to any record release in order to collaborate their care with an outside provider. Patient/Guardian was advised if they have not already done so to contact the registration department to sign  all necessary forms in order for [name] to release information regarding their care.   Consent: Patient/Guardian gives verbal  consent for treatment and assignment of benefits for services provided during this visit. Patient/Guardian expressed understanding and agreed to proceed.   [name] Callista Hoh, LCSW 12/27/2023

## 2023-12-31 ENCOUNTER — Encounter (HOSPITAL_COMMUNITY): Payer: Self-pay

## 2023-12-31 ENCOUNTER — Emergency Department (HOSPITAL_COMMUNITY)
Admission: EM | Admit: 2023-12-31 | Discharge: 2023-12-31 | Disposition: A | Discharge status: Home / Self Care | Payer: MEDICAID | Attending: Emergency Medicine | Admitting: Emergency Medicine

## 2023-12-31 ENCOUNTER — Other Ambulatory Visit: Payer: Self-pay

## 2023-12-31 DIAGNOSIS — K219 Gastro-esophageal reflux disease without esophagitis: Secondary | ICD-10-CM | POA: Diagnosis not present

## 2023-12-31 DIAGNOSIS — R112 Nausea with vomiting, unspecified: Secondary | ICD-10-CM | POA: Diagnosis present

## 2023-12-31 DIAGNOSIS — D72829 Elevated white blood cell count, unspecified: Secondary | ICD-10-CM | POA: Diagnosis not present

## 2023-12-31 LAB — COMPREHENSIVE METABOLIC PANEL WITH GFR
ALT: 20 U/L (ref 0–44)
AST: 28 U/L (ref 15–41)
Albumin: 4.3 g/dL (ref 3.5–5.0)
Alkaline Phosphatase: 58 U/L (ref 38–126)
Anion gap: 9 (ref 5–15)
BUN: 14 mg/dL (ref 6–20)
CO2: 25 mmol/L (ref 22–32)
Calcium: 9.1 mg/dL (ref 8.9–10.3)
Chloride: 105 mmol/L (ref 98–111)
Creatinine, Ser: 1.07 mg/dL — ABNORMAL HIGH (ref 0.44–1.00)
GFR, Estimated: >60 mL/min
Glucose, Bld: 104 mg/dL — ABNORMAL HIGH (ref 70–99)
Potassium: 4.3 mmol/L (ref 3.5–5.1)
Sodium: 139 mmol/L (ref 135–145)
Total Bilirubin: 0.5 mg/dL (ref 0.0–1.2)
Total Protein: 7.7 g/dL (ref 6.5–8.1)

## 2023-12-31 LAB — LIPASE, BLOOD: Lipase: 23 U/L (ref 11–51)

## 2023-12-31 LAB — HCG, SERUM, QUALITATIVE: Preg, Serum: NEGATIVE

## 2023-12-31 LAB — CBC WITH DIFFERENTIAL/PLATELET
Abs Immature Granulocytes: 0.04 K/uL (ref 0.00–0.07)
Basophils Absolute: 0 K/uL (ref 0.0–0.1)
Basophils Relative: 0 %
Eosinophils Absolute: 0 K/uL (ref 0.0–0.5)
Eosinophils Relative: 0 %
HCT: 36.8 % (ref 36.0–46.0)
Hemoglobin: 12.2 g/dL (ref 12.0–15.0)
Immature Granulocytes: 0 %
Lymphocytes Relative: 10 %
Lymphs Abs: 1.2 K/uL (ref 0.7–4.0)
MCH: 30.6 pg (ref 26.0–34.0)
MCHC: 33.2 g/dL (ref 30.0–36.0)
MCV: 92.2 fL (ref 80.0–100.0)
Monocytes Absolute: 0.4 K/uL (ref 0.1–1.0)
Monocytes Relative: 3 %
Neutro Abs: 10.1 K/uL — ABNORMAL HIGH (ref 1.7–7.7)
Neutrophils Relative %: 87 %
Platelets: 310 K/uL (ref 150–400)
RBC: 3.99 MIL/uL (ref 3.87–5.11)
RDW: 12.6 % (ref 11.5–15.5)
WBC: 11.7 K/uL — ABNORMAL HIGH (ref 4.0–10.5)
nRBC: 0 % (ref 0.0–0.2)

## 2023-12-31 LAB — TROPONIN I (HIGH SENSITIVITY): Troponin I (High Sensitivity): <2 ng/L (ref <18)

## 2023-12-31 MED ORDER — ONDANSETRON HCL 4 MG PO TABS: 4.0000 mg | ORAL_TABLET | Freq: Three times a day (TID) | ORAL | 0 refills | Status: AC | PRN

## 2023-12-31 MED ORDER — ONDANSETRON HCL 4 MG/2ML IJ SOLN
4.0000 mg | Freq: Once | INTRAMUSCULAR | Status: AC
  Administered 2023-12-31: 4 mg via INTRAVENOUS
  Filled 2023-12-31: qty 2

## 2023-12-31 MED ORDER — PANTOPRAZOLE SODIUM 20 MG PO TBEC: 20.0000 mg | DELAYED_RELEASE_TABLET | Freq: Every day | ORAL | 0 refills | Status: AC

## 2023-12-31 MED ORDER — SODIUM CHLORIDE 0.9 % IV BOLUS
1000.0000 mL | Freq: Once | INTRAVENOUS | Status: AC
  Administered 2023-12-31: 1000 mL via INTRAVENOUS

## 2023-12-31 MED ORDER — FAMOTIDINE IN NACL 20-0.9 MG/50ML-% IV SOLN
20.0000 mg | Freq: Once | INTRAVENOUS | Status: AC
  Administered 2023-12-31: 20 mg via INTRAVENOUS
  Filled 2023-12-31: qty 50

## 2023-12-31 NOTE — ED Provider Notes (Signed)
 [place] EM[facility]GENCY DEPARTMENT AT [facility] Provider Note   CSN: [id number] Arrival date & time: 12/31/23  1423     History {Add pertinent medical, surgical, social history, OB history to HPI:1} Chief Complaint  Patient presents with   Nausea   Emesis   Diarrhea    Since 4am    Rhonda [name] is a 33 y.o. female.  She is here with a complaint of persistent vomiting nausea and heartburn that started at 4 AM this morning.  She had she has vomited multiple times and now starting to vomit a little bit of blood.  Burning in his chest.  Feels short of breath and anxious due to it.  She did endorse eating late last night and having some alcohol.  Loose stools.   The history is provided by the patient.  Emesis Severity:  Moderate Duration:  12 hours Timing:  Constant Quality:  Stomach contents and bright red blood Progression:  Unchanged Chronicity:  New Associated symptoms: diarrhea and sore throat   Associated symptoms: no abdominal pain, no cough and no fever   Risk factors: alcohol use   Risk factors: not pregnant        Home Medications Prior to Admission medications   Medication Sig Start Date End Date Taking? Authorizing Provider  albuterol (VENTOLIN HFA) 108 (90 Base) MCG/ACT inhaler Inhale 2 puffs into the lungs every 6 (six) hours as needed for shortness of breath. Patient not taking: Reported on 04/04/2023 12/20/22   [provider]  FLUoxetine (PROZAC) 10 MG capsule Take 1 capsule (10 mg total) by mouth daily. 12/13/23 02/11/24  B[place]i, Sarah A  gabapentin (NEURONTIN) 100 MG capsule Take 1 capsule (100 mg total) by mouth 2 (two) times daily as needed (anxiety). 12/13/23 02/11/24  [place], Sarah A  QUEtiapine (SEROQUEL) 200 MG tablet Take 1 tablet (200 mg total) by mouth at bedtime. 12/13/23 03/12/24  [place], Sarah A  QUEtiapine (SEROQUEL) 25 MG tablet Take 1 tablet (25 mg total) by mouth 2 (two) times daily. 12/13/23 03/12/24  [place], Sarah A       Allergies    Patient has no known allergies.    Review of Systems   Review of Systems  Constitutional:  Negative for fever.  HENT:  Positive for sore throat.   Respiratory:  Negative for cough.   Cardiovascular:  Positive for chest pain.  Gastrointestinal:  Positive for diarrhea, nausea and vomiting. Negative for abdominal pain.  Genitourinary:  Negative for dysuria.    Physical Exam Updated Vital Signs BP 125/89 (BP Location: Right Arm)   Pulse 70   Temp 98.7 F (37.1 C) (Oral)   Resp 18   Ht 5\' 5"  (1.651 m)   Wt 88.5 kg   LMP 12/11/2023   SpO2 96%   BMI 32.45 kg/m  Physical Exam Vitals and nursing note reviewed.  Constitutional:      General: She is not in acute distress.    Appearance: Normal appearance. She is well-developed.  HENT:     Head: Normocephalic and atraumatic.  Eyes:     Conjunctiva/sclera: Conjunctivae normal.  Cardiovascular:     Rate and Rhythm: Normal rate and regular rhythm.     Heart sounds: No murmur heard. Pulmonary:     Effort: Pulmonary effort is normal. No respiratory distress.     Breath sounds: Normal breath sounds.  Abdominal:     Palpations: Abdomen is soft.     Tenderness: There is no abdominal tenderness. There is no  guarding or rebound.  Musculoskeletal:        General: No deformity.     Cervical back: Neck supple.  Skin:    General: Skin is warm and dry.     Capillary Refill: Capillary refill takes less than 2 seconds.  Neurological:     General: No focal deficit present.     Mental Status: She is alert.     Motor: No weakness.     ED Results / Procedures / Treatments   Labs (all labs ordered are listed, but only abnormal results are displayed) Labs Reviewed - No data to display  EKG None  Radiology No results found.  Procedures Procedures  {Document cardiac monitor, telemetry assessment procedure when appropriate:1}  Medications Ordered in ED Medications  sodium chloride 0.9 % bolus 1,000 mL (has no  administration in time range)  ondansetron (ZOFRAN) injection 4 mg (has no administration in time range)  famotidine (PEPCID) IVPB 20 mg premix (has no administration in time range)    ED Course/ Medical Decision Making/ A&P   {   Click here for ABCD2, HEART and other calculatorsREFRESH Note before signing :1}                              Medical Decision Making Amount and/or Complexity of Data Reviewed Labs: ordered.  Risk Prescription drug management.   This patient complains of ***; this involves an extensive number of treatment Options and is a complaint that carries with it a high risk of complications and morbidity. The differential includes ***  I ordered, reviewed and interpreted labs, which included *** I ordered medication *** and reviewed PMP when indicated. I ordered imaging studies which included *** and I independently    visualized and interpreted imaging which showed *** Additional history obtained from *** Previous records obtained and reviewed *** I consulted *** and discussed lab and imaging findings and discussed disposition.  Cardiac monitoring reviewed, *** Social determinants considered, *** Critical Interventions: ***  After the interventions stated above, I reevaluated the patient and found *** Admission and further testing considered, ***   {Document critical care time when appropriate:1} {Document review of labs and clinical decision tools ie heart score, Chads2Vasc2 etc:1}  {Document your independent review of radiology images, and any outside records:1} {Document your discussion with family members, caretakers, and with consultants:1} {Document social determinants of health affecting pt's care:1} {Document your decision making why or why not admission, treatments were needed:1} Final Clinical Impression(s) / ED Diagnoses Final diagnoses:  None    Rx / DC Orders ED Discharge Orders     None

## 2023-12-31 NOTE — ED Triage Notes (Signed)
 For home, CC N/V/D since 4am, can keep anything down.  No chest pain/ SOB  84 NS 119/80 CBG 143 22 Left Wrist  Given 4mg  zofran

## 2024-01-18 ENCOUNTER — Ambulatory Visit (INDEPENDENT_AMBULATORY_CARE_PROVIDER_SITE_OTHER): Payer: MEDICAID | Admitting: Clinical

## 2024-01-18 DIAGNOSIS — F411 Generalized anxiety disorder: Secondary | ICD-10-CM

## 2024-01-18 NOTE — Progress Notes (Signed)
 THERAPIST PROGRESS NOTE Virtual Visit via Video Note  I connected with Rhonda [name] on 01/18/24 at  2:00 PM EDT by a video enabled telemedicine application and verified that I am speaking with the correct person using two identifiers.  Location: Patient: home Provider: office   I discussed the limitations of evaluation and management by telemedicine and the availability of in person appointments. The patient expressed understanding and agreed to proceed.   Follow Up Instructions: I discussed the assessment and treatment plan with the patient. The patient was provided an opportunity to ask questions and all were answered. The patient agreed with the plan and demonstrated an understanding of the instructions.   The patient was advised to call back or seek an in-person evaluation if the symptoms worsen or if the condition fails to improve as anticipated.   Session Time: 40 minutes  Participation Level: Active  Behavioral Response: CasualAlertDepressed  Type of Therapy: Individual Therapy  Treatment Goals addressed: Patient will participate in at least 80% of scheduled individual psychotherapy sessions   ProgressTowards Goals: Progressing  Interventions: CBT and Supportive  Summary:  Rhonda [name] is a 33 y.o. female who presents for the scheduled appointment oriented x 5, appropriately dressed, and friendly.  Client denied hallucinations or delusions. Client reported on today she has not been in the best of mood.  Client reported she has been staying to herself and not answering any of the phone calls from any friends or family has been called to check on her.  Client reported on the 11th of this month was the anniversary of her brother's passing.  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  Client reported she is still numb to how she feels about his passing.  Client reported the man's son who  is also in the home has been making negative comments about waiting for his dad to pass.  Client reported she notes she has been feeling overstimulated and having some thoughts of disappointment by how people do not show up for her how she showed up for them.  Client reported she has been at the [facility] and will have her license back soon. Client reported her family is planning to move so they can all be closer together in the near future. Client reported she has continued to work out whether in the house or when she can go to the gym. Evidence of progress towards goal:  client reported 1 positive of having a active outlet for herself. Client reported 1 positive of being able to regulate her emotions to prevent her from saying something she would regret when she is upset.  Suicidal/Homicidal: Nowithout intent/plan  Therapist Response:  Therapist began the appointment asking the client how she has been doing since she was last seen. Therapist engaged with active listening and positive emotional support. Therapist used cbt to engage and ask the client about the source of her negative emotions. Therapist used cbt to engage and normalize the clients reaction to grief and trauma from past and current stressors. Therapist used cbt to discuss self care habits to cotinue using. Therapist used CBT ask the client to identify her progress with frequency of use with coping skills with continued practice in her daily activity.     Plan: Return again in 4 weeks.  Diagnosis: GAD  Collaboration of Care: Patient refused AEB none requested by the client.  Patient/Guardian was  advised Release of Information must be obtained prior to any record release in order to collaborate their care with an outside provider. Patient/Guardian was advised if they have not already done so to contact the registration department to sign all necessary forms in order for us  to release information regarding their care.   Consent:  Patient/Guardian gives verbal consent for treatment and assignment of benefits for services provided during this visit. Patient/Guardian expressed understanding and agreed to proceed.   Iriel Nason Y Yianni Skilling, LCSW 01/18/2024
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
## 2024-02-03 ENCOUNTER — Other Ambulatory Visit: Payer: Self-pay

## 2024-02-03 ENCOUNTER — Emergency Department (HOSPITAL_COMMUNITY)
Admission: EM | Admit: 2024-02-03 | Discharge: 2024-02-03 | Disposition: A | Discharge status: Home / Self Care | Payer: MEDICAID | Attending: Emergency Medicine | Admitting: Emergency Medicine

## 2024-02-03 ENCOUNTER — Encounter (HOSPITAL_COMMUNITY): Payer: Self-pay

## 2024-02-03 DIAGNOSIS — K0889 Other specified disorders of teeth and supporting structures: Secondary | ICD-10-CM | POA: Diagnosis present

## 2024-02-03 DIAGNOSIS — K047 Periapical abscess without sinus: Secondary | ICD-10-CM | POA: Diagnosis not present

## 2024-02-03 MED ORDER — OXYCODONE-ACETAMINOPHEN 5-325 MG PO TABS
1.0000 | ORAL_TABLET | Freq: Once | ORAL | Status: AC
  Administered 2024-02-03: 1 via ORAL
  Filled 2024-02-03: qty 1

## 2024-02-03 MED ORDER — ONDANSETRON 4 MG PO TBDP: 4.0000 mg | ORAL_TABLET | Freq: Three times a day (TID) | ORAL | 0 refills | Status: AC | PRN

## 2024-02-03 MED ORDER — AMOXICILLIN-POT CLAVULANATE 875-125 MG PO TABS
1.0000 | ORAL_TABLET | Freq: Once | ORAL | Status: AC
  Administered 2024-02-03: 1 via ORAL
  Filled 2024-02-03: qty 1

## 2024-02-03 MED ORDER — ONDANSETRON 4 MG PO TBDP
4.0000 mg | ORAL_TABLET | Freq: Once | ORAL | Status: AC
  Administered 2024-02-03: 4 mg via ORAL
  Filled 2024-02-03: qty 1

## 2024-02-03 MED ORDER — AMOXICILLIN-POT CLAVULANATE 875-125 MG PO TABS: 1.0000 | ORAL_TABLET | Freq: Two times a day (BID) | ORAL | 0 refills | Status: AC

## 2024-02-03 MED ORDER — OXYCODONE-ACETAMINOPHEN 5-325 MG PO TABS: 1.0000 | ORAL_TABLET | Freq: Four times a day (QID) | ORAL | 0 refills | Status: AC | PRN

## 2024-02-03 NOTE — ED Triage Notes (Signed)
 Left sided upper dental pain that started 2 days ago. Swelling noted, c/o headache

## 2024-02-03 NOTE — Discharge Instructions (Addendum)
 Your history, exam, and evaluation today are consistent with a dental infection causing the pain and mild swelling.  I did not see evidence of acute abscess that would be amenable to drainage externally but I do agree that you need antibiotics and medicine help with symptoms given your discomfort.  We agreed together to hold on significant lab testing or imaging today but do recommend using the medications and follow-up with dentistry as soon as possible.  Please rest and stay hydrated and use the pain and nausea medicine that was symptoms.  If any symptoms change or worsen acutely, please return to the nearest emergency department.

## 2024-02-03 NOTE — ED Provider Notes (Signed)
 [place] EMERGENCY DEPARTMENT AT [facility] Provider Note   CSN: [id number] Arrival date & time: 02/03/24  2042     History  Chief Complaint  Patient presents with   Dental Pain    Rhonda [name] is a 33 y.o. female.  The history is provided by the patient and medical records. No language interpreter was used.  Dental Pain Location:  Upper Upper teeth location:  12/LU 1st bicuspid Quality:  Aching Severity:  Moderate Onset quality:  Gradual Duration:  3 days Timing:  Constant Progression:  Worsening Context: dental caries and poor dentition   Context: not recent dental surgery and not trauma   Relieved by:  Nothing Worsened by:  Touching Ineffective treatments:  None tried Associated symptoms: facial swelling   Associated symptoms: no congestion, no difficulty swallowing, no facial pain, no fever, no headaches, no neck pain, no neck swelling, no oral bleeding, no oral lesions and no trismus        Home Medications Prior to Admission medications   Medication Sig Start Date End Date Taking? Authorizing Provider  albuterol  (VENTOLIN  HFA) 108 (90 Base) MCG/ACT inhaler Inhale 2 puffs into the lungs every 6 (six) hours as needed for shortness of breath. Patient not taking: Reported on 04/04/2023 12/20/22   [provider]  FLUoxetine  (PROZAC ) 10 MG capsule Take 1 capsule (10 mg total) by mouth daily. 12/13/23 02/11/24  Bahraini, Sarah A  gabapentin  (NEURONTIN ) 100 MG capsule Take 1 capsule (100 mg total) by mouth 2 (two) times daily as needed (anxiety). 12/13/23 02/11/24  Bahraini, Sarah A  ondansetron  (ZOFRAN ) 4 MG tablet Take 1 tablet (4 mg total) by mouth every 8 (eight) hours as needed for nausea or vomiting. 12/31/23   Tonya Fredrickson, MD  pantoprazole  (PROTONIX ) 20 MG tablet Take 1 tablet (20 mg total) by mouth daily. 12/31/23   Tonya Fredrickson, MD  QUEtiapine  (SEROQUEL ) 200 MG tablet Take 1 tablet (200 mg total) by mouth at bedtime. 12/13/23 03/12/24   Bahraini, Sarah A  QUEtiapine  (SEROQUEL ) 25 MG tablet Take 1 tablet (25 mg total) by mouth 2 (two) times daily. 12/13/23 03/12/24  Bahraini, Sarah A      Allergies    Patient has no known allergies.    Review of Systems   Review of Systems  Constitutional:  Negative for chills, fatigue and fever.  HENT:  Positive for facial swelling. Negative for congestion and mouth sores.   Eyes:  Negative for visual disturbance.  Respiratory:  Negative for cough, chest tightness and shortness of breath.   Cardiovascular:  Negative for chest pain.  Gastrointestinal:  Negative for abdominal pain, constipation, diarrhea, nausea and vomiting.  Genitourinary:  Negative for dysuria.  Musculoskeletal:  Negative for back pain and neck pain.  Skin:  Negative for rash and wound.  Neurological:  Negative for light-headedness and headaches.  Psychiatric/Behavioral:  Negative for agitation and confusion.   All other systems reviewed and are negative.   Physical Exam Updated Vital Signs BP (!) 128/97 (BP Location: Left Arm)   Pulse 96   Temp 98.8 F (37.1 C) (Oral)   Resp 18   Ht 5\' 5"  (1.651 m)   Wt 88.5 kg   SpO2 98%   BMI 32.45 kg/m  Physical Exam Vitals and nursing note reviewed.  Constitutional:      General: She is not in acute distress.    Appearance: She is well-developed. She is not ill-appearing, toxic-appearing or diaphoretic.  HENT:  Head: Atraumatic.     Nose: No congestion or rhinorrhea.     Mouth/Throat:     Mouth: Mucous membranes are moist.     Dentition: Abnormal dentition. Does not have dentures. Dental tenderness and dental caries present. No gum lesions.     Pharynx: No oropharyngeal exudate, posterior oropharyngeal erythema or uvula swelling.      Comments: No drainable abscess seen  Eyes:     Extraocular Movements: Extraocular movements intact.     Conjunctiva/sclera: Conjunctivae normal.     Pupils: Pupils are equal, round, and reactive to light.  Cardiovascular:      Rate and Rhythm: Normal rate and regular rhythm.     Heart sounds: No murmur heard. Pulmonary:     Effort: Pulmonary effort is normal. No respiratory distress.     Breath sounds: Normal breath sounds. No wheezing, rhonchi or rales.  Chest:     Chest wall: No tenderness.  Abdominal:     Palpations: Abdomen is soft.     Tenderness: There is no abdominal tenderness.  Musculoskeletal:        General: No swelling.     Cervical back: Neck supple.  Skin:    General: Skin is warm and dry.     Capillary Refill: Capillary refill takes less than 2 seconds.     Findings: No erythema or rash.  Neurological:     General: No focal deficit present.     Mental Status: She is alert.  Psychiatric:        Mood and Affect: Mood normal.     ED Results / Procedures / Treatments   Labs (all labs ordered are listed, but only abnormal results are displayed) Labs Reviewed - No data to display  EKG None  Radiology No results found.  Procedures Procedures    Medications Ordered in ED Medications  amoxicillin-clavulanate (AUGMENTIN) 875-125 MG per tablet 1 tablet (1 tablet Oral Given 02/03/24 2217)  oxyCODONE -acetaminophen  (PERCOCET/ROXICET) 5-325 MG per tablet 1 tablet (1 tablet Oral Given 02/03/24 2217)  ondansetron  (ZOFRAN -ODT) disintegrating tablet 4 mg (4 mg Oral Given 02/03/24 2217)    ED Course/ Medical Decision Making/ A&P                                 Medical Decision Making   Rhonda [name] is a 33 y.o. female with past medical history significant for anxiety, depression, and PTSD who presents with dental problem.  According to patient, for the last few days she has had pain in her left upper jaw and feels like she has a dental infection.  She tried to see a dentist but has not been able to get in.  She reports no fevers or chills but is having the pain in her left upper jaw going towards her cheek.  She denies any acute vision changes at this time and denies any neck pain or neck  stiffness.  She reports some chronic left knee discomfort but she denies any other swelling.  She is still able to ambulate well.  She reports it sometimes bothers her after the gym.  She otherwise denies nausea, vomiting, constipation, diarrhea, urinary changes.  Does not report difficulty swallowing or breathing.  Reports that her dental pain is causing some headache.  On exam, lungs clear.  Chest nontender.  Abdomen nontender.  Vital signs reassuring initially.  Oropharyngeal exam showed no evidence of PTA or RPA and patient did not  have trismus.  She had some tenderness to her left upper jaw and she has evidence of dental caries.  I did not see an abscess that was amenable to drainage at this time.  Otherwise she had some cheek tenderness with very mild swelling.  Pupils are symmetric and reactive with normal extract movements.  No evidence of otitis media or otitis externa.  No other tenderness in the mastoid or the rest of the neck.  Exam otherwise unremarkable.  We had a shared decision-making conversation and agreed to give her prescription for antibiotics, pain medicine, nausea medicine here.  Will give her prescriptions for these.  We did not feel that she needed labs and CT imaging at this time as she has not yet failed outpatient medications however she was encouraged to call dentistry soon as possible to get seen in the next few days.  Patient agrees with this plan.  She understands return precautions if anything was change or worsen that she may need imaging and labs if that was the case.  She had other questions or concerns and was discharged in good condition with prescriptions and with plan for outpatient management.         Final Clinical Impression(s) / ED Diagnoses Final diagnoses:  Pain, dental  Dental infection    Rx / DC Orders ED Discharge Orders          Ordered    amoxicillin-clavulanate (AUGMENTIN) 875-125 MG tablet  Every 12 hours        02/03/24 2214     oxyCODONE -acetaminophen  (PERCOCET/ROXICET) 5-325 MG tablet  Every 6 hours PRN        02/03/24 2214    ondansetron  (ZOFRAN -ODT) 4 MG disintegrating tablet  Every 8 hours PRN        02/03/24 2214            Clinical Impression: 1. Pain, dental   2. Dental infection     Disposition: Discharge  Condition: Good  I have discussed the results, Dx and Tx plan with the pt(& family if present). He/she/they expressed understanding and agree(s) with the plan. Discharge instructions discussed at great length. Strict return precautions discussed and pt &/or family have verbalized understanding of the instructions. No further questions at time of discharge.    Discharge Medication List as of 02/03/2024 10:18 PM     START taking these medications   Details  amoxicillin-clavulanate (AUGMENTIN) 875-125 MG tablet Take 1 tablet by mouth every 12 (twelve) hours for 7 days., Starting Sat 02/03/2024, Until Sat 02/10/2024, Normal    ondansetron  (ZOFRAN -ODT) 4 MG disintegrating tablet Take 1 tablet (4 mg total) by mouth every 8 (eight) hours as needed for nausea or vomiting., Starting Sat 02/03/2024, Normal    oxyCODONE -acetaminophen  (PERCOCET/ROXICET) 5-325 MG tablet Take 1 tablet by mouth every 6 (six) hours as needed for severe pain (pain score 7-10)., Starting Sat 02/03/2024, Normal        Follow Up: No follow-up provider specified.     Trajon Rosete, Marine Sia, MD 02/03/24 2225

## 2024-02-05 ENCOUNTER — Encounter (HOSPITAL_COMMUNITY): Payer: Self-pay | Admitting: Psychiatry

## 2024-02-05 ENCOUNTER — Telehealth (INDEPENDENT_AMBULATORY_CARE_PROVIDER_SITE_OTHER): Payer: MEDICAID | Admitting: Psychiatry

## 2024-02-05 DIAGNOSIS — F4310 Post-traumatic stress disorder, unspecified: Secondary | ICD-10-CM | POA: Diagnosis not present

## 2024-02-05 DIAGNOSIS — F411 Generalized anxiety disorder: Secondary | ICD-10-CM | POA: Diagnosis not present

## 2024-02-05 DIAGNOSIS — F332 Major depressive disorder, recurrent severe without psychotic features: Secondary | ICD-10-CM | POA: Diagnosis not present

## 2024-02-05 DIAGNOSIS — F1290 Cannabis use, unspecified, uncomplicated: Secondary | ICD-10-CM | POA: Diagnosis not present

## 2024-02-05 MED ORDER — QUETIAPINE FUMARATE 200 MG PO TABS: 200.0000 mg | ORAL_TABLET | Freq: Every day | ORAL | 2 refills | Status: DC

## 2024-02-05 NOTE — Patient Instructions (Signed)
 Thank you for attending your appointment today.  -- Continue nightly Seroquel ; if needed before the next appointment you may restart Seroquel  25 mg up to twice daily as needed for acute anxiety -- Continue other medications as prescribed.  Please do not make any changes to medications without first discussing with your provider. If you are experiencing a psychiatric emergency, please call 911 or present to your nearest emergency department. Additional crisis, medication management, and therapy resources are included below.  [facility]  [address], [place], [place] [address] [phone] WALK-IN URGENT CARE 24/7 FOR ANYONE [address], [place], [place]  [phone] Fax: [phone] [email or web address] *Interpreters available *Accepts all insurance and uninsured for Urgent Care needs *Accepts Medicaid and uninsured for outpatient treatment (below)      ONLY FOR [facility]  Below:    Outpatient New Patient Assessment/Therapy Walk-ins:        Monday, Wednesday, and Thursday 8am until slots are full (first come, first served)                   New Patient Psychiatry/Medication Management        Monday-Friday 8am-11am (first come, first served)               For all walk-ins we ask that you arrive by 7:15am, because patients will be seen in the order of arrival.

## 2024-02-08 ENCOUNTER — Ambulatory Visit (HOSPITAL_COMMUNITY): Payer: MEDICAID | Admitting: Clinical

## 2024-02-08 ENCOUNTER — Encounter (HOSPITAL_COMMUNITY): Payer: Self-pay

## 2024-02-29 ENCOUNTER — Ambulatory Visit (INDEPENDENT_AMBULATORY_CARE_PROVIDER_SITE_OTHER): Payer: MEDICAID | Admitting: Clinical

## 2024-02-29 DIAGNOSIS — F411 Generalized anxiety disorder: Secondary | ICD-10-CM

## 2024-03-01 NOTE — Progress Notes (Signed)
 Rhonda [name] is a 33 y.o. female patient presented to the appointment oriented times five, appropriately dressed and cooperative.  Client denied hallucinations and delusions.  Client reported on today she got her dates mixed up and she needs to reschedule her therapy appointment.  Client reported she has no urgent concerns at this time.     Collaboration of Care: Other therapist will communicate with the administrative staff to have the client rescheduled.  Patient/Guardian was advised Release of Information must be obtained prior to any record release in order to collaborate their care with an outside provider. Patient/Guardian was advised if they have not already done so to contact the registration department to sign all necessary forms in order for us  to release information regarding their care.   Consent: Patient/Guardian gives verbal consent for treatment and assignment of benefits for services provided during this visit. Patient/Guardian expressed understanding and agreed to proceed.    Brookes Craine Y Elisheva Fallas, LCSW

## 2024-04-03 NOTE — Progress Notes (Signed)
 Patient did not connect for virtual psychiatric medication management appointment on 04/08/24 at 10:30AM. Sent secure video link with no response. Left VM with callback number to reschedule.  LAURAINE DELENA PUMMEL, MD 04/08/24

## 2024-04-08 ENCOUNTER — Encounter (HOSPITAL_COMMUNITY): Payer: MEDICAID | Admitting: Psychiatry

## 2024-04-08 ENCOUNTER — Encounter (HOSPITAL_COMMUNITY): Payer: Self-pay

## 2024-04-18 ENCOUNTER — Encounter (HOSPITAL_COMMUNITY): Payer: Self-pay

## 2024-04-18 ENCOUNTER — Ambulatory Visit (HOSPITAL_COMMUNITY): Payer: MEDICAID | Admitting: Clinical

## 2024-05-23 ENCOUNTER — Ambulatory Visit (INDEPENDENT_AMBULATORY_CARE_PROVIDER_SITE_OTHER): Payer: MEDICAID | Admitting: Clinical

## 2024-05-23 DIAGNOSIS — F411 Generalized anxiety disorder: Secondary | ICD-10-CM

## 2024-05-23 NOTE — Progress Notes (Signed)
 THERAPIST PROGRESS NOTE Virtual Visit via Video Note  I connected with Rhonda [name] on 05/23/2024 at  3:00 PM EDT by a video enabled telemedicine application and verified that I am speaking with the correct person using two identifiers.  Location: Patient: home Provider: office   I discussed the limitations of evaluation and management by telemedicine and the availability of in person appointments. The patient expressed understanding and agreed to proceed.   Follow Up Instructions: I discussed the assessment and treatment plan with the patient. The patient was provided an opportunity to ask questions and all were answered. The patient agreed with the plan and demonstrated an understanding of the instructions.   The patient was advised to call back or seek an in-person evaluation if the symptoms worsen or if the condition fails to improve as anticipated.   Session Time: 40 min  Participation Level: Active  Behavioral Response: CasualAlertEuthymic  Type of Therapy: Individual Therapy  Treatment Goals addressed: client will participate in at least 80% of scheduled individual psychotherapy sessions  ProgressTowards Goals: Progressing  Interventions: CBT  Summary:  Rhonda [name] is a 33 y.o. female who presents for scheduled appointment oriented x 5, appropriately dressed, and friendly.  Client denied hallucinations and delusions. Client reported all today she is doing pretty well.  Client reported a lot has changed.  Client reported she is no longer staying at the house where she was helping to care give for an elderly man his son.  Client reported things ended on good times with the son.  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  Client reported otherwise transitioning in her life and positive ways.  Client reported she [place] because being in  [place]  is lonely.  Client reported it has been stressful but she is happy that she is taking the time out now to finish her college degree and move forward.  Client reported she has been a lot of healing from the trauma that she has been through and by.  Client reported she still has anxiety but she is finding her ways to be able to manage that. Evidence of progress towards goal:  client reported 1 positive of working on her goals for education.  Suicidal/Homicidal: Nowithout intent/plan  Therapist Response:  Therapist began the appointment asking how she has been doing. Therapist engaged with active listening and positive emotional support. Therapist used cbt to engage and ask about changes. Therapist used cbt to positively reinforce the clients problem solving and mindfully coping with thoughts of anxiety constructively using family support. Therapist used CBT ask the client to identify her progress with frequency of use with coping skills with continued practice in her daily activity.      Plan: Return again in 4 weeks.  Diagnosis: GAD  Collaboration of Care: Patient refused AEB none requested by the client.  Patient/Guardian was advised Release of Information must be obtained prior to any record release in order to collaborate their care with an outside provider. Patient/Guardian was advised if they have not already done so to contact the registration department to sign all necessary forms in order for us  to release information regarding their care.   Consent: Patient/Guardian gives verbal consent for treatment and assignment of benefits for services provided during this visit. Patient/Guardian expressed understanding and agreed to proceed.   Lashawnda Hancox Y Shriley Joffe, LCSW 05/23/2024

## 2024-06-19 ENCOUNTER — Ambulatory Visit (INDEPENDENT_AMBULATORY_CARE_PROVIDER_SITE_OTHER): Payer: MEDICAID | Admitting: Clinical

## 2024-06-19 DIAGNOSIS — F331 Major depressive disorder, recurrent, moderate: Secondary | ICD-10-CM | POA: Diagnosis not present

## 2024-06-19 NOTE — Progress Notes (Signed)
 THERAPIST PROGRESS NOTE Virtual Visit via Video Note  I connected with Rhonda [name] on 06/19/2024 at  3:00 PM EDT by a video enabled telemedicine application and verified that I am speaking with the correct person using two identifiers.  Location: Patient: home Provider: office   I discussed the limitations of evaluation and management by telemedicine and the availability of in person appointments. The patient expressed understanding and agreed to proceed.   Follow Up Instructions:  I discussed the assessment and treatment plan with the patient. The patient was provided an opportunity to ask questions and all were answered. The patient agreed with the plan and demonstrated an understanding of the instructions.   The patient was advised to call back or seek an in-person evaluation if the symptoms worsen or if the condition fails to improve as anticipated.    Session Time: 40 min  Participation Level: Active  Behavioral Response: CasualAlertEuthymic  Type of Therapy: Individual Therapy  Treatment Goals addressed: client will engage in at least 80% of scheduled individual psychotherapy sessions  ProgressTowards Goals: Progressing  Interventions: CBT and Supportive  Summary:  Rhonda [name] is a 33 y.o. female who presents for scheduled appointment oriented x 5, appropriately dressed, and friendly.  Client denied hallucinations and delusions. Client reported on today she has been doing pretty well but having some mixed emotions thinking about family.  Client reported her younger sister is expecting a baby later this year in November.  Client reported overall her mother is getting older.  Client reported her family is really trying to persuade her to move back to [place] .  Client reported she has mixed emotions because of all her family has been through they put a large emphasis on everybody being close knit.  Client reported however she is always the one who has to pack up everything  and go to them but nobody comes out to her.  Client reported also struggling with some thoughts of feeling guilty if she did not go back home.  Client reported she is in a good space right now with attending school and excelling.  Client reported she is making some good networking opportunities.  Client reported she gets to herself still living in [place] .  Client reported she will go visit her family during her Christmas break. Evidence of progress towards goal: Client reported 1 positive which is processing her emotions and evaluating the pros and cons before making any major decisions.  Suicidal/Homicidal: Nowithout intent/plan  Therapist Response:  Therapist began the appointment asking the client how she has been doing since last seen. Therapist engaged with active listening and positive emotional support. Therapist used CBT to get the client time to discuss her thoughts and feelings related to her perspective on life, family, and her overall concerns. Therapist used CBT to normalize her thoughts and emotions within reason and collaborate with her to brainstorm short-term solutions so she does not get overwhelmed thinking about the future. Therapist used CBT ask the client to identify her progress with frequency of use with coping skills with continued practice in her daily activity.    Therapist assigned client homework to practice self-care.   Plan: Return again in 4 weeks.  Diagnosis: moderate recurrent major depressive disorder  Collaboration of Care: Patient refused AEB none requested.  Patient/Guardian was advised Release of Information must be obtained prior to any record release in order to collaborate their care with an outside provider. Patient/Guardian was advised if they have not already done so to  contact the registration department to sign all necessary forms in order for us  to release information regarding their care.   Consent: Patient/Guardian gives verbal consent  for treatment and assignment of benefits for services provided during this visit. Patient/Guardian expressed understanding and agreed to proceed.   Keeghan Bialy Y Javan Gonzaga, LCSW 06/19/2024

## 2024-07-03 ENCOUNTER — Encounter (HOSPITAL_COMMUNITY): Payer: Self-pay

## 2024-07-03 ENCOUNTER — Ambulatory Visit (HOSPITAL_COMMUNITY): Payer: MEDICAID | Admitting: Clinical

## 2024-07-12 ENCOUNTER — Other Ambulatory Visit: Payer: Self-pay | Admitting: Medical Genetics

## 2024-07-12 ENCOUNTER — Encounter: Payer: Self-pay | Admitting: *Deleted

## 2024-07-12 DIAGNOSIS — Z006 Encounter for examination for normal comparison and control in clinical research program: Secondary | ICD-10-CM

## 2024-07-24 ENCOUNTER — Ambulatory Visit (HOSPITAL_COMMUNITY): Payer: MEDICAID | Admitting: Clinical

## 2024-07-24 ENCOUNTER — Encounter (HOSPITAL_COMMUNITY): Payer: Self-pay

## 2024-08-21 ENCOUNTER — Encounter (HOSPITAL_COMMUNITY): Payer: Self-pay

## 2024-08-21 ENCOUNTER — Ambulatory Visit (HOSPITAL_COMMUNITY): Payer: MEDICAID | Admitting: Clinical

## 2024-09-30 ENCOUNTER — Telehealth (HOSPITAL_COMMUNITY): Payer: Self-pay | Admitting: Psychiatry

## 2024-09-30 ENCOUNTER — Other Ambulatory Visit (HOSPITAL_COMMUNITY): Payer: Self-pay | Admitting: Student

## 2024-09-30 DIAGNOSIS — F4310 Post-traumatic stress disorder, unspecified: Secondary | ICD-10-CM

## 2024-09-30 MED ORDER — QUETIAPINE FUMARATE 200 MG PO TABS: 200.0000 mg | ORAL_TABLET | Freq: Every day | ORAL | 2 refills | Status: DC

## 2024-09-30 NOTE — Progress Notes (Signed)
 Patient followed regularly with Dr. Mercy for some time. Has not been seen in several months but is scheduled for a new appt with Dr. Izella. Will restart nighttime Seroquel  per request.

## 2024-10-04 ENCOUNTER — Telehealth (HOSPITAL_COMMUNITY): Payer: Self-pay
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
## 2024-10-09 NOTE — Telephone Encounter (Signed)
 Open in error

## 2024-10-15 ENCOUNTER — Telehealth (INDEPENDENT_AMBULATORY_CARE_PROVIDER_SITE_OTHER): Payer: MEDICAID | Admitting: Psychiatry

## 2024-10-15 DIAGNOSIS — F411 Generalized anxiety disorder: Secondary | ICD-10-CM

## 2024-10-15 DIAGNOSIS — F4310 Post-traumatic stress disorder, unspecified: Secondary | ICD-10-CM

## 2024-10-15 DIAGNOSIS — F332 Major depressive disorder, recurrent severe without psychotic features: Secondary | ICD-10-CM | POA: Diagnosis not present

## 2024-10-15 DIAGNOSIS — G4700 Insomnia, unspecified: Secondary | ICD-10-CM | POA: Insufficient documentation

## 2024-10-15 MED ORDER — ARIPIPRAZOLE 2 MG PO TABS: ORAL_TABLET | ORAL | 0 refills | Status: AC

## 2024-10-15 MED ORDER — QUETIAPINE FUMARATE 100 MG PO TABS: ORAL_TABLET | ORAL | 0 refills | Status: AC

## 2024-10-15 NOTE — Patient Instructions (Addendum)
--   decrease seroquel  to 100 mg for one week -> decrease seroquel  to 50 mg   -- start abilify  2 mg for one week -> increase abilify  to 5 mg for one week

## 2024-10-29 ENCOUNTER — Other Ambulatory Visit (HOSPITAL_COMMUNITY): Payer: Self-pay
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
## 2024-11-19 ENCOUNTER — Telehealth (HOSPITAL_COMMUNITY): Payer: MEDICAID | Admitting: Psychiatry
